# Patient Record
Sex: Female | Born: 1995 | Race: White | Hispanic: No | Marital: Single | Smoking: Former smoker
Health system: Southern US, Community
[De-identification: ages and names within clinical notes are randomized; demographics above are authoritative.]

## PROBLEM LIST (undated history)

## (undated) ENCOUNTER — Inpatient Hospital Stay (HOSPITAL_COMMUNITY): Payer: Self-pay

## (undated) DIAGNOSIS — R45851 Suicidal ideations: Secondary | ICD-10-CM

## (undated) DIAGNOSIS — F1311 Sedative, hypnotic or anxiolytic abuse, in remission: Secondary | ICD-10-CM

## (undated) DIAGNOSIS — F909 Attention-deficit hyperactivity disorder, unspecified type: Secondary | ICD-10-CM

## (undated) DIAGNOSIS — F1011 Alcohol abuse, in remission: Secondary | ICD-10-CM

## (undated) DIAGNOSIS — F4310 Post-traumatic stress disorder, unspecified: Secondary | ICD-10-CM

## (undated) DIAGNOSIS — F319 Bipolar disorder, unspecified: Secondary | ICD-10-CM

## (undated) DIAGNOSIS — T7840XA Allergy, unspecified, initial encounter: Secondary | ICD-10-CM

## (undated) DIAGNOSIS — O09299 Supervision of pregnancy with other poor reproductive or obstetric history, unspecified trimester: Secondary | ICD-10-CM

## (undated) HISTORY — PX: NO PAST SURGERIES: SHX2092

## (undated) HISTORY — DX: Bipolar disorder, unspecified: F31.9

## (undated) HISTORY — DX: Suicidal ideations: R45.851

## (undated) HISTORY — DX: Post-traumatic stress disorder, unspecified: F43.10

## (undated) HISTORY — DX: Attention-deficit hyperactivity disorder, unspecified type: F90.9

## (undated) HISTORY — DX: Allergy, unspecified, initial encounter: T78.40XA

## (undated) HISTORY — DX: Alcohol abuse, in remission: F10.11

## (undated) HISTORY — DX: Sedative, hypnotic or anxiolytic abuse, in remission: F13.11

---
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
## 2013-05-28 ENCOUNTER — Other Ambulatory Visit: Payer: Self-pay | Admitting: Family Medicine

## 2013-06-27 ENCOUNTER — Telehealth: Payer: Self-pay | Admitting: Family Medicine

## 2013-06-27 NOTE — Telephone Encounter (Signed)
Pt called and mother informed that pt is missing shots. MOther states that she will call to make appointment for shots and tb test. Wyatt Haste, RN-BSN

## 2013-06-27 NOTE — Telephone Encounter (Signed)
Pt is requesting a copy of their shot records be left up front for pickup. JW

## 2013-06-30 ENCOUNTER — Ambulatory Visit (INDEPENDENT_AMBULATORY_CARE_PROVIDER_SITE_OTHER): Payer: Medicaid Other | Admitting: *Deleted

## 2013-06-30 DIAGNOSIS — Z111 Encounter for screening for respiratory tuberculosis: Secondary | ICD-10-CM

## 2013-06-30 NOTE — Progress Notes (Signed)
PPD Placement note Veronica Moore, 17 y.o. female is here today for placement of PPD test Reason for PPD test: college Pt taken PPD test before: yes Verified in allergy area and with patient that they are not allergic to the products PPD is made of (Phenol or Tween). Yes Is patient taking any oral or IV steroid medication now or have they taken it in the last month? no Has the patient ever received the BCG vaccine?: no Has the patient been in recent contact with anyone known or suspected of having active TB disease?: no      O: Alert and oriented in NAD. P:  PPD placed on 06/30/2013.  Patient advised to return for reading within 48-72 hours.  Also dropped off forms to be filled out and will get PPD read & any needed shots on Monday at appointment with Dr. Konrad Dolores . Wyatt Haste, RN-BSN

## 2013-07-03 ENCOUNTER — Encounter: Payer: Self-pay | Admitting: Family Medicine

## 2013-07-03 ENCOUNTER — Ambulatory Visit (INDEPENDENT_AMBULATORY_CARE_PROVIDER_SITE_OTHER): Payer: Medicaid Other | Admitting: Family Medicine

## 2013-07-03 VITALS — BP 122/81 | HR 65 | Temp 98.4°F | Wt 105.0 lb

## 2013-07-03 DIAGNOSIS — F32A Depression, unspecified: Secondary | ICD-10-CM

## 2013-07-03 DIAGNOSIS — F988 Other specified behavioral and emotional disorders with onset usually occurring in childhood and adolescence: Secondary | ICD-10-CM

## 2013-07-03 DIAGNOSIS — Z23 Encounter for immunization: Secondary | ICD-10-CM

## 2013-07-03 DIAGNOSIS — Z309 Encounter for contraceptive management, unspecified: Secondary | ICD-10-CM

## 2013-07-03 DIAGNOSIS — F3289 Other specified depressive episodes: Secondary | ICD-10-CM

## 2013-07-03 DIAGNOSIS — L709 Acne, unspecified: Secondary | ICD-10-CM

## 2013-07-03 DIAGNOSIS — F329 Major depressive disorder, single episode, unspecified: Secondary | ICD-10-CM

## 2013-07-03 DIAGNOSIS — Z0000 Encounter for general adult medical examination without abnormal findings: Secondary | ICD-10-CM | POA: Insufficient documentation

## 2013-07-03 DIAGNOSIS — L708 Other acne: Secondary | ICD-10-CM

## 2013-07-03 DIAGNOSIS — Z00129 Encounter for routine child health examination without abnormal findings: Secondary | ICD-10-CM

## 2013-07-03 DIAGNOSIS — IMO0001 Reserved for inherently not codable concepts without codable children: Secondary | ICD-10-CM

## 2013-07-03 MED ORDER — METHYLPHENIDATE HCL ER (OSM) 18 MG PO TBCR: 18.0000 mg | EXTENDED_RELEASE_TABLET | Freq: Every day | ORAL | Status: DC

## 2013-07-03 MED ORDER — ADAPALENE 0.1 % EX CREA: TOPICAL_CREAM | Freq: Every day | CUTANEOUS | Status: DC

## 2013-07-03 NOTE — Progress Notes (Signed)
  Subjective:     History was provided by the mother and Pt.  Veronica Moore is a 17 y.o. female who is here for this wellness visit.   Current Issues: Current concerns include:None  Acne: using spot benzoyl peroxide PRN. Worse w/ periods.  Depression: no current therapy. Healthy home and friend relationships. No complaints. Denies HI/SI  ADD: not taking during the summer. Only takes during school year. Needs refills.    H (Home) Family Relationships: good Communication: good with parents Responsibilities: has responsibilities at home  E (Education): Grades: As and Bs School: good attendance Future Plans: college, Arts administrator   A (Activities) Sports: no sports Exercise: Yes  Activities: drama Friends: Yes   A (Auton/Safety) Auto: wears seat belt Bike: does not ride Safety: can swim  D (Diet) Diet: balanced diet Risky eating habits: none Intake: adequate iron and calcium intake Body Image: positive body image  Drugs Tobacco: No Alcohol: No Drugs: No  Sex Activity: sexually active  Suicide Risk Emotions: healthy Depression: denies feelings of depression Suicidal: denies suicidal ideation     Objective:     Filed Vitals:   07/03/13 1030  BP: 122/81  Pulse: 65  Temp: 98.4 F (36.9 C)  TempSrc: Oral  Weight: 105 lb (47.628 kg)   Growth parameters are noted and are appropriate for age.  General:   alert and cooperative  Gait:   normal  Skin:   closed and open commodomal acne on face  Oral cavity:   lips, mucosa, and tongue normal; teeth and gums normal  Eyes:   sclerae white, pupils equal and reactive, red reflex normal bilaterally  Ears:   normal bilaterally  Neck:   normal  Lungs:  clear to auscultation bilaterally  Heart:   regular rate and rhythm, S1, S2 normal, no murmur, click, rub or gallop  Abdomen:  soft, non-tender; bowel sounds normal; no masses,  no organomegaly  GU:  not examined  Extremities:   extremities normal, atraumatic, no  cyanosis or edema  Neuro:  normal without focal findings, mental status, speech normal, alert and oriented x3 and PERLA     Assessment:    Healthy 17 y.o. female child.    Plan:   1. Anticipatory guidance discussed. Nutrition, Physical activity, Behavior, Emergency Care, Sick Care, Safety and Handout given  2. Follow-up visit in 12 months for next wellness visit, or sooner as needed.   3. Spent considerable time discussing healthy eating habits, safe sex, depression, and Acne. Pt w/ excellent support at home. Appears to be stable from a nutrtion standpoint. Reviewed healthy eating habits, and exercise. Counseled on safe sex as not using any barrier method to prevent STD. No signs of worsening depression. Acne - see problem list

## 2013-07-03 NOTE — Patient Instructions (Addendum)
You are doing great Congratulations on getting into [employer]. Please start using the Differin cream at night. You may continue using the Bonzoyl peroxide treatments during the day Please call if this is not working for the acne or if you need anything else Best of luck.

## 2013-07-03 NOTE — Assessment & Plan Note (Signed)
Doesn't take during summer. Will start back up PRN once starting at [employer] [facility] [employer] [facility]

## 2013-07-03 NOTE — Assessment & Plan Note (Signed)
Varicella titer for school.

## 2013-07-03 NOTE — Assessment & Plan Note (Signed)
Cont OCP

## 2013-07-03 NOTE — Assessment & Plan Note (Signed)
Cont Benzoyl peroxide. Would like better control Will start Differin.

## 2013-07-03 NOTE — Assessment & Plan Note (Signed)
Pt starting [employer] this fall Discussed warning signs of depression.  Pt in longterm relationship with BF who is also going to [employer] Pt w/ excellent support at home.  Pt reassured that she can come in if any concerns arise.  No Rx at this time

## 2013-07-04 ENCOUNTER — Telehealth: Payer: Self-pay | Admitting: *Deleted

## 2013-07-04 LAB — VARICELLA ZOSTER ANTIBODY, IGG: Varicella IgG: 1446 Index — ABNORMAL HIGH (ref <135.00)

## 2013-07-04 NOTE — Telephone Encounter (Signed)
[facility]alene .Form placed in doctor's box for completion, attached are the preferred drugs. Will have md return form after completion to me. Wyatt Haste, RN-BSN

## 2013-07-06 MED ORDER — ADAPALENE 0.1 % EX CREA: TOPICAL_CREAM | Freq: Every day | CUTANEOUS | Status: DC

## 2013-07-06 NOTE — Addendum Note (Signed)
Addended by: Ozella Rocks on: 07/06/2013 12:53 PM   Modules accepted: Orders

## 2013-07-06 NOTE — Telephone Encounter (Signed)
Waiting to receive back from MD. Wyatt Haste, RN-BSN

## 2013-07-06 NOTE — Telephone Encounter (Signed)
Mom is calling to check the status of the auth for this medication.

## 2013-07-07 NOTE — Telephone Encounter (Signed)
Mother called and informed. Wyatt Haste, RN-BSN

## 2013-07-07 NOTE — Telephone Encounter (Signed)
Sent new Rx for brand name Deffirin as this is covered by Medicaid.  Sent on 07/05/13.  Should be at pharmacy

## 2013-10-24 ENCOUNTER — Encounter: Payer: Self-pay | Admitting: Emergency Medicine

## 2015-12-02 ENCOUNTER — Emergency Department (HOSPITAL_COMMUNITY)
Admission: EM | Admit: 2015-12-02 | Discharge: 2015-12-02 | Disposition: A | Discharge status: Other Acute Inpatient Hospital | Payer: Medicaid Other | Attending: Emergency Medicine | Admitting: Emergency Medicine

## 2015-12-02 ENCOUNTER — Encounter (HOSPITAL_COMMUNITY): Payer: Self-pay | Admitting: *Deleted

## 2015-12-02 ENCOUNTER — Encounter (HOSPITAL_COMMUNITY): Payer: Self-pay | Admitting: Emergency Medicine

## 2015-12-02 ENCOUNTER — Inpatient Hospital Stay (HOSPITAL_COMMUNITY)
Admission: AD | Admit: 2015-12-02 | Discharge: 2015-12-10 | DRG: 885 | Disposition: A | Discharge status: Home / Self Care | Payer: BC Managed Care – PPO | Attending: Psychiatry | Admitting: Psychiatry

## 2015-12-02 DIAGNOSIS — F332 Major depressive disorder, recurrent severe without psychotic features: Secondary | ICD-10-CM | POA: Diagnosis present

## 2015-12-02 DIAGNOSIS — F419 Anxiety disorder, unspecified: Secondary | ICD-10-CM | POA: Diagnosis present

## 2015-12-02 DIAGNOSIS — F4310 Post-traumatic stress disorder, unspecified: Secondary | ICD-10-CM | POA: Diagnosis present

## 2015-12-02 DIAGNOSIS — F1210 Cannabis abuse, uncomplicated: Secondary | ICD-10-CM | POA: Diagnosis present

## 2015-12-02 DIAGNOSIS — F17210 Nicotine dependence, cigarettes, uncomplicated: Secondary | ICD-10-CM | POA: Diagnosis present

## 2015-12-02 DIAGNOSIS — Z818 Family history of other mental and behavioral disorders: Secondary | ICD-10-CM

## 2015-12-02 DIAGNOSIS — F1010 Alcohol abuse, uncomplicated: Secondary | ICD-10-CM | POA: Diagnosis present

## 2015-12-02 DIAGNOSIS — Y9389 Activity, other specified: Secondary | ICD-10-CM | POA: Insufficient documentation

## 2015-12-02 DIAGNOSIS — R45851 Suicidal ideations: Secondary | ICD-10-CM | POA: Insufficient documentation

## 2015-12-02 DIAGNOSIS — Y998 Other external cause status: Secondary | ICD-10-CM | POA: Insufficient documentation

## 2015-12-02 DIAGNOSIS — Y9289 Other specified places as the place of occurrence of the external cause: Secondary | ICD-10-CM | POA: Insufficient documentation

## 2015-12-02 DIAGNOSIS — F1310 Sedative, hypnotic or anxiolytic abuse, uncomplicated: Secondary | ICD-10-CM | POA: Diagnosis present

## 2015-12-02 DIAGNOSIS — F909 Attention-deficit hyperactivity disorder, unspecified type: Secondary | ICD-10-CM | POA: Insufficient documentation

## 2015-12-02 DIAGNOSIS — F1910 Other psychoactive substance abuse, uncomplicated: Secondary | ICD-10-CM

## 2015-12-02 DIAGNOSIS — G4700 Insomnia, unspecified: Secondary | ICD-10-CM | POA: Diagnosis present

## 2015-12-02 DIAGNOSIS — T424X2A Poisoning by benzodiazepines, intentional self-harm, initial encounter: Secondary | ICD-10-CM | POA: Insufficient documentation

## 2015-12-02 DIAGNOSIS — F410 Panic disorder [episodic paroxysmal anxiety] without agoraphobia: Secondary | ICD-10-CM | POA: Diagnosis present

## 2015-12-02 DIAGNOSIS — F329 Major depressive disorder, single episode, unspecified: Secondary | ICD-10-CM | POA: Insufficient documentation

## 2015-12-02 LAB — CBC
HCT: 41.7 % (ref 36.0–46.0)
Hemoglobin: 13.9 g/dL (ref 12.0–15.0)
MCH: 31 pg (ref 26.0–34.0)
MCHC: 33.3 g/dL (ref 30.0–36.0)
MCV: 92.9 fL (ref 78.0–100.0)
Platelets: 253 10*3/uL (ref 150–400)
RBC: 4.49 MIL/uL (ref 3.87–5.11)
RDW: 12.8 % (ref 11.5–15.5)
WBC: 10.9 10*3/uL — ABNORMAL HIGH (ref 4.0–10.5)

## 2015-12-02 LAB — COMPREHENSIVE METABOLIC PANEL
ALT: 14 U/L (ref 14–54)
AST: 16 U/L (ref 15–41)
Albumin: 4.7 g/dL (ref 3.5–5.0)
Alkaline Phosphatase: 69 U/L (ref 38–126)
Anion gap: 10 (ref 5–15)
BUN: 14 mg/dL (ref 6–20)
CO2: 25 mmol/L (ref 22–32)
Calcium: 9.6 mg/dL (ref 8.9–10.3)
Chloride: 106 mmol/L (ref 101–111)
Creatinine, Ser: 0.69 mg/dL (ref 0.44–1.00)
GFR calc Af Amer: >60 mL/min (ref >60)
GFR calc non Af Amer: >60 mL/min (ref >60)
Glucose, Bld: 96 mg/dL (ref 65–99)
Potassium: 3.9 mmol/L (ref 3.5–5.1)
Sodium: 141 mmol/L (ref 135–145)
Total Bilirubin: 0.7 mg/dL (ref 0.3–1.2)
Total Protein: 7.9 g/dL (ref 6.5–8.1)

## 2015-12-02 LAB — ETHANOL: Alcohol, Ethyl (B): <5 mg/dL (ref <5)

## 2015-12-02 LAB — RAPID URINE DRUG SCREEN, HOSP PERFORMED
Amphetamines: NOT DETECTED
Barbiturates: NOT DETECTED
Benzodiazepines: NOT DETECTED
Cocaine: NOT DETECTED
Opiates: NOT DETECTED
Tetrahydrocannabinol: POSITIVE — AB

## 2015-12-02 LAB — ACETAMINOPHEN LEVEL: Acetaminophen (Tylenol), Serum: <10 ug/mL — ABNORMAL LOW (ref 10–30)

## 2015-12-02 LAB — POC URINE PREG, ED: Preg Test, Ur: NEGATIVE

## 2015-12-02 LAB — SALICYLATE LEVEL: Salicylate Lvl: <4 mg/dL (ref 2.8–30.0)

## 2015-12-02 NOTE — ED Notes (Signed)
Per pt, states she is suicidal-here for medical clearance[facility]

## 2015-12-02 NOTE — ED Provider Notes (Signed)
CSN: [id number]     Arrival date & time 12/02/15  1615 History   First MD Initiated Contact with Patient 12/02/15 1656     Chief Complaint  Patient presents with  . Medical Clearance     (Consider location/radiation/quality/duration/timing/severity/associated sxs/prior Treatment) The history is provided by the patient.     This is a 20 year old female who presents the emergency department after a suicide attempt. The patient states that she has been depressed "my entire life." Last week. The patient took more than 20 benzodiazepines and drink half a fifth of liquor in an attempt to kill herself. She states that her brother took care of her and she is not successful in her attempt. The patient woke up this morning before any of the rest of her family and tried to take the keys to unlock the colon in her father's truck. She was stopped by her family who said that she could either go voluntarily for psychiatric help for commit her. She states that she has tried to hurt herself previously, but does not give me specific details of previous suicide attempts. She has never been hospitalized for previous depression or suicide attempt. She admits to daily alcohol abuse and states she drinks at least a sixpack of beer or a half a fifth of liquor daily. She has been doing this for many years. She has a history of polysubstance abuse including heroin, but states that she has not used any in many months. She has a family history of substance abuse. She denies a family history of completed suicide. She admits to a history of abuse. She denies homicidal ideation or audiovisual hallucination. The patient states that she does get shaky if she does not drink. Her last alcohol intake was 12 hours ago  Past Medical History  Diagnosis Date  . ADHD (attention deficit hyperactivity disorder)   . Allergy    History reviewed. No pertinent past surgical history. No family history on file. Social History  Substance Use  Topics  . Smoking status: Never Smoker   . Smokeless tobacco: None  . Alcohol Use: No   OB History    No data available     Review of Systems   Ten systems reviewed and are negative for acute change, except as noted in the HPI.   Allergies  Review of patient's allergies indicates no known allergies.  Home Medications   Prior to Admission medications   Medication Sig Start Date End Date Taking? Authorizing Provider  Acetaminophen-DM (COUGH & SORE THROAT DAY) 1000-30 MG/30ML LIQD Take 30 mLs by mouth daily as needed (cold symptoms).   Yes Historical Provider, MD  adapalene (DIFFERIN) 0.1 % cream Apply topically at bedtime. Wash off in the morning Patient not taking: Reported on 12/02/2015 07/06/13   Ozella Rocks, MD  methylphenidate (CONCERTA) 18 MG CR tablet Take 1 tablet (18 mg total) by mouth daily. Do not fill until 60 days after prescription date. 07/03/13 07/03/14  Ozella Rocks, MD  methylphenidate (CONCERTA) 18 MG CR tablet Take 1 tablet (18 mg total) by mouth daily. Patient not taking: Reported on 12/02/2015 07/03/13   Ozella Rocks, MD  methylphenidate (CONCERTA) 18 MG CR tablet Take 1 tablet (18 mg total) by mouth daily. Do not fill for 30 days past prescription date 07/03/13 07/03/14  Ozella Rocks, MD  norgestimate-ethinyl estradiol (ORTHO-CYCLEN,SPRINTEC,PREVIFEM) 0.25-35 MG-MCG tablet TAKE 1 TABLET BY MOUTH EVERY DAY Patient not taking: Reported on 12/02/2015 05/28/13   Ozella Rocks,  MD   LMP  Physical Exam  Constitutional: She is oriented to person, place, and time. She appears well-developed and well-nourished. No distress.  HENT:  Head: Normocephalic and atraumatic.  Eyes: Conjunctivae are normal. No scleral icterus.  Neck: Normal range of motion.  Cardiovascular: Normal rate, regular rhythm and normal heart sounds.  Exam reveals no gallop and no friction rub.   No murmur heard. Pulmonary/Chest: Effort normal and breath sounds normal. No respiratory distress.   Abdominal: Soft. Bowel sounds are normal. She exhibits no distension and no mass. There is no tenderness. There is no guarding.  Neurological: She is alert and oriented to person, place, and time.  Skin: Skin is warm and dry. She is not diaphoretic.  Psychiatric: Her speech is delayed. She is slowed. She exhibits a depressed mood. She expresses suicidal ideation. She expresses suicidal plans.  Tearful, flat affect  Nursing note and vitals reviewed.   ED Course  Procedures (including critical care time) Labs Review Labs Reviewed  CBC - Abnormal; Notable for the following:    WBC 10.9 (*)    All other components within normal limits  URINE RAPID DRUG SCREEN, HOSP PERFORMED - Abnormal; Notable for the following:    Tetrahydrocannabinol POSITIVE (*)    All other components within normal limits  ACETAMINOPHEN LEVEL - Abnormal; Notable for the following:    Acetaminophen (Tylenol), Serum <10 (*)    All other components within normal limits  COMPREHENSIVE METABOLIC PANEL  ETHANOL  SALICYLATE LEVEL  POC URINE PREG, ED    Imaging Review No results found. I have personally reviewed and evaluated these images and lab results as part of my medical decision-making.   EKG Interpretation None      MDM   Final diagnoses:  Suicidal intent  Polysubstance abuse    Patient appears medically clear. [facility]   Arthor CaptainAbigail Harris, PA-C 12/03/15 [id number]  Mancel BaleElliott Wentz, MD 12/04/15 1248

## 2015-12-02 NOTE — BH Assessment (Signed)
Tele Assessment Note   Veronica Moore is an 20 y.o. female presenting this date with her father who brought her to [facility] due to patient having SI with a plan to harm herself. Collateral information from father states patient has not been out of her bed in two days and has not eaten. Patient was found this morning by her father in her room stating she had thoughts of self harm and was going to overdose on drugs. Patient states she has been depressed for the last month and rated her depression at a 10 at the time of this assessment. Patient reports ongoing anxiety rating her symptoms at a 8 at the time of assessment. Patient reports ongoing SA issues and daily use of THC where patient reports that she is using up to 3 grams of Cannabis daily and also using alcohol (3or 4 8 oz. Beers) three to four times a week with last use being 11/30/15 where she reported consuming 3 8 oz. beers. Patient also has a history of IV cocaine use reporting last use 3 months ago, but denies use since then. Patient reports a history of depression and SA abuse in her family with patient stating she attended outpatient treatment with her mother in 2014 where she attended groups for three weeks. Patient denies any MH diagnosis but did state she felt she has ADHD and has taken medication in the past. Patient denies taking any current medication/s but is open to medication interventions to assist with depression/anxiety. Patient has had one noted suicide attempt in 2015 where she admits to cutting her right inner thigh while taking a shower bur her partner at that time stopped her. No interventions were noted and she did not seek treatment at that time. Patient is open to voluntary admission and case was staffed with Rosana Hoes NP who agreed patient met criteria for an inpatient admission. [facility] was contacted informing staff patient was being transported to ED for medical clearance and then will be admitted to [facility] 303-2 per [facility] M.D.Marland Kitchen              Diagnosis:  Axis I: 309.28 Depression with mixed Anxiety, 304.80 Polysubstance Use                    Axis II: Deferred                    Axis III: Deferred                    Axis IV: Problems with primary support                    Axis V: 30  Past Medical History:  Past Medical History  Diagnosis Date  . ADHD (attention deficit hyperactivity disorder)   . Allergy     No past surgical history on file.  Family History: No family history on file.  Social History:  reports that she has never smoked. She does not have any smokeless tobacco history on file. She reports that she does not drink alcohol or use illicit drugs.  Additional Social History:  Alcohol / Drug Use Pain Medications: See MAR Prescriptions: See MAR Over the Counter: See MAR History of alcohol / drug use?: Yes Longest period of sobriety (when/how long): 3 months 2015 Withdrawal Symptoms: Agitation Substance #1 Name of Substance 1: Alcohol  1 - Age of First Use: 13 1 - Amount (size/oz): 2 to 3 8oz. beers three to  four times a week. 1 - Frequency: Three to four times a week 1 - Duration: Last year 1 - Last Use / Amount: 11/30/15 Patient reports drinking three 8oz. beers.  Substance #2 Name of Substance 2: Xanax 2 - Age of First Use: 18 2 - Amount (size/oz): 32m daily or more if patient can acquire them    2 - Frequency: 3 to 4 times a week   2 - Duration: last year 2 - Last Use / Amount: 11/30/15 patient reports using 136mSubstance #3 Name of Substance 3: THC 3 - Age of First Use: 13 3 - Amount (size/oz): 3 grams 3 - Frequency: daily 3 - Duration: last two years 3 - Last Use / Amount: this date patient reported using 2 grams  CIWA:   COWS:    PATIENT STRENGTHS: (choose at least two) Ability for insight Motivation for treatment/growth  Allergies: No Known Allergies  Home Medications:  (Not in a hospital admission)  OB/GYN Status:  No LMP recorded.  General Assessment Data [facility] Assessment: In system Is this a Tele or Face-to-Face Assessment?: Face-to-Face Is this an Initial Assessment or a Re-assessment for this encounter?: Initial Assessment Marital status: Single Maiden name: na Is patient pregnant?: No Pregnancy Status: No Living Arrangements: Parent Can pt return to current living arrangement?: Yes Admission Status: Voluntary Is patient capable of signing voluntary admission?: Yes Referral Source: Self/Family/Friend Insurance type: BlMedical laboratory scientific officerxam (BHShow LowMedical Exam completed: Yes  Crisis Care Plan Living Arrangements: Parent Legal Guardian: Other: (na) Name of Psychiatrist: None Name of Therapist: None  Education Status Is patient currently in school?: No Current Grade: na Highest grade of school patient has completed: some college Name of school: [employer] person: na  Risk to self with the past 6 months Suicidal Ideation: Yes-Currently Present Has patient been a risk to self within the past 6 months prior to admission? : Yes Suicidal Intent: Yes-Currently Present Has patient had any suicidal intent within the past 6 months prior to admission? : Yes Is patient at risk for suicide?: Yes Suicidal Plan?: Yes-Currently Present Has patient had any suicidal plan within the past 6 months prior to admission? : Yes Specify Current Suicidal Plan: Patient stated she was going to overdose Access to Means: Yes Specify Access to Suicidal Means: Patient says she can get medication from friend What has been your use of drugs/alcohol within the last 12 months?: Daily THC use Previous Attempts/Gestures: Yes How many times?: 2 Other Self Harm Risks: none Triggers for Past Attempts: Unknown Intentional Self Injurious Behavior: None Family Suicide History: No Recent stressful life event(s): Other (Comment) (relationship issues) Persecutory voices/beliefs?: No Depression:  Yes Depression Symptoms: Despondent, Insomnia Substance abuse history and/or treatment for substance abuse?: No Suicide prevention information given to non-admitted patients: Not applicable  Risk to Others within the past 6 months Homicidal Ideation: No Does patient have any lifetime risk of violence toward others beyond the six months prior to admission? : No Thoughts of Harm to Others: No Current Homicidal Intent: No Current Homicidal Plan: No Access to Homicidal Means: No Identified Victim: na History of harm to others?: No Assessment of Violence: None Noted Violent Behavior Description: na Does patient have access to weapons?: No Criminal Charges Pending?: No Does patient have a court date: No Is patient on probation?: No  Psychosis Hallucinations: None noted Delusions: None noted  Mental Status Report Appearance/Hygiene: Disheveled Eye Contact: Poor Motor  Activity: Unremarkable Speech: Soft Level of Consciousness: Alert Mood: Depressed Affect: Depressed Anxiety Level: Moderate Thought Processes: Coherent, Relevant Judgement: Unimpaired Orientation: Person, Place, Time Obsessive Compulsive Thoughts/Behaviors: None  Cognitive Functioning Concentration: Normal Memory: Recent Intact, Remote Intact IQ: Average Insight: Fair Impulse Control: Fair Appetite: Poor Weight Loss: 20 Weight Gain: 0 Sleep: Decreased Total Hours of Sleep: 2 Vegetative Symptoms: Staying in bed  ADLScreening [facility] Assessment Services) Patient's cognitive ability adequate to safely complete daily activities?: Yes Patient able to express need for assistance with ADLs?: Yes Independently performs ADLs?: Yes (appropriate for developmental age)  Prior Inpatient Therapy Prior Inpatient Therapy: No Prior Therapy Dates: na Prior Therapy Facilty/Provider(s): none Reason for Treatment: na  Prior Outpatient Therapy Prior Outpatient Therapy: Yes Prior Therapy Dates: 2014 Prior Therapy  Facilty/Provider(s): [facility] Reason for Treatment: SA use, depression Does patient have an ACCT team?: No Does patient have Intensive In-House Services?  : No Does patient have [facility] services? : No Does patient have P4CC services?: No  ADL Screening (condition at time of admission) Patient's cognitive ability adequate to safely complete daily activities?: Yes Is the patient deaf or have difficulty hearing?: No Does the patient have difficulty seeing, even when wearing glasses/contacts?: No Does the patient have difficulty concentrating, remembering, or making decisions?: No Patient able to express need for assistance with ADLs?: Yes Does the patient have difficulty dressing or bathing?: No Independently performs ADLs?: Yes (appropriate for developmental age) Does the patient have difficulty walking or climbing stairs?: No Weakness of Legs: None Weakness of Arms/Hands: None  Home Assistive Devices/Equipment Home Assistive Devices/Equipment: None  Therapy Consults (therapy consults require a physician order) PT Evaluation Needed: No OT Evalulation Needed: No SLP Evaluation Needed: No Abuse/Neglect Assessment (Assessment to be complete while patient is alone) Physical Abuse: Denies Verbal Abuse: Denies Sexual Abuse: Denies Exploitation of patient/patient's resources: Denies Self-Neglect: Denies Values / Beliefs Cultural Requests During Hospitalization: None Spiritual Requests During Hospitalization: None Consults Spiritual Care Consult Needed: No Social Work Consult Needed: No Regulatory affairs officer (For Healthcare) Does patient have an advance directive?: No    Additional Information 1:1 In Past 12 Months?: No CIRT Risk: No Elopement Risk: No Does patient have medical clearance?: Yes     Disposition: Patient is open to voluntary admission and case was staffed with Rosana Hoes NP who agreed patient met criteria for an inpatient admission. [facility] was contacted informing  staff patient was being transported to ED for medical clearance and then will be admitted to [facility] 303-2 per [facility] M.D. Disposition Initial Assessment Completed for this Encounter: Yes Disposition of Patient: Inpatient treatment program Type of inpatient treatment program: Adult  Mamie Nick 12/02/2015 3:58 PM

## 2015-12-02 NOTE — Tx Team (Signed)
Initial Interdisciplinary Treatment Plan   PATIENT STRESSORS: Financial difficulties Health problems Loss of Grandmother and aunt Marital or family conflict Medication change or noncompliance Substance abuse   PATIENT STRENGTHS: Average or above average intelligence Capable of independent living General fund of knowledge Motivation for treatment/growth Physical Health Supportive family/friends Work skills   PROBLEM LIST: Problem List/Patient Goals Date to be addressed Date deferred Reason deferred Estimated date of resolution  "Provide a safe environment where I can be safe from myself' 12/02/15     "Learn how to cope with living" 12/02/15           Increased risk for suicide 12/02/15     Substance abuse 12/02/15     Depression 12/02/15                        DISCHARGE CRITERIA:  Ability to meet basic life and health needs Adequate post-discharge living arrangements Improved stabilization in mood, thinking, and/or behavior Medical problems require only outpatient monitoring Motivation to continue treatment in a less acute level of care Need for constant or close observation no longer present Reduction of life-threatening or endangering symptoms to within safe limits Safe-care adequate arrangements made Verbal commitment to aftercare and medication compliance Withdrawal symptoms are absent or subacute and managed without 24-hour nursing intervention  PRELIMINARY DISCHARGE PLAN: Outpatient therapy Participate in family therapy Return to previous living arrangement Return to previous work or school arrangements  PATIENT/FAMIILY INVOLVEMENT: This treatment plan has been presented to and reviewed with the patient, Veronica Moore, and/or family member.  The patient and family have been given the opportunity to ask questions and make suggestions.  Fransico MichaelBrooks, Kim Laverne 12/02/2015, 11:00 PM

## 2015-12-03 ENCOUNTER — Encounter (HOSPITAL_COMMUNITY): Payer: Self-pay | Admitting: Psychiatry

## 2015-12-03 DIAGNOSIS — R45851 Suicidal ideations: Secondary | ICD-10-CM

## 2015-12-03 DIAGNOSIS — F1010 Alcohol abuse, uncomplicated: Secondary | ICD-10-CM | POA: Diagnosis present

## 2015-12-03 DIAGNOSIS — F332 Major depressive disorder, recurrent severe without psychotic features: Secondary | ICD-10-CM | POA: Diagnosis present

## 2015-12-03 DIAGNOSIS — F1310 Sedative, hypnotic or anxiolytic abuse, uncomplicated: Secondary | ICD-10-CM | POA: Diagnosis present

## 2015-12-03 DIAGNOSIS — F4310 Post-traumatic stress disorder, unspecified: Secondary | ICD-10-CM | POA: Diagnosis present

## 2015-12-03 LAB — RAPID STREP SCREEN (MED CTR MEBANE ONLY): Streptococcus, Group A Screen (Direct): NEGATIVE

## 2015-12-03 MED ORDER — LORAZEPAM 1 MG PO TABS
1.0000 mg | ORAL_TABLET | Freq: Every day | ORAL | Status: AC
  Administered 2015-12-06: 1 mg via ORAL
  Filled 2015-12-03: qty 1

## 2015-12-03 MED ORDER — ONDANSETRON 4 MG PO TBDP
4.0000 mg | ORAL_TABLET | Freq: Four times a day (QID) | ORAL | Status: AC | PRN
  Filled 2015-12-03: qty 1

## 2015-12-03 MED ORDER — VITAMIN B-1 100 MG PO TABS
100.0000 mg | ORAL_TABLET | Freq: Every day | ORAL | Status: DC
  Administered 2015-12-04 – 2015-12-10 (×7): 100 mg via ORAL
  Filled 2015-12-03 (×9): qty 1

## 2015-12-03 MED ORDER — LORAZEPAM 1 MG PO TABS
1.0000 mg | ORAL_TABLET | Freq: Two times a day (BID) | ORAL | Status: AC
  Administered 2015-12-05 (×2): 1 mg via ORAL
  Filled 2015-12-03 (×2): qty 1

## 2015-12-03 MED ORDER — TRAZODONE HCL 50 MG PO TABS
50.0000 mg | ORAL_TABLET | Freq: Every evening | ORAL | Status: DC | PRN
  Administered 2015-12-03 – 2015-12-05 (×3): 50 mg via ORAL
  Filled 2015-12-03 (×3): qty 1

## 2015-12-03 MED ORDER — PNEUMOCOCCAL VAC POLYVALENT 25 MCG/0.5ML IJ INJ: 0.5000 mL | INJECTION | INTRAMUSCULAR | Status: DC

## 2015-12-03 MED ORDER — LOPERAMIDE HCL 2 MG PO CAPS: 2.0000 mg | ORAL_CAPSULE | ORAL | Status: AC | PRN

## 2015-12-03 MED ORDER — ADULT MULTIVITAMIN W/MINERALS CH
1.0000 | ORAL_TABLET | Freq: Every day | ORAL | Status: DC
  Administered 2015-12-03 – 2015-12-10 (×8): 1 via ORAL
  Filled 2015-12-03 (×11): qty 1

## 2015-12-03 MED ORDER — PHENOL 1.4 % MT LIQD
1.0000 | Freq: Four times a day (QID) | OROMUCOSAL | Status: DC | PRN
Start: 2015-12-03 — End: 2015-12-10
  Filled 2015-12-03: qty 177

## 2015-12-03 MED ORDER — HYDROXYZINE HCL 25 MG PO TABS
25.0000 mg | ORAL_TABLET | Freq: Four times a day (QID) | ORAL | Status: AC | PRN
  Administered 2015-12-04 – 2015-12-05 (×2): 25 mg via ORAL
  Filled 2015-12-03 (×2): qty 1

## 2015-12-03 MED ORDER — GUAIFENESIN ER 600 MG PO TB12
600.0000 mg | ORAL_TABLET | Freq: Two times a day (BID) | ORAL | Status: DC
  Administered 2015-12-03 – 2015-12-10 (×14): 600 mg via ORAL
  Filled 2015-12-03 (×18): qty 1

## 2015-12-03 MED ORDER — LORAZEPAM 1 MG PO TABS
1.0000 mg | ORAL_TABLET | Freq: Four times a day (QID) | ORAL | Status: AC
  Administered 2015-12-03 (×2): 1 mg via ORAL
  Filled 2015-12-03 (×2): qty 1

## 2015-12-03 MED ORDER — NICOTINE 21 MG/24HR TD PT24
21.0000 mg | MEDICATED_PATCH | Freq: Every day | TRANSDERMAL | Status: DC
  Administered 2015-12-03 – 2015-12-10 (×8): 21 mg via TRANSDERMAL
  Filled 2015-12-03 (×11): qty 1

## 2015-12-03 MED ORDER — LORAZEPAM 1 MG PO TABS
1.0000 mg | ORAL_TABLET | Freq: Three times a day (TID) | ORAL | Status: AC
  Administered 2015-12-04 (×3): 1 mg via ORAL
  Filled 2015-12-03 (×4): qty 1

## 2015-12-03 MED ORDER — MAGNESIUM HYDROXIDE 400 MG/5ML PO SUSP: 30.0000 mL | Freq: Every day | ORAL | Status: DC | PRN

## 2015-12-03 MED ORDER — LORAZEPAM 1 MG PO TABS
1.0000 mg | ORAL_TABLET | Freq: Four times a day (QID) | ORAL | Status: AC | PRN
  Administered 2015-12-03 – 2015-12-04 (×2): 1 mg via ORAL
  Filled 2015-12-03: qty 1

## 2015-12-03 MED ORDER — ACETAMINOPHEN 325 MG PO TABS
650.0000 mg | ORAL_TABLET | Freq: Four times a day (QID) | ORAL | Status: DC | PRN
  Administered 2015-12-03 – 2015-12-10 (×2): 650 mg via ORAL
  Filled 2015-12-03 (×2): qty 2

## 2015-12-03 MED ORDER — AMOXICILLIN-POT CLAVULANATE 875-125 MG PO TABS
1.0000 | ORAL_TABLET | Freq: Two times a day (BID) | ORAL | Status: DC
  Administered 2015-12-03 – 2015-12-10 (×15): 1 via ORAL
  Filled 2015-12-03 (×20): qty 1

## 2015-12-03 MED ORDER — INFLUENZA VAC SPLIT QUAD 0.5 ML IM SUSY
0.5000 mL | PREFILLED_SYRINGE | INTRAMUSCULAR | Status: DC
  Filled 2015-12-03: qty 0.5

## 2015-12-03 MED ORDER — ALUM & MAG HYDROXIDE-SIMETH 200-200-20 MG/5ML PO SUSP: 30.0000 mL | ORAL | Status: DC | PRN
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
## 2015-12-03 NOTE — [facility] Counselor (Signed)
CSW attempted to complete PSA this morning. Pt stated that she was not feeling good and wanted to lay down.  CSW to try again later.  Trula SladeHeather Smart, MSW, LCSW Clinical Social Worker 12/03/2015 11:22 AM
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
## 2015-12-03 NOTE — Tx Team (Signed)
Interdisciplinary Treatment Plan Update (Adult)  Date:  12/03/2015  Time Reviewed:  8:29 AM   Progress in Treatment: Attending groups: No. New to unit. Continuing to assess.  Participating in groups:  No. Taking medication as prescribed:  Yes. Tolerating medication:  Yes. Family/Significant othe contact made:  SPE required for this pt.  Patient understands diagnosis:  Yes. and As evidenced by:  seeking treatment for anxiety/depression, medication stabilization, ETOH/THC abuse. Discussing patient identified problems/goals with staff:  Yes. Medical problems stabilized or resolved:  Yes. Denies suicidal/homicidal ideation: Yes. Issues/concerns per patient self-inventory:  Other:    Discharge Plan or Barriers:  CSW assessing for appropriate referrals. Pt has no current providers.   Reason for Continuation of Hospitalization: Anxiety Depression Medication stabilization Withdrawal symptoms  Comments:  Veronica Moore is an 20 y.o. female presenting this date with her father who brought her to [facility] due to patient having SI with a plan to harm herself. Collateral information from father states patient has not been out of her bed in two days and has not eaten. Patient was found this morning by her father in her room stating she had thoughts of self harm and was going to overdose on drugs. Patient states she has been depressed for the last month and rated her depression at a 10 at the time of this assessment. Patient reports ongoing anxiety rating her symptoms at a 8 at the time of assessment. Patient reports ongoing SA issues and daily use of THC where patient reports that she is using up to 3 grams of Cannabis daily and also using alcohol (3or 4 8 oz. Beers) three to four times a week with last use being 11/30/15 where she reported consuming 3 8 oz. beers. Patient also has a history of IV cocaine use reporting last use 3 months ago, but denies use since then. Patient reports a history of depression  and SA abuse in her family with patient stating she attended outpatient treatment with her mother in 2014 where she attended groups for three weeks. Patient denies any MH diagnosis but did state she felt she has ADHD and has taken medication in the past. Patient denies taking any current medication/s but is open to medication interventions to assist with depression/anxiety. Patient has had one noted suicide attempt in 2015 where she admits to cutting her right inner thigh while taking a shower bur her partner at that time stopped her. 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   Additional Comments:  Patient and CSW reviewed pt's identified goals and treatment plan. Patient verbalized understanding and agreed to treatment plan. CSW reviewed [facility] "Discharge Process and Patient Involvement" Form. Pt verbalized understanding of information provided and signed form.    Review of initial/current patient goals per problem list:  1. Goal(s): Patient will participate in aftercare plan  Met: No.   Target date: at discharge  As evidenced by: Patient will participate within aftercare plan AEB aftercare provider and housing plan at discharge being identified.  12/03/15: CSW assessing for appropriate referrals.   2. Goal (s): Patient will exhibit decreased depressive symptoms and suicidal ideations.  Met: No.    Target date: at discharge  As evidenced by: Patient will utilize self rating of depression at 3 or below and demonstrate decreased signs of depression or be deemed  stable for discharge by MD.  12/02/14: Pt rates depression as high. Denies SI/HI/AVH today.   3. Goal(s): Patient will demonstrate decreased signs and symptoms of anxiety.  Met:No.   Target date: at discharge  As evidenced  by: Patient will utilize self rating of anxiety at 3 or below and demonstrated decreased signs of anxiety, or be deemed stable for discharge by MD  03/02/16: Pt rates anxiety as high today.   4. Goal(s): Patient will demonstrate decreased signs of withdrawal due to substance abuse  Met: Goal progressing.   Target date:at discharge   As evidenced by: Patient will produce a CIWA/COWS score of 0, have stable vitals signs, and no symptoms of withdrawal.  12/03/15: Pt reports no signs of withdrawal with high sitting BP. No CIWA score.   Attendees: Patient:   12/03/2015 8:29 AM   Family:   12/03/2015 8:29 AM   Physician:  Dr. Carlton Adam, MD 12/03/2015 8:29 AM   Nursing:   Charise Carwin RN 12/03/2015 8:29 AM   Clinical Social Worker: Maxie Better, LCSW 12/03/2015 8:29 AM   Clinical Social Worker: Erasmo Downer Drinkard LCSWA; Peri Maris LCSWA 12/03/2015 8:29 AM   Other:  Gerline Legacy Nurse Case Manager 12/03/2015 8:29 AM   Other:   12/03/2015 8:29 AM   Other:   12/03/2015 8:29 AM   Other:  12/03/2015 8:29 AM   Other:  12/03/2015 8:29 AM   Other:  12/03/2015 8:29 AM    12/03/2015 8:29 AM    12/03/2015 8:29 AM    12/03/2015 8:29 AM    12/03/2015 8:29 AM    Scribe for Treatment Team:   Maxie Better, LCSW 12/03/2015 8:29 AM

## 2015-12-03 NOTE — Progress Notes (Signed)
D-  Patient has been isolative to his room this shift.  Patient has been feeling sick with cold symptoms and a sore throat.  Patient was ordered a rapid strep exam and results were negative.  Patient has been resting in her room due to general malaise.  A-  Assess for safety, offer medications as prescribed, engage in 1:1 therapeutic talks,   R-  Patient denies SI, HI and AVH.  Patient able to contract for safety.

## 2015-12-03 NOTE — [facility] Group Notes (Signed)
[facility] LCSW Group Therapy  12/03/2015 1:40 PM  Type of Therapy:  Group Therapy  Participation Level:  Active  Participation Quality:  Attentive  Affect:  Appropriate  Cognitive:  Alert  Insight:  Improving  Engagement in Therapy:  Improving  Modes of Intervention:  Discussion, Education, Exploration, Problem-solving, Rapport Building, Socialization and Support  Summary of Progress/Problems: MHA Speaker came to talk about his personal journey with substance abuse and addiction. The pt processed ways by which to relate to the speaker. MHA speaker provided handouts and educational information pertaining to groups and services offered by the [facility].   Smart, Heather LCSW 12/03/2015, 1:40 PM

## 2015-12-03 NOTE — Progress Notes (Signed)
Recreation Therapy Notes  Animal-Assisted Activity (AAA) Program Checklist/Progress Notes Patient Eligibility Criteria Checklist & Daily Group note for Rec Tx Intervention  Date: 01.03.2017 Time: 2:45pm Location: 400 Hall Dayroom    AAA/T Program Assumption of Risk Form signed by Patient/ or Parent Legal Guardian yes  Patient is free of allergies or sever asthma yes  Patient reports no fear of animals yes  Patient reports no history of cruelty to animals yes  Patient understands his/her participation is voluntary yes  Behavioral Response: Did not attend.  Denise L Blanchfield, LRT/CTRS  Blanchfield, Denise L 12/03/2015 2:00 PM 

## 2015-12-04 MED ORDER — BOOST / RESOURCE BREEZE PO LIQD
1.0000 | Freq: Two times a day (BID) | ORAL | Status: DC
  Administered 2015-12-04 – 2015-12-10 (×9): 1 via ORAL
  Filled 2015-12-04 (×17): qty 1

## 2015-12-04 MED ORDER — LAMOTRIGINE 25 MG PO TABS
25.0000 mg | ORAL_TABLET | Freq: Every day | ORAL | Status: DC
  Filled 2015-12-04 (×2): qty 1

## 2015-12-04 MED ORDER — ENSURE ENLIVE PO LIQD
237.0000 mL | Freq: Every morning | ORAL | Status: DC
  Administered 2015-12-04 – 2015-12-07 (×3): 237 mL via ORAL

## 2015-12-04 MED ORDER — PAROXETINE HCL 10 MG PO TABS
10.0000 mg | ORAL_TABLET | Freq: Every day | ORAL | Status: DC
  Administered 2015-12-04 – 2015-12-06 (×3): 10 mg via ORAL
  Filled 2015-12-04 (×7): qty 1

## 2015-12-04 MED ORDER — ARIPIPRAZOLE 2 MG PO TABS
2.0000 mg | ORAL_TABLET | Freq: Every day | ORAL | Status: DC
  Administered 2015-12-05 – 2015-12-10 (×6): 2 mg via ORAL
  Filled 2015-12-04 (×8): qty 1

## 2015-12-04 NOTE — Progress Notes (Signed)
Recreation Therapy Notes  Date: 01.04.2017 Time: 9:30am Location: 300 [address] Group Room   Group Topic: Stress Management  Goal Area(s) Addresses:  Patient will actively participate in stress management techniques presented during session.   Behavioral Response: Appropriate   Intervention: Stress management techniques  Activity :  Deep Breathing and Guided Imagery. LRT provided instruction and demonstration on practice of Guided Imagery. Technique was coupled with deep breathing.   Education:  Stress Management, Discharge Planning.   Education Outcome: Acknowledges education  Clinical Observations/Feedback: Patient actively engaged in technique introduced, expressed no concerns and demonstrated ability to practice independently post d/c.   Denise L Blanchfield, LRT/CTRS        Blanchfield, Denise L 12/04/2015 12:54 PM 

## 2015-12-04 NOTE — Progress Notes (Signed)
NUTRITION ASSESSMENT  Pt identified as at risk on the Malnutrition Screen Tool  INTERVENTION: 1. Educated patient on the importance of nutrition and encouraged intake of food and beverages. 2. Discussed weight goals. 3. Supplements:  - Will order Boost Breeze BID, each supplement provides 250 kcal and 9 grams of protein - Will order Ensure Enlive once/day, this supplement provides 350 kcal and 20 grams of protein   NUTRITION DIAGNOSIS: Unintentional weight loss related to sub-optimal intake as evidenced by pt report.   Goal: Pt to meet >/= 90% of their estimated nutrition needs.  Monitor:  PO intake  Assessment:  Pt seen for MST. Pt admitted for severe depression with psychotic features, per notes. Also per notes, pt's father stated that pt had not eaten or gotten out of bed for several days PTA. Pt with SI. She drinks alcohol daily and intermittent polysubstance abuse including cocaine.  No recent weight hx for pt but she is currently underweight. Will order supplements as listed above and adjust as needed. Pt not meeting needs.  20 y.o. female  Height: Ht Readings from Last 1 Encounters:  12/02/15 5' (1.524 m)    Weight: Wt Readings from Last 1 Encounters:  12/02/15 94 lb (42.638 kg)    Weight Hx: Wt Readings from Last 10 Encounters:  12/02/15 94 lb (42.638 kg)  07/03/13 105 lb (47.628 kg) (10 %*, Z = -1.31)  11/03/12 109 lb (49.442 kg) (18 %*, Z = -0.91)  07/11/12 106 lb (48.081 kg) (14 %*, Z = -1.09)  07/24/11 108 lb (48.988 kg) (22 %*, Z = -0.78)  07/11/10 110 lb 1.6 oz (49.941 kg) (33 %*, Z = -0.43)  11/26/09 109 lb (49.442 kg) (37 %*, Z = -0.34)  11/07/09 107 lb (48.535 kg) (33 %*, Z = -0.44)  05/16/07 88 lb (39.917 kg) (29 %*, Z = -0.54)   * Growth percentiles are based on CDC 2-20 Years data.    BMI:  Body mass index is 18.36 kg/(m^2). Pt meets criteria for underweight based on current BMI.  Estimated Nutritional Needs: Kcal: 25-30 kcal/kg Protein: >  1 gram protein/kg Fluid: 1 ml/kcal  Diet Order: Diet regular Room service appropriate?: Yes; Fluid consistency:: Thin Pt is also offered choice of unit snacks mid-morning and mid-afternoon.  Pt is eating as desired.   Lab results and medications reviewed.      Trenton GammonJessica Ostheim, RD, LDN Inpatient Clinical Dietitian Pager # [phone] After hours/weekend pager # [phone]

## 2015-12-04 NOTE — Progress Notes (Signed)
[facility] referral sent per pt request.  Trula SladeHeather Smart, MSW, LCSW Clinical Social Worker 12/04/2015 3:50 PM

## 2015-12-04 NOTE — [facility] Group Notes (Signed)
[facility] LCSW Group Therapy  12/04/2015 3:47 PM  Type of Therapy:  Group Therapy  Participation Level:  Active  Participation Quality:  Attentive  Affect:  Depressed  Cognitive:  Alert and Oriented  Insight:  Improving  Engagement in Therapy:  Improving  Modes of Intervention:  Confrontation, Discussion, Education, Exploration, Problem-solving, Rapport Building, Socialization and Support  Summary of Progress/Problems: Emotion Regulation: This group focused on both positive and negative emotion identification and allowed group members to process ways to identify feelings, regulate negative emotions, and find healthy ways to manage internal/external emotions. Group members were asked to reflect on a time when their reaction to an emotion led to a negative outcome and explored how alternative responses using emotion regulation would have benefited them. Group members were also asked to discuss a time when emotion regulation was utilized when a negative emotion was experienced. Veronica Moore was attentive and engaged during today's processing group. She shared that she struggles with depression and coping with the stress of "living in an unstable environment where drugs are also available." "I think I need to go somewhere after here for more help." Veronica Moore continues to show progress in the group setting and demonstrates improving insight "I know I need more help. I'm not strong enough to make it out there without more coping skills."   Smart, Heather LCSW 12/04/2015, 3:47 PM

## 2015-12-04 NOTE — Progress Notes (Signed)
D-  Patient reports feeling anxious and depressed today.  Patient had an upsetting conversation with her significant other this shift and needed additional support and help practicing her coping skills after the conversation.  Patient was loud and tearful during the conversation but was able to calm down and was consoled by staff who offered support.    A-  Assess patient for safety, offer medications as prescribed, engage patient in 1:1 therapeutic talks.  R-  Patient denies SI, HI and AVH.  Patient able to contract for safety.
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
## 2015-12-04 NOTE — Progress Notes (Signed)
[facility] MD Progress Note  12/04/2015 6:32 PM Veronica Moore  MRN:  [id number] Subjective:  Veronica Moore continues to work on trying to get her life back together. States she has never been this committed to do well. She continues to detox from alcohol and benzos. States she is starting to feel clear headed but she does not know what she will want to do from here. She is committed to the relationship with her fiancee. They cant move together. She lives with her mother and the environment around her house is not good. She complains of her thoughts being scattered. She is also endorsing mood fluctuations that are independent of her substance use. Drastic mood swings that are triggered by little things. States both her mother and father have Bipolar Disorder and her father has done really well on Paxil an Abilify. She states that everyone can tell when he is not taking them Principal Problem: Severe recurrent major depression without psychotic features [facility]) Diagnosis:   Patient Active Problem List   Diagnosis Date Noted  . Benzodiazepine abuse [F13.10] 12/03/2015  . Alcohol abuse [F10.10] 12/03/2015  . Severe recurrent major depression without psychotic features (HCC) [F33.2] 12/03/2015  . PTSD (post-traumatic stress disorder) [F43.10] 12/03/2015  . 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 ADHD (attention deficit hyperactivity disorder)   . Allergy     Past Surgical History  Procedure Laterality Date  . No past surgeries     Family History: History reviewed. No pertinent family history. Family Psychiatric  History: see Admission H and P Social History:  History  Alcohol Use  . Yes    Comment: 6 pk daily     History  Drug Use  . Yes  . Special: Marijuana    Social History   Social History  . Marital Status:  Single    Spouse Name: N/A  . Number of Children: N/A  . Years of Education: N/A   Social History Main Topics  . Smoking status: Current Every Day Smoker -- 1.00 packs/day for 8 years    Types: Cigarettes  . Smokeless tobacco: None  . Alcohol Use: Yes     Comment: 6 pk daily  . Drug Use: Yes    Special: Marijuana  . Sexual Activity: Yes    Birth Control/ Protection: None   Other Topics Concern  . None   Social History Narrative   Pt does have a boyfriend not sexually active, has friends not much after school activities. Pt has a twin sister and a younger sister and brother.  Gets along well with family, thinking of staying in state for college and doing education for the developmentally challenge.    Additional Social History:    Pain Medications: Oxy Prescriptions: no prescription Over the Counter: See MAR History of alcohol / drug use?: Yes Longest period of sobriety (when/how long): sab Negative Consequences of Use: Personal relationships, Work / Programmer, multimedia Withdrawal Symptoms: Sweats Name of Substance 1: sab 1 - Age of First Use: 13 1 - Amount (size/oz): 6 pk daily 1 - Frequency: daily 1 - Duration: Last year 1 - Last Use / Amount: sab Name of Substance 2: sab 2 - Age of First Use: 18 2 - Amount (size/oz): 1mg  daily or more if patient can acquire them    2 - Frequency: 3 to  4 times a week   2 - Duration: last year 2 - Last Use / Amount: 11/30/15 patient reports using 1mg  Name of Substance 3: sab 3 - Age of First Use: 13 3 - Amount (size/oz): 60.00 daily 3 - Frequency: daily 3 - Duration: last two years 3 - Last Use / Amount: today 12/02/15 Name of Substance 4: oxy 4 - Age of First Use: 18 4 - Frequency: "every once and a while" 4 - Last Use / Amount: 5 months            Sleep: Fair  Appetite:  Fair  Current Medications: Current Facility-Administered Medications  Medication Dose Route Frequency Provider Last Rate Last Dose  . acetaminophen (TYLENOL)  tablet 650 mg  650 mg Oral Q6H PRN Leata Mouse, MD   650 mg at 12/03/15 1119  . alum & mag hydroxide-simeth (MAALOX/MYLANTA) 200-200-20 MG/5ML suspension 30 mL  30 mL Oral Q4H PRN Leata Mouse, MD      . amoxicillin-clavulanate (AUGMENTIN) 875-125 MG per tablet 1 tablet  1 tablet Oral Q12H Sanjuana Kava, NP   1 tablet at 12/04/15 0810  . feeding supplement (BOOST / RESOURCE BREEZE) liquid 1 Container  1 Container Oral BID BM Renie Ora, RD   1 Container at 12/04/15 1157  . feeding supplement (ENSURE ENLIVE) (ENSURE ENLIVE) liquid 237 mL  237 mL Oral q morning - 10a Anderson Malta Ostheim, RD   237 mL at 12/04/15 1158  . guaiFENesin (MUCINEX) 12 hr tablet 600 mg  600 mg Oral BID Sanjuana Kava, NP   600 mg at 12/04/15 1717  . hydrOXYzine (ATARAX/VISTARIL) tablet 25 mg  25 mg Oral Q6H PRN Rachael Fee, MD   25 mg at 12/04/15 1549  . Influenza vac split quadrivalent PF (FLUARIX) injection 0.5 mL  0.5 mL Intramuscular Tomorrow-1000 Rachael Fee, MD   0.5 mL at 12/03/15 1130  . loperamide (IMODIUM) capsule 2-4 mg  2-4 mg Oral PRN Rachael Fee, MD      . LORazepam (ATIVAN) tablet 1 mg  1 mg Oral Q6H PRN Rachael Fee, MD   1 mg at 12/03/15 2201  . [START ON 12/05/2015] LORazepam (ATIVAN) tablet 1 mg  1 mg Oral BID Rachael Fee, MD       Followed by  . [START ON 12/06/2015] LORazepam (ATIVAN) tablet 1 mg  1 mg Oral Daily Rachael Fee, MD      . magnesium hydroxide (MILK OF MAGNESIA) suspension 30 mL  30 mL Oral Daily PRN Leata Mouse, MD      . multivitamin with minerals tablet 1 tablet  1 tablet Oral Daily Rachael Fee, MD   1 tablet at 12/04/15 0810  . nicotine (NICODERM CQ - dosed in mg/24 hours) patch 21 mg  21 mg Transdermal Q0600 Leata Mouse, MD   21 mg at 12/04/15 0808  . ondansetron (ZOFRAN-ODT) disintegrating tablet 4 mg  4 mg Oral Q6H PRN Rachael Fee, MD      . PARoxetine (PAXIL) tablet 10 mg  10 mg Oral Daily Rachael Fee, MD   10 mg at  12/04/15 1202  . phenol (CHLORASEPTIC) mouth spray 1 spray  1 spray Mouth/Throat QID PRN Sanjuana Kava, NP      . pneumococcal 23 valent vaccine (PNU-IMMUNE) injection 0.5 mL  0.5 mL Intramuscular Tomorrow-1000 Rachael Fee, MD   0.5 mL at 12/03/15 1131  . thiamine (VITAMIN B-1) tablet 100 mg  100 mg Oral Daily Rachael FeeIrving A Lugo, MD   100 mg at 12/04/15 0810  . traZODone (DESYREL) tablet 50 mg  50 mg Oral QHS PRN,MR X 1 Leata MouseJanardhana Jonnalagadda, MD   50 mg at 12/03/15 2201    Lab Results:  Results for orders placed or performed during the hospital encounter of 12/02/15 (from the past 48 hour(s))  Rapid strep screen (not [facility])     Status: None   Collection Time: 12/03/15 10:10 AM  Result Value Ref Range   Streptococcus, Group A Screen (Direct) NEGATIVE NEGATIVE    Comment: (NOTE) A Rapid Antigen test may result negative if the antigen level in the sample is below the detection level of this test. The FDA has not cleared this test as a stand-alone test therefore the rapid antigen negative result has reflexed to a Group A Strep culture. [facility]     Physical Findings: AIMS: Facial and Oral Movements Muscles of Facial Expression: None, normal Lips and Perioral Area: None, normal Jaw: None, normal Tongue: None, normal,Extremity Movements Upper (arms, wrists, hands, fingers): None, normal Lower (legs, knees, ankles, toes): None, normal, Trunk Movements Neck, shoulders, hips: None, normal, Overall Severity Severity of abnormal movements (highest score from questions above): None, normal Incapacitation due to abnormal movements: None, normal Patient's awareness of abnormal movements (rate only patient's report): No Awareness, Dental Status Current problems with teeth and/or dentures?: No Does patient usually wear dentures?: No  CIWA:  CIWA-Ar Total: 3 COWS:     Musculoskeletal: Strength & Muscle Tone: within normal limits Gait & Station:  normal Patient leans: normal  Psychiatric Specialty Exam: Review of Systems  Constitutional: Negative.   HENT: Negative.   Eyes: Negative.   Respiratory: Negative.   Cardiovascular: Negative.   Gastrointestinal: Negative.   Genitourinary: Negative.   Musculoskeletal: Negative.   Skin: Negative.   Neurological: Negative.   Endo/Heme/Allergies: Negative.   Psychiatric/Behavioral: Positive for substance abuse. The patient is nervous/anxious.     Blood pressure 107/70, pulse 87, temperature 99.1 F (37.3 C), temperature source Oral, resp. rate 16, height 5' (1.524 m), weight 42.638 kg (94 lb), SpO2 99 %.Body mass index is 18.36 kg/(m^2).  General Appearance: Fairly Groomed  Patent attorneyye Contact::  Fair  Speech:  Clear and Coherent  Volume:  Normal  Mood:  Anxious  Affect:  Labile and Tearful  Thought Process:  Coherent and Goal Directed  Orientation:  Full (Time, Place, and Person)  Thought Content:  symptoms events worries concerns  Suicidal Thoughts:  No  Homicidal Thoughts:  No  Memory:  Immediate;   Fair Recent;   Fair Remote;   Fair  Judgement:  Fair  Insight:  Present  Psychomotor Activity:  Restlessness  Concentration:  Fair  Recall:  FiservFair  Fund of Knowledge:Fair  Language: Fair  Akathisia:  No  Handed:  Right  AIMS (if indicated):     Assets:  Desire for Improvement Housing Social Support  ADL's:  Intact  Cognition: WNL  Sleep:  Number of Hours: 7   Treatment Plan Summary: Daily contact with patient to assess and evaluate symptoms and progress in treatment and Medication management Supportive approach/coping skills Alcohol benzodiazepine abuse; Ativan detox protocol/work a relapse prevention plan Mood instability; given that her father has responded so well to the Paxil/Abilify combination will go ahead and give it a try. Will start a trial with  Paxil 10 mg daily will reassess for an increase to 20 mg.  Will start Abilify  2 mg in AM . Will also consider a mood  stabilizer like Lamictal starting at 25 mg Will work with CBT/mindfulness  LUGO,IRVING A 12/04/2015, 6:32 PM

## 2015-12-04 NOTE — [facility] Group Notes (Signed)
[facility] Note   12/04/2015 11:08 AM  Participation Quality:  Appropriate   Mood/Affect:  Appropriate  Depression Rating:  8  Anxiety Rating:  8  Thoughts of Suicide:  No Will you contract for safety?   NA  Current AVH:  No  Plan for Discharge/Comments:  Pt reports that she lives with her parents and plans to return home unless she decides to seek inpatient treatment. Pt ambivalent at this point regarding aftercare and reports no current providers. CSW assessing. Pt reports minimal withdrawals, poor sleep and "I have a cold."   Transportation Means: parent   Supports: parents  Smart, Conservation officer, natureHeather LCSW

## 2015-12-05 LAB — CULTURE, GROUP A STREP

## 2015-12-05 NOTE — Progress Notes (Signed)
[facility] MD Progress Note  12/05/2015 5:28 PM Veronica Moore  MRN:  [id number] Subjective:  Veronica Moore states that she is still trying hard to get her life together. She had a hard time yesterday and experienced a "bad panic attack" when her father called her and went trough her phone and started questioning her and accusing her of different things. She got upset and required extra support from staff to calm down. States she worked with a Clinical biochemist using deep breathing. She states she really needs more help past these few days. She is very afraid, concerned about going back home the way she is feeling and getting back to do the same situation around the same people and relapsing Principal Problem: Severe recurrent major depression without psychotic features (HCC) Diagnosis:   Patient Active Problem List   Diagnosis Date Noted  . Benzodiazepine abuse [F13.10] 12/03/2015  . Alcohol abuse [F10.10] 12/03/2015  . Severe recurrent major depression without psychotic features (HCC) [F33.2] 12/03/2015  . PTSD (post-traumatic stress disorder) [F43.10] 12/03/2015  . 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 ADHD (attention deficit hyperactivity disorder)   . Allergy     Past Surgical History  Procedure Laterality Date  . No past surgeries     Family History: History reviewed. No pertinent family history. Family Psychiatric  History: see admission H and P Social History:  History  Alcohol Use  . Yes    Comment: 6 pk daily     History  Drug Use  . Yes  . Special: Marijuana    Social History   Social History  . Marital Status: Single    Spouse Name: N/A  . Number of Children: N/A  . Years of Education: N/A   Social History Main Topics  . Smoking status: Current Every Day Smoker -- 1.00 packs/day for 8 years     Types: Cigarettes  . Smokeless tobacco: None  . Alcohol Use: Yes     Comment: 6 pk daily  . Drug Use: Yes    Special: Marijuana  . Sexual Activity: Yes    Birth Control/ Protection: None   Other Topics Concern  . None   Social History Narrative   Pt does have a boyfriend not sexually active, has friends not much after school activities. Pt has a twin sister and a younger sister and brother.  Gets along well with family, thinking of staying in [place] for college and doing education for the developmentally challenge.    Additional Social History:    Pain Medications: Oxy Prescriptions: no prescription Over the Counter: See MAR History of alcohol / drug use?: Yes Longest period of sobriety (when/how long): sab Negative Consequences of Use: Personal relationships, Work / Programmer, multimedia Withdrawal Symptoms: Sweats Name of Substance 1: sab 1 - Age of First Use: 13 1 - Amount (size/oz): 6 pk daily 1 - Frequency: daily 1 - Duration: Last year 1 - Last Use / Amount: sab Name of Substance 2: sab 2 - Age of First Use: 18 2 - Amount (size/oz): 1mg  daily or more if patient can acquire them    2 - Frequency: 3 to 4 times a week   2 - Duration: last year 2 - Last Use / Amount: 11/30/15 patient reports using 1mg  Name of Substance 3: sab 3 - Age of First Use:  13 3 - Amount (size/oz): 60.00 daily 3 - Frequency: daily 3 - Duration: last two years 3 - Last Use / Amount: today 12/02/15 Name of Substance 4: oxy 4 - Age of First Use: 18 4 - Frequency: "every once and a while" 4 - Last Use / Amount: 5 months            Sleep: Fair  Appetite:  Fair  Current Medications: Current Facility-Administered Medications  Medication Dose Route Frequency Provider Last Rate Last Dose  . acetaminophen (TYLENOL) tablet 650 mg  650 mg Oral Q6H PRN Leata MouseJanardhana Jonnalagadda, MD   650 mg at 12/03/15 1119  . alum & mag hydroxide-simeth (MAALOX/MYLANTA) 200-200-20 MG/5ML suspension 30 mL  30 mL Oral Q4H PRN  Leata MouseJanardhana Jonnalagadda, MD      . amoxicillin-clavulanate (AUGMENTIN) 875-125 MG per tablet 1 tablet  1 tablet Oral Q12H Sanjuana KavaAgnes I Nwoko, NP   1 tablet at 12/05/15 [phone]  . ARIPiprazole (ABILIFY) tablet 2 mg  2 mg Oral Daily Rachael FeeIrving A Lugo, MD   2 mg at 12/05/15 [id number]  . feeding supplement (BOOST / RESOURCE BREEZE) liquid 1 Container  1 Container Oral BID BM Renie OraJessica M Ostheim, RD   1 Container at 12/04/15 1157  . feeding supplement (ENSURE ENLIVE) (ENSURE ENLIVE) liquid 237 mL  237 mL Oral q morning - 10a Anderson MaltaJessica M Ostheim, RD   237 mL at 12/04/15 1158  . guaiFENesin (MUCINEX) 12 hr tablet 600 mg  600 mg Oral BID Sanjuana KavaAgnes I Nwoko, NP   600 mg at 12/05/15 1727  . hydrOXYzine (ATARAX/VISTARIL) tablet 25 mg  25 mg Oral Q6H PRN Rachael FeeIrving A Lugo, MD   25 mg at 12/04/15 1549  . Influenza vac split quadrivalent PF (FLUARIX) injection 0.5 mL  0.5 mL Intramuscular Tomorrow-1000 Rachael FeeIrving A Lugo, MD   0.5 mL at 12/03/15 1130  . loperamide (IMODIUM) capsule 2-4 mg  2-4 mg Oral PRN Rachael FeeIrving A Lugo, MD      . LORazepam (ATIVAN) tablet 1 mg  1 mg Oral Q6H PRN Rachael FeeIrving A Lugo, MD   1 mg at 12/04/15 2307  . [START ON 12/06/2015] LORazepam (ATIVAN) tablet 1 mg  1 mg Oral Daily Rachael FeeIrving A Lugo, MD      . magnesium hydroxide (MILK OF MAGNESIA) suspension 30 mL  30 mL Oral Daily PRN Leata MouseJanardhana Jonnalagadda, MD      . multivitamin with minerals tablet 1 tablet  1 tablet Oral Daily Rachael FeeIrving A Lugo, MD   1 tablet at 12/05/15 [id number]  . nicotine (NICODERM CQ - dosed in mg/24 hours) patch 21 mg  21 mg Transdermal Q0600 Leata MouseJanardhana Jonnalagadda, MD   21 mg at 12/05/15 0831  . ondansetron (ZOFRAN-ODT) disintegrating tablet 4 mg  4 mg Oral Q6H PRN Rachael FeeIrving A Lugo, MD      . PARoxetine (PAXIL) tablet 10 mg  10 mg Oral Daily Rachael FeeIrving A Lugo, MD   10 mg at 12/05/15 [id number]  . phenol (CHLORASEPTIC) mouth spray 1 spray  1 spray Mouth/Throat QID PRN Sanjuana KavaAgnes I Nwoko, NP      . pneumococcal 23 valent vaccine (PNU-IMMUNE) injection 0.5 mL  0.5 mL Intramuscular  Tomorrow-1000 Rachael FeeIrving A Lugo, MD   0.5 mL at 12/03/15 1131  . thiamine (VITAMIN B-1) tablet 100 mg  100 mg Oral Daily Rachael FeeIrving A Lugo, MD   100 mg at 12/05/15 [id number]  . traZODone (DESYREL) tablet 50 mg  50 mg Oral QHS PRN,MR X 1 Leata MouseJanardhana Jonnalagadda, MD   50 mg  at 12/04/15 2307    Lab Results: No results found for this or any previous visit (from the past 48 hour(s)).  Physical Findings: AIMS: Facial and Oral Movements Muscles of Facial Expression: None, normal Lips and Perioral Area: None, normal Jaw: None, normal Tongue: None, normal,Extremity Movements Upper (arms, wrists, hands, fingers): None, normal Lower (legs, knees, ankles, toes): None, normal, Trunk Movements Neck, shoulders, hips: None, normal, Overall Severity Severity of abnormal movements (highest score from questions above): None, normal Incapacitation due to abnormal movements: None, normal Patient's awareness of abnormal movements (rate only patient's report): No Awareness, Dental Status Current problems with teeth and/or dentures?: No Does patient usually wear dentures?: No  CIWA:  CIWA-Ar Total: 6 COWS:     Musculoskeletal: Strength & Muscle Tone: within normal limits Gait & Station: normal Patient leans: normal  Psychiatric Specialty Exam: Review of Systems  Constitutional: Negative.   HENT: Negative.   Eyes: Negative.   Respiratory: Negative.   Cardiovascular: Negative.   Gastrointestinal: Negative.   Genitourinary: Negative.   Musculoskeletal: Negative.   Skin: Negative.   Neurological: Negative.   Endo/Heme/Allergies: Negative.   Psychiatric/Behavioral: Positive for substance abuse. The patient is nervous/anxious.     Blood pressure 117/78, pulse 89, temperature 98.8 F (37.1 C), temperature source Oral, resp. rate 20, height 5' (1.524 m), weight 42.638 kg (94 lb), SpO2 99 %.Body mass index is 18.36 kg/(m^2).  General Appearance: Fairly Groomed  Patent attorney::  Fair  Speech:  Clear and Coherent   Volume:  Normal  Mood:  Anxious and sad worried  Affect:  anxious worried concerned  Thought Process:  Coherent and Goal Directed  Orientation:  Full (Time, Place, and Person)  Thought Content:  symptms events worries concerns  Suicidal Thoughts:  No  Homicidal Thoughts:  No  Memory:  Immediate;   Fair Recent;   Fair Remote;   Fair  Judgement:  Fair  Insight:  Present and Shallow  Psychomotor Activity:  Restlessness  Concentration:  Fair  Recall:  [employer] of Knowledge:Fair  Language: Fair  Akathisia:  No  Handed:  Right  AIMS (if indicated):     Assets:  Desire for Improvement  ADL's:  Intact  Cognition: WNL  Sleep:  Number of Hours: 5.25   Treatment Plan Summary: Daily contact with patient to assess and evaluate symptoms and progress in treatment and Medication management Supportive approach/coping skills Alcohol, benzodiazepine abuse-dependence; continue the Ativan detox protocol/work a relapse prevention plan Mood instability; continue the Abilify ( she was given the first dose this AM and she tolerated it well)  Depression/Anxiety/PTSD; continue to work with the Paxil 10 mg and increased it to 20 mg as tolerated Use CBT/mindfulness Explore residential treatment options LUGO,IRVING A 12/05/2015, 5:28 PM

## 2015-12-05 NOTE — [facility] Group Notes (Signed)
[facility] LCSW Group Therapy  12/05/2015 1:06 PM  Type of Therapy:  Group Therapy  Participation Level:  Did Not Attend-pt had phone interview with [facility] during group-excused.   Modes of Intervention:  Confrontation, Discussion, Education, Exploration, Problem-solving, Rapport Building, Socialization and Support  Summary of Progress/Problems:  Finding Balance in Life. Today's group focused on defining balance in one's own words, identifying things that can knock one off balance, and exploring healthy ways to maintain balance in life. Group members were asked to provide an example of a time when they felt off balance, describe how they handled that situation,and process healthier ways to regain balance in the future. Group members were asked to share the most important tool for maintaining balance [facility] and how they plan to apply this method after discharge.   Smart, Heather LCSW 12/05/2015, 1:06 PM

## 2015-12-05 NOTE — Progress Notes (Signed)
Patient ID: Veronica RasSydney Moore, female   DOB: 10/12/1996, 20 y.o.   MRN: [id number]  Pt currently presents with a flat affect and anxious behavior. Per self inventory, pt rates depression at a 5, hopelessness 5 and anxiety 7. Pt's daily goal is to "staying positive and working on my plan for recovery" and they intend to do so by [facility], keep my mind clear of worry." Pt reports good sleep, a poor appetite, low energy and good concentration.   Pt provided with medications per providers orders. Pt's labs and vitals were monitored throughout the day. Pt supported emotionally and encouraged to express concerns and questions. Pt educated on medications.  Pt's safety ensured with 15 minute and environmental checks. Pt currently denies SI/HI and A/V hallucinations. Pt verbally agrees to seek staff if SI/HI or A/VH occurs and to consult with staff before acting on these thoughts. Will continue POC.

## 2015-12-05 NOTE — Progress Notes (Signed)
[facility] Group Notes:  (Nursing/MHT/Case Management/Adjunct)  Date:  12/05/2015  Time:  2030 Type of Therapy:  wrap up group  Participation Level:  Active  Participation Quality:  Appropriate, Attentive, Sharing and Supportive  Affect:  Labile  Cognitive:  Appropriate  Insight:  Limited  Engagement in Group:  Engaged  Modes of Intervention:  Clarification, Education and Support  Summary of Progress/Problems: Pt reported being happy with her medication being started on Paxil with Abilify. Pt feels like she has the right combination now. Pt reports her family being dysfunctional, especially with communications within the family. Pt plans to return to her fathers house, find a new job, find support in her fiance, and soon get her own place.   Shelah LewandowskySquires, Lindsay Carol 12/05/2015, 10:10 PM

## 2015-12-05 NOTE — [facility] Group Notes (Signed)
[facility] Group Notes:  (Nursing/MHT/Case Management/Adjunct)  Date:  12/05/2015  Time:  10:10 AM  Type of Therapy:  Nurse Education  Participation Level:  Did Not Attend  Participation Quality:    Affect:    Cognitive:    Insight:    Engagement in Group:    Modes of Intervention:    Summary of Progress/Problems:  Andres Egeritchett, Jennifer Hundley 12/05/2015, 10:10 AM

## 2015-12-05 NOTE — Progress Notes (Signed)
[facility] due to recent suicide attempt. Pt interested in [facility]. CSW called [facility] and verified that they accept [employer]. Referral faxed. CSW also left message for Charmian MuffAnn Evans requesting that she meet with pt to discuss CDIOP.  Trula SladeHeather Smart, MSW, LCSW Clinical Social Worker 12/05/2015 3:37 PM

## 2015-12-05 NOTE — Progress Notes (Signed)
D   Pt was tearful at the beginning of the shift and requested to sign a 72 hour request for discharge   She then about 15 min later rescinded it because she said someone told her that we could keep her for 21 days and she doesn't want to be here that long   Pt demonstrates limited insight and does not take responsibility for her decisions   It was suggested she go to AA or NA meetings but pt said she doesn't like those meetings A   Verbal support given   Medications administered and effectiveness monitored    Q 15 min checks R   Pt safe at present

## 2015-12-06 MED ORDER — PAROXETINE HCL 20 MG PO TABS
20.0000 mg | ORAL_TABLET | Freq: Every day | ORAL | Status: DC
Start: 2015-12-07 — End: 2015-12-10
  Administered 2015-12-07 – 2015-12-10 (×4): 20 mg via ORAL
  Filled 2015-12-06 (×7): qty 1

## 2015-12-06 MED ORDER — MEGESTROL ACETATE 40 MG PO TABS: 40.0000 mg | ORAL_TABLET | Freq: Every day | ORAL | Status: DC

## 2015-12-06 MED ORDER — MEGESTROL ACETATE 40 MG/ML PO SUSP
400.0000 mg | Freq: Every day | ORAL | Status: DC
  Administered 2015-12-06 – 2015-12-10 (×5): 400 mg via ORAL
  Filled 2015-12-06 (×7): qty 10

## 2015-12-06 MED ORDER — MIRTAZAPINE 15 MG PO TABS
15.0000 mg | ORAL_TABLET | Freq: Every day | ORAL | Status: DC
  Administered 2015-12-06 – 2015-12-08 (×3): 15 mg via ORAL
  Filled 2015-12-06 (×6): qty 1

## 2015-12-06 NOTE — Progress Notes (Signed)
Adult Psychoeducational Group Note  Date:  12/06/2015 Time:  10:10 PM  Group Topic/Focus:  Wrap-Up Group:   The focus of this group is to help patients review their daily goal of treatment and discuss progress on daily workbooks.  Participation Level:  Did Not Attend  Owen, Dana C 12/06/2015, 10:10 PM 

## 2015-12-06 NOTE — [facility] Group Notes (Signed)
[facility]   12/06/2015 1:20 PM  Participation Quality:  Appropriate   Mood/Affect:  Depressed and Flat  Depression Rating:  7  Anxiety Rating:  7  Thoughts of Suicide:  No Will you contract for safety?   NA  Current AVH:  No  Plan for Discharge/Comments:  Pt reports that [facility] and is interested in CDIOP with Charm[facility] MuffAnn Evans. CSW assessing.   Transportation Means: parent/father  Supports: Water quality scientistfather/stepmother  Smart, Conservation officer, natureHeather LCSW

## 2015-12-06 NOTE — Progress Notes (Signed)
D: Athelene rates today as "fair" and "better than yesterday". Her goal today was to keep her thoughts positive. She states she had a conversation with her father today over the phone that was not positive. She does not feel his is very supportive or sympathetic to what she is going through. He did call back later after the phone call to apologize and she was pleased with how this conversation ended. She rates Anxiety 4/10  Depression 5/10 and Hopelessness 4/10. She was also happy that she was able to speak with her fiance today as well. She discussed with me that she has anorexia nervosa and has not been treated for it. She states her parents are aware but do not want to go to family therapy and she does not want to go for treatment because they will "force me to eat". I encouraged her to speak with the provider or social worker regarding treatment options and facilities. She denies SI/HI/AVH at this time.  A: Encouragement and support given. Q 15 minute checks for patient safety. Medications administered as prescribed.  R: Continue to monitor for patient safety and medication effectiveness.

## 2015-12-06 NOTE — Progress Notes (Signed)
D-  Patient reports having severe issues with eating.  Patient reports being anorexic and having severe anxiety around meals.  Patient worked on eating meals today and supplements and set a goal of eating at least half of all of her meals today.  Patient states that she is young and she wants to get her life in order.  Patient acknowledges that she has a drug addiction problem and she desires sobriety. Patient reported having an increased mood and denies SI, HI, and AVH.    A- assess patient for safety, offer medications as prescribed, engage patient in 1:1 talks,   R-  Patient able to contract for safety.

## 2015-12-06 NOTE — Progress Notes (Signed)
[facility] MD Progress Note  12/06/2015 7:28 PM Veronica Moore  MRN:  [id number] Subjective:  Shlonda continues to have a had time. She is having episodes of increased anxiety (panic feeling) she is not eating well. She does not want to eat the regular food and she does not like the Booze nor the Ensure. States she does not have an appetite. She depended on smoking pot. She was able to contact someone her mother knows in [place] and he is Catering manager of a residential treatment program there. He was supportive of her and she could be going to program what is encouraging.  Principal Problem: Severe recurrent major depression without psychotic features (HCC) Diagnosis:   Patient Active Problem List   Diagnosis Date Noted  . Benzodiazepine abuse [F13.10] 12/03/2015  . Alcohol abuse [F10.10] 12/03/2015  . Severe recurrent major depression without psychotic features (HCC) [F33.2] 12/03/2015  . PTSD (post-traumatic stress disorder) [F43.10] 12/03/2015  . 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 ADHD (attention deficit hyperactivity disorder)   . Allergy     Past Surgical History  Procedure Laterality Date  . No past surgeries     Family History: History reviewed. No pertinent family history. Family Psychiatric  History: see admission H and P Social History:  History  Alcohol Use  . Yes    Comment: 6 pk daily     History  Drug Use  . Yes  . Special: Marijuana    Social History   Social History  . Marital Status: Single    Spouse Name: N/A  . Number of Children: N/A  . Years of Education: N/A   Social History Main Topics  . Smoking status: Current Every Day Smoker -- 1.00 packs/day for 8 years    Types: Cigarettes  . Smokeless tobacco: None  . Alcohol Use: Yes     Comment: 6 pk daily  . Drug Use: Yes    Special: Marijuana  . Sexual Activity: Yes    Birth Control/ Protection: None   Other Topics Concern  . None   Social History Narrative   Pt does have a boyfriend not sexually active, has friends not much after school activities. Pt has a twin sister and a younger sister and brother.  Gets along well with family, thinking of staying in state for college and doing education for the developmentally challenge.    Additional Social History:    Pain Medications: Oxy Prescriptions: no prescription Over the Counter: See MAR History of alcohol / drug use?: Yes Longest period of sobriety (when/how long): sab Negative Consequences of Use: Personal relationships, Work / Programmer, multimedia Withdrawal Symptoms: Sweats Name of Substance 1: sab 1 - Age of First Use: 13 1 - Amount (size/oz): 6 pk daily 1 - Frequency: daily 1 - Duration: Last year 1 - Last Use / Amount: sab Name of Substance 2: sab 2 - Age of First Use: 18 2 - Amount (size/oz): 1mg  daily or more if patient can acquire them    2 - Frequency: 3 to 4 times a week   2 - Duration: last year 2 - Last Use / Amount: 11/30/15 patient reports using 1mg  Name of Substance 3: sab 3 - Age of First Use: 13 3 - Amount (size/oz): 60.00 daily 3 - Frequency: daily 3 - Duration: last two years  3 - Last Use / Amount: today 12/02/15 Name of Substance 4: oxy 4 - Age of First Use: 18 4 - Frequency: "every once and a while" 4 - Last Use / Amount: 5 months            Sleep: Fair  Appetite:  Poor  Current Medications: Current Facility-Administered Medications  Medication Dose Route Frequency Provider Last Rate Last Dose  . acetaminophen (TYLENOL) tablet 650 mg  650 mg Oral Q6H PRN Leata MouseJanardhana Jonnalagadda, MD   650 mg at 12/03/15 1119  . alum & mag hydroxide-simeth (MAALOX/MYLANTA) 200-200-20 MG/5ML suspension 30 mL  30 mL Oral Q4H PRN Leata MouseJanardhana Jonnalagadda, MD      . amoxicillin-clavulanate (AUGMENTIN) 875-125 MG per tablet 1 tablet  1 tablet  Oral Q12H Sanjuana KavaAgnes I Nwoko, NP   1 tablet at 12/06/15 [phone]  . ARIPiprazole (ABILIFY) tablet 2 mg  2 mg Oral Daily Rachael FeeIrving A Lugo, MD   2 mg at 12/06/15 0840  . feeding supplement (BOOST / RESOURCE BREEZE) liquid 1 Container  1 Container Oral BID BM Renie OraJessica M Ostheim, RD   1 Container at 12/06/15 1218  . feeding supplement (ENSURE ENLIVE) (ENSURE ENLIVE) liquid 237 mL  237 mL Oral q morning - 10a Anderson MaltaJessica M Ostheim, RD   237 mL at 12/06/15 1220  . guaiFENesin (MUCINEX) 12 hr tablet 600 mg  600 mg Oral BID Sanjuana KavaAgnes I Nwoko, NP   600 mg at 12/06/15 1619  . Influenza vac split quadrivalent PF (FLUARIX) injection 0.5 mL  0.5 mL Intramuscular Tomorrow-1000 Rachael FeeIrving A Lugo, MD   0.5 mL at 12/03/15 1130  . magnesium hydroxide (MILK OF MAGNESIA) suspension 30 mL  30 mL Oral Daily PRN Leata MouseJanardhana Jonnalagadda, MD      . megestrol (MEGACE) 40 MG/ML suspension 400 mg  400 mg Oral Daily Rachael FeeIrving A Lugo, MD   400 mg at 12/06/15 1618  . mirtazapine (REMERON) tablet 15 mg  15 mg Oral QHS Rachael FeeIrving A Lugo, MD      . multivitamin with minerals tablet 1 tablet  1 tablet Oral Daily Rachael FeeIrving A Lugo, MD   1 tablet at 12/06/15 [phone]  . nicotine (NICODERM CQ - dosed in mg/24 hours) patch 21 mg  21 mg Transdermal Q0600 Leata MouseJanardhana Jonnalagadda, MD   21 mg at 12/06/15 0600  . PARoxetine (PAXIL) tablet 10 mg  10 mg Oral Daily Rachael FeeIrving A Lugo, MD   10 mg at 12/06/15 0840  . phenol (CHLORASEPTIC) mouth spray 1 spray  1 spray Mouth/Throat QID PRN Sanjuana KavaAgnes I Nwoko, NP      . pneumococcal 23 valent vaccine (PNU-IMMUNE) injection 0.5 mL  0.5 mL Intramuscular Tomorrow-1000 Rachael FeeIrving A Lugo, MD   0.5 mL at 12/03/15 1131  . thiamine (VITAMIN B-1) tablet 100 mg  100 mg Oral Daily Rachael FeeIrving A Lugo, MD   100 mg at 12/06/15 [id number]    Lab Results: No results found for this or any previous visit (from the past 48 hour(s)).  Physical Findings: AIMS: Facial and Oral Movements Muscles of Facial Expression: None, normal Lips and Perioral Area: None, normal Jaw: None,  normal Tongue: None, normal,Extremity Movements Upper (arms, wrists, hands, fingers): None, normal Lower (legs, knees, ankles, toes): None, normal, Trunk Movements Neck, shoulders, hips: None, normal, Overall Severity Severity of abnormal movements (highest score from questions above): None, normal Incapacitation due to abnormal movements: None, normal Patient's awareness of abnormal movements (rate only patient's report): No Awareness, Dental Status Current problems with teeth and/or dentures?: No  Does patient usually wear dentures?: No  CIWA:  CIWA-Ar Total: 0 COWS:     Musculoskeletal: Strength & Muscle Tone: within normal limits Gait & Station: normal Patient leans: normal  Psychiatric Specialty Exam: Review of Systems  Constitutional: Positive for weight loss.  HENT: Negative.   Eyes: Negative.   Respiratory: Negative.   Cardiovascular: Negative.   Gastrointestinal: Negative.   Genitourinary: Negative.   Musculoskeletal: Negative.   Skin: Negative.   Neurological: Negative.   Endo/Heme/Allergies: Negative.   Psychiatric/Behavioral: Positive for depression and substance abuse. The patient is nervous/anxious.     Blood pressure 120/80, pulse 102, temperature 98.2 F (36.8 C), temperature source Oral, resp. rate 16, height 5' (1.524 m), weight 42.638 kg (94 lb), SpO2 99 %.Body mass index is 18.36 kg/(m^2).  General Appearance: Fairly Groomed  Patent attorney::  Fair  Speech:  Clear and Coherent  Volume:  Decreased  Mood:  Anxious and Depressed  Affect:  anxious depressed labile  Thought Process:  Coherent and Goal Directed  Orientation:  Full (Time, Place, and Person)  Thought Content:  symptoms events worries concerns  Suicidal Thoughts:  No  Homicidal Thoughts:  No  Memory:  Immediate;   Fair Recent;   Fair Remote;   Fair  Judgement:  Fair  Insight:  Present and Shallow  Psychomotor Activity:  Restlessness  Concentration:  Fair  Recall:  [employer] of  Knowledge:Fair  Language: Fair  Akathisia:  No  Handed:  Right  AIMS (if indicated):     Assets:  Desire for Improvement  ADL's:  Intact  Cognition: WNL  Sleep:  Number of Hours: 6.25   Treatment Plan Summary: Daily contact with patient to assess and evaluate symptoms and progress in treatment and Medication management Supportive approach/coping skills Alcohol benzodiazepine abuse; continue to work a relapse prevention plan Depression/anxiety; continue the Paxil increase to 20 mg daily Continue the Abilify at 2 mg consider increasing to 5  Insomnia; will change the Trazodone to Remeron what could help with the decreased appetite as well as the anxiety and sleep Lack of appetite; will try Megace  Will explore the residential treatment program she contacted and facilitate admission if appropriate  LUGO,IRVING A 12/06/2015, 7:28 PM

## 2015-12-06 NOTE — Tx Team (Signed)
Interdisciplinary Treatment Plan Update (Adult)  Date:  12/06/2015  Time Reviewed:  1:21 PM   Progress in Treatment: Attending groups: Yes Participating in groups:  Yes Taking medication as prescribed:  Yes. Tolerating medication:  Yes. Family/Significant othe contact made:  SPE required for this pt.  Patient understands diagnosis:  Yes. and As evidenced by:  seeking treatment for anxiety/depression, medication stabilization, ETOH/THC abuse. Discussing patient identified problems/goals with staff:  Yes. Medical problems stabilized or resolved:  Yes. Denies suicidal/homicidal ideation: Yes. Issues/concerns per patient self-inventory:  Other:    Discharge Plan or Barriers:  Pt declined at [facility] [place]. Wants to return home and follow-up o/p. Pt interested in [place]. CSW assessing.   Reason for Continuation of Hospitalization: Anxiety Depression Medication stabilization Withdrawal symptoms  Comments:  Veronica Moore is an 20 y.o. female presenting this date with her father who brought her to [facility] due to patient having SI with a plan to harm herself. Collateral information from father states patient has not been out of her bed in two days and has not eaten. Patient was found this morning by her father in her room stating she had thoughts of self harm and was going to overdose on drugs. Patient states she has been depressed for the last month and rated her depression at a 10 at the time of this assessment. Patient reports ongoing anxiety rating her symptoms at a 8 at the time of assessment. Patient reports ongoing SA issues and daily use of THC where patient reports that she is using up to 3 grams of Cannabis daily and also using alcohol (3or 4 8 oz. Beers) three to four times a week with last use being 11/30/15 where she reported consuming 3 8 oz. beers. Patient also has a history of IV cocaine use reporting last use 3 months ago, but denies use since then. Patient reports a history of  depression and SA abuse in her family with patient stating she attended outpatient treatment with her mother in 2014 where she attended groups for three weeks. Patient denies any MH diagnosis but did state she felt she has ADHD and has taken medication in the past. Patient denies taking any current medication/s but is open to medication interventions to assist with depression/anxiety. Patient has had one noted suicide attempt in 2015 where she admits to cutting her right inner thigh while taking a shower bur her partner at that time stopped her. 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   Additional Comments:  Patient and CSW reviewed pt's identified goals and treatment plan. Patient verbalized understanding and agreed to treatment plan. CSW reviewed [facility] "Discharge Process and Patient Involvement" Form. Pt verbalized understanding of information provided and signed form.    Review of initial/current patient goals per problem list:  1. Goal(s): Patient will participate in aftercare plan  Met: No.   Target date: at discharge  As evidenced by: Patient will participate within aftercare plan AEB aftercare provider and housing plan at discharge being identified.  12/03/15: CSW assessing for appropriate referrals.   1/6: Pt wants to return home and follow-up outpatient; possibly [facility]. CSW assessing.   2. Goal (s): Patient will exhibit decreased depressive symptoms and suicidal ideations.  Met: No.    Target date: at discharge  As evidenced by: Patient  will utilize self rating of depression at 3 or below and demonstrate decreased signs of depression or be deemed stable for discharge by MD.  12/02/14: Pt rates depression as high. Denies SI/HI/AVH today.   1/6: PT rates depression as 7/10 and  presents with depressed mood/flat affect.   3. Goal(s): Patient will demonstrate decreased signs and symptoms of anxiety.  Met:No.   Target date: at discharge  As evidenced by: Patient will utilize self rating of anxiety at 3 or below and demonstrated decreased signs of anxiety, or be deemed stable for discharge by MD  12/03/15: Pt rates anxiety as high today.   12/06/15: Pt rates anxiety as 7/10.   4. Goal(s): Patient will demonstrate decreased signs of withdrawal due to substance abuse  Met: Goal progressing.   Target date:at discharge   As evidenced by: Patient will produce a CIWA/COWS score of 0, have stable vitals signs, and no symptoms of withdrawal.  12/03/15: Pt reports no signs of withdrawal with high sitting BP. No CIWA score.   1/6: Pt reports mild withdrawal symptoms with CIWA of 4 and high BP.   Attendees: Patient:   12/06/2015 1:21 PM   Family:   12/06/2015 1:21 PM   Physician:  Dr. Carlton Adam, MD 12/06/2015 1:21 PM   Nursing:   Roanna Banning RN  12/06/2015 1:21 PM   Clinical Social Worker: Press photographer, LCSW 12/06/2015 1:21 PM   Clinical Social Worker:Lauren Madie Reno 12/06/2015 1:21 PM   Other:  Gerline Legacy Nurse Case Manager 12/06/2015 1:21 PM   Other:   12/06/2015 1:21 PM   Other:   12/06/2015 1:21 PM   Other:  12/06/2015 1:21 PM   Other:  12/06/2015 1:21 PM   Other:  12/06/2015 1:21 PM    12/06/2015 1:21 PM    12/06/2015 1:21 PM    12/06/2015 1:21 PM    12/06/2015 1:21 PM    Scribe for Treatment Team:   Maxie Better, LCSW 12/06/2015 1:21 PM

## 2015-12-07 DIAGNOSIS — F332 Major depressive disorder, recurrent severe without psychotic features: Principal | ICD-10-CM

## 2015-12-07 MED ORDER — LOPERAMIDE HCL 2 MG PO CAPS
2.0000 mg | ORAL_CAPSULE | ORAL | Status: DC | PRN
  Administered 2015-12-07 – 2015-12-08 (×2): 2 mg via ORAL
  Filled 2015-12-07 (×2): qty 1

## 2015-12-07 MED ORDER — CYCLOBENZAPRINE HCL 10 MG PO TABS
ORAL_TABLET | ORAL | Status: AC
  Filled 2015-12-07: qty 1

## 2015-12-07 NOTE — Progress Notes (Signed)
Patient ID: Veronica Moore, female   DOB: Feb 17, 1996, 20 y.o.   MRN: [id number] [place] [facility] MD Progress Note  12/07/2015 4:17 PM Veronica Moore  MRN:  [id number] Subjective:  Patient reports" I feel awful and great at the same time".   Objective: Veronica Moore is awake, alert and oriented X4 , found interacting with peers in dayrooms.  Denies suicidal or homicidal ideation. Denies auditory or visual hallucination and does not appear to be responding to internal stimuli. Patient interacts well with staff and others. Patient reports she is medication compliant without mediation side effects. Patient reports that she is taken Megace for appetite stimulant and reports improvement.  Report learning new coping skills. States her depression 2/10.Reports good appetite and is resting well. Patient report she is excited regarding discharge to [facility] for treatment . Support, encouragement and reassurance was provided.  .  Principal Problem: Severe recurrent major depression without psychotic features (HCC) Diagnosis:   Patient Active Problem List   Diagnosis Date Noted  . Benzodiazepine abuse [F13.10] 12/03/2015  . Alcohol abuse [F10.10] 12/03/2015  . Severe recurrent major depression without psychotic features (HCC) [F33.2] 12/03/2015  . PTSD (post-traumatic stress disorder) [F43.10] 12/03/2015  . 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 ADHD (attention deficit hyperactivity disorder)   . Allergy     Past Surgical History  Procedure Laterality Date  . No past surgeries     Family History: History reviewed. No pertinent family history. Family Psychiatric  History: see admission H and P Social History:  History  Alcohol Use  . Yes    Comment: 6 pk daily     History  Drug Use  . Yes  .  Special: Marijuana    Social History   Social History  . Marital Status: Single    Spouse Name: N/A  . Number of Children: N/A  . Years of Education: N/A   Social History Main Topics  . Smoking status: Current Every Day Smoker -- 1.00 packs/day for 8 years    Types: Cigarettes  . Smokeless tobacco: None  . Alcohol Use: Yes     Comment: 6 pk daily  . Drug Use: Yes    Special: Marijuana  . Sexual Activity: Yes    Birth Control/ Protection: None   Other Topics Concern  . None   Social History Narrative   Pt does have a boyfriend not sexually active, has friends not much after school activities. Pt has a twin sister and a younger sister and brother.  Gets along well with family, [place] and doing education for the developmentally challenge.    Additional Social History:    Pain Medications: Oxy Prescriptions: no prescription Over the Counter: See MAR History of alcohol / drug use?: Yes Longest period of sobriety (when/how long): sab Negative Consequences of Use: Personal relationships, Work / Programmer, multimedia Withdrawal Symptoms: Sweats Name of Substance 1: sab 1 - Age of First Use: 13 1 - Amount (size/oz): 6 pk daily 1 - Frequency: daily 1 - Duration: Last year 1 - Last Use / Amount: sab Name of Substance 2: sab 2 - Age of First Use: 18 2 - Amount (size/oz): 1mg  daily or more if patient can acquire them    2 - Frequency: 3 to 4  times a week   2 - Duration: last year 2 - Last Use / Amount: 11/30/15 patient reports using 1mg  Name of Substance 3: sab 3 - Age of First Use: 13 3 - Amount (size/oz): 60.00 daily 3 - Frequency: daily 3 - Duration: last two years 3 - Last Use / Amount: today 12/02/15 Name of Substance 4: oxy 4 - Age of First Use: 18 4 - Frequency: "every once and a while" 4 - Last Use / Amount: 5 months            Sleep: Fair  Appetite:  Poor /Improving   Current Medications: Current Facility-Administered Medications   Medication Dose Route Frequency Provider Last Rate Last Dose  . acetaminophen (TYLENOL) tablet 650 mg  650 mg Oral Q6H PRN Leata Mouse, MD   650 mg at 12/03/15 1119  . alum & mag hydroxide-simeth (MAALOX/MYLANTA) 200-200-20 MG/5ML suspension 30 mL  30 mL Oral Q4H PRN Leata Mouse, MD      . amoxicillin-clavulanate (AUGMENTIN) 875-125 MG per tablet 1 tablet  1 tablet Oral Q12H Sanjuana Kava, NP   1 tablet at 12/07/15 1610  . ARIPiprazole (ABILIFY) tablet 2 mg  2 mg Oral Daily Rachael Fee, MD   2 mg at 12/07/15 9604  . feeding supplement (BOOST / RESOURCE BREEZE) liquid 1 Container  1 Container Oral BID BM Renie Ora, RD   1 Container at 12/07/15 1148  . feeding supplement (ENSURE ENLIVE) (ENSURE ENLIVE) liquid 237 mL  237 mL Oral q morning - 10a Anderson Malta Ostheim, RD   237 mL at 12/07/15 1149  . guaiFENesin (MUCINEX) 12 hr tablet 600 mg  600 mg Oral BID Sanjuana Kava, NP   600 mg at 12/07/15 5409  . Influenza vac split quadrivalent PF (FLUARIX) injection 0.5 mL  0.5 mL Intramuscular Tomorrow-1000 Rachael Fee, MD   0.5 mL at 12/03/15 1130  . magnesium hydroxide (MILK OF MAGNESIA) suspension 30 mL  30 mL Oral Daily PRN Leata Mouse, MD      . megestrol (MEGACE) 40 MG/ML suspension 400 mg  400 mg Oral Daily Rachael Fee, MD   400 mg at 12/07/15 0820  . mirtazapine (REMERON) tablet 15 mg  15 mg Oral QHS Rachael Fee, MD   15 mg at 12/06/15 2241  . multivitamin with minerals tablet 1 tablet  1 tablet Oral Daily Rachael Fee, MD   1 tablet at 12/07/15 [phone]  . nicotine (NICODERM CQ - dosed in mg/24 hours) patch 21 mg  21 mg Transdermal Q0600 Leata Mouse, MD   21 mg at 12/07/15 0820  . PARoxetine (PAXIL) tablet 20 mg  20 mg Oral Daily Rachael Fee, MD   20 mg at 12/07/15 1478  . phenol (CHLORASEPTIC) mouth spray 1 spray  1 spray Mouth/Throat QID PRN Sanjuana Kava, NP      . pneumococcal 23 valent vaccine (PNU-IMMUNE) injection 0.5 mL  0.5 mL  Intramuscular Tomorrow-1000 Rachael Fee, MD   0.5 mL at 12/03/15 1131  . thiamine (VITAMIN B-1) tablet 100 mg  100 mg Oral Daily Rachael Fee, MD   100 mg at 12/07/15 2956    Lab Results: No results found for this or any previous visit (from the past 48 hour(s)).  Physical Findings: AIMS: Facial and Oral Movements Muscles of Facial Expression: None, normal Lips and Perioral Area: None, normal Jaw: None, normal Tongue: None, normal,Extremity Movements Upper (arms, wrists, hands, fingers): None, normal  Lower (legs, knees, ankles, toes): None, normal, Trunk Movements Neck, shoulders, hips: None, normal, Overall Severity Severity of abnormal movements (highest score from questions above): None, normal Incapacitation due to abnormal movements: None, normal Patient's awareness of abnormal movements (rate only patient's report): No Awareness, Dental Status Current problems with teeth and/or dentures?: No Does patient usually wear dentures?: No  CIWA:  CIWA-Ar Total: 1 COWS:     Musculoskeletal: Strength & Muscle Tone: within normal limits Gait & Station: normal Patient leans: normal  Psychiatric Specialty Exam: Review of Systems  Constitutional: Positive for weight loss and malaise/fatigue.  HENT: Negative.   Eyes: Negative.   Respiratory: Negative.   Cardiovascular: Negative.   Gastrointestinal: Negative.   Genitourinary: Negative.   Musculoskeletal: Negative.   Skin: Negative.   Neurological: Negative.   Endo/Heme/Allergies: Negative.   Psychiatric/Behavioral: Positive for depression and substance abuse. The patient is nervous/anxious.   All other systems reviewed and are negative.   Blood pressure 129/92, pulse 101, temperature 98 F (36.7 C), temperature source Oral, resp. rate 16, height 5' (1.524 m), weight 42.638 kg (94 lb), SpO2 99 %.Body mass index is 18.36 kg/(m^2).  General Appearance: Casual and Guarded  Eye Contact::  Fair  Speech:  Clear and Coherent   Volume:  Decreased  Mood:  Anxious and Depressed  Affect:  anxious depressed labile  Thought Process:  Coherent and Goal Directed  Orientation:  Full (Time, Place, and Person)  Thought Content:  symptoms events worries concerns  Suicidal Thoughts:  No  Homicidal Thoughts:  No  Memory:  Immediate;   Fair Recent;   Fair Remote;   Fair  Judgement:  Fair  Insight:  Present and Shallow  Psychomotor Activity:  Normal  Concentration:  Fair  Recall:  [employer]  Fund of Knowledge:Fair  Language: Fair  Akathisia:  No  Handed:  Right  AIMS (if indicated):     Assets:  Desire for Improvement Financial Resources/Insurance Housing Resilience  ADL's:  Intact  Cognition: WNL  Sleep:  Number of Hours: 6     I agree with current treatment plan on 12/06/2014, Patient seen face-to-face for psychiatric evaluation follow-up, chart reviewed. Reviewed the information documented and agree with the treatment plan.   Treatment Plan Summary:  Daily contact with patient to assess and evaluate symptoms and progress in treatment and Medication management Supportive approach/coping skills Alcohol benzodiazepine abuse; continue to work a relapse prevention plan Depression/anxiety; continue the Paxil increase to 20 mg daily Continue the Abilify at 2 mg consider increasing to 5  Insomnia; will change the Trazodone to Remeron what could help with the decreased appetite as well as the anxiety and sleep Lack of appetite; will try Megace  Continue Remeron 15 mg for insomnia Will explore the residential treatment program she contacted and facilitate admission if appropriate   Oneta Rackanika N Lewis FNP- [facility] 12/07/2015, 4:17 PM  Reviewed the information documented and agree with the treatment plan.  JONNALAGADDA,JANARDHAHA R. 12/07/2015 6:26 PM

## 2015-12-07 NOTE — Progress Notes (Signed)
Patient ID: Veronica Moore, female   DOB: [birth year], 20 y.o.   MRN: [id number] D: "I want to do this for me, I want to get well and live a normal life". Patient reports her day has been "shity". Boyfriend visited and was upset about pt admission to an inpatient facility in [place]. Pt reports feeling anxious and not sleeping well. Pt denies SI/HI/AVH and pain.  Cooperative with assessment.  A: Met with pt 1:1. Medications administered as prescribed. Support and encouragement provided. R: Patient remains safe and complaint with medications. Pt reports she rescinded her 72 hr discharge because she wants to get well.

## 2015-12-07 NOTE — [facility] Group Notes (Signed)
[facility] Group Notes:  (Nursing/MHT/Case Management/Adjunct)  Date:  12/07/2015  Time:  2:59 PM  Type of Therapy:  Psychoeducational Skills  Participation Level:  Active  Participation Quality:  Appropriate  Affect:  Appropriate  Cognitive:  Appropriate  Insight:  Appropriate  Engagement in Group:  Engaged  Modes of Intervention:  Discussion  Summary of Progress/Problems: Pt did attend self inventory group.   Forrest, Shalita Shanta 12/07/2015, 2:59 PM 

## 2015-12-07 NOTE — Plan of Care (Signed)
Problem: Diagnosis: Increased Risk For Suicide Attempt Goal: STG-Patient Will Comply With Medication Regime Outcome: Progressing Pt complaint with medication     

## 2015-12-07 NOTE — [facility] Group Notes (Signed)
[facility] Group Notes: (Clinical Social Work)   12/07/2015      Type of Therapy:  Group Therapy   Participation Level:  Did Not Attend despite MHT prompting   Ambrose MantleMareida Grossman-Orr, LCSW 12/07/2015, 4:31 PM

## 2015-12-07 NOTE — Progress Notes (Signed)
Adult Psychoeducational Group Note  Date:  12/07/2015 Time:  9:46 PM  Group Topic/Focus:  Wrap-Up Group:   The focus of this group is to help patients review their daily goal of treatment and discuss progress on daily workbooks.  Participation Level:  Active  Participation Quality:  Appropriate and Attentive  Affect:  Appropriate  Cognitive:  Alert, Appropriate and Oriented  Insight: Appropriate  Engagement in Group:  Engaged  Modes of Intervention:  Discussion and Education  Additional Comments:  Pt attended and participated in group.  Pt stated her goal today was to practice patience.  Pt reported that she was tested multiple times today but ultimately she completed her goal.  Pt rated her day a 10/10.  Berlin Hunuttle, David M 12/07/2015, 9:46 PM

## 2015-12-07 NOTE — Progress Notes (Addendum)
Patient ID: Alfonse RasSydney Cadavid, female   DOB: 09/08/1996, 20 y.o.   MRN: [id number]  Pt currently presents with a anxious affect and behavior. Per self inventory, pt rates depression at a 4, hopelessness 6 and anxiety 7. Pt's daily goal is to "be patient" and they intend to do so by "stay cool and calm, breathe." Pt reports good sleep, a fair appetite, normal energy and good concentration. Pt reports that she hopes to attend "[facility] [place]  .  Pt provided with medications per providers orders. Pt's labs and vitals were monitored throughout the day. Pt supported emotionally and encouraged to express concerns and questions. Pt educated on medications and alternative stress techniques. Encouraged fluid and dietary intake.   Pt's safety ensured with 15 minute and environmental checks. Pt currently denies SI/HI and A/V hallucinations. Pt verbally agrees to seek staff if SI/HI or A/VH occurs and to consult with staff before acting on these thoughts. Will continue POC.

## 2015-12-07 NOTE — [facility] Group Notes (Signed)
[facility] Group Notes:  (Nursing/MHT/Case Management/Adjunct)  Date:  12/07/2015  Time:  3:00 PM  Type of Therapy:  Psychoeducational Skills  Participation Level:  Active  Participation Quality:  Appropriate  Affect:  Appropriate  Cognitive:  Appropriate  Insight:  Appropriate  Engagement in Group:  Engaged  Modes of Intervention:  Discussion  Summary of Progress/Problems: Pt did attend self inventory group.  Veronica Moore, Veronica Moore 12/07/2015, 3:00 PM

## 2015-12-08 MED ORDER — GUANFACINE HCL 1 MG PO TABS
1.0000 mg | ORAL_TABLET | Freq: Once | ORAL | Status: AC
  Administered 2015-12-08: 1 mg via ORAL
  Filled 2015-12-08: qty 1

## 2015-12-08 MED ORDER — CLONIDINE HCL 0.1 MG PO TABS
0.1000 mg | ORAL_TABLET | Freq: Once | ORAL | Status: DC
  Filled 2015-12-08: qty 1

## 2015-12-08 MED ORDER — GUANFACINE HCL 1 MG PO TABS: 1.0000 mg | ORAL_TABLET | Freq: Every day | ORAL | Status: DC

## 2015-12-08 MED ORDER — LORAZEPAM 0.5 MG PO TABS
0.5000 mg | ORAL_TABLET | Freq: Two times a day (BID) | ORAL | Status: AC
  Administered 2015-12-08: 0.5 mg via ORAL
  Filled 2015-12-08: qty 1

## 2015-12-08 NOTE — [facility] Group Notes (Signed)
.  noat   

## 2015-12-08 NOTE — [facility] Group Notes (Signed)
[facility] Group Notes:  (Nursing/MHT/Case Management/Adjunct)  Date:  12/08/2015  Time:  4:06 PM   Type of Therapy:  Psychoeducational Skills  Participation Level:  Active  Participation Quality:  Appropriate  Affect:  Appropriate  Cognitive:  Appropriate  Insight:  Appropriate  Engagement in Group:  Engaged  Modes of Intervention:  Problem-solving  Summary of Progress/Problems: Summary of Progress/Problems: Topic was on leisure and lifestyle changes. Discussed the important of choosing healthy leisure activities. Group encouraged to surround themselves with positive and healthy group/support system when changing to a healthy life style.   Bethann PunchesJane O Ochieng 12/08/2015, 4:06 PM

## 2015-12-08 NOTE — Progress Notes (Signed)
Pt currently presents with a flat affect and depressed behavior. Per self inventory, pt rates depression at a  5, hopelessness 5 and anxiety 6. Pt's daily goal is to "staying calm and looking [place] [facility]" and  intend to do so by " going to the group" Pt reports  Fair sleep, a fair  appetite, low energy and good concentration. Pt's safety ensured with 15 minute and environmental checks. Pt currently denies SI/HI and A/V hallucinations. Pt verbally agrees to seek staff if SI/HI or A/VH occurs and to consult with staff before acting on these thoughts. Will continue POC.

## 2015-12-08 NOTE — Progress Notes (Signed)
  D: Patient with bright affect and is noted to laugh with peers in the milieu. Pt states she is very excited because she has a place to go [place] that [place] family set up and that she now feels hopeful about her future. Pt attended group session. A: Q 15 minute safety checks, encourage staff/peer interaction and medication compliance.  R: Pt compliant with HS medications and denies SI/HI or plans to harm herself at this time. No s/s of distress noted.

## 2015-12-08 NOTE — Progress Notes (Signed)
[facility]  12/08/2015 1:36 PM Veronica Moore  MRN:  [id number] Subjective:  Patient reports" I am feeling like crap my blood pressure is elevated today".   Objective: Veronica Moore is awake, alert and oriented X4 , found interacting with peers in dayrooms.  Denies suicidal or homicidal ideation. Denies auditory or visual hallucination and does not appear to be responding to internal stimuli. Patient interacts well with staff and others. Patient reports she is medication compliant without mediation side effects. Patient reports that she is taken Megace for appetite stimulant and reports improvement.  Patient reports a recent increased in her blood pressure that is causing her to worry. States her depression 3/10.Reports good appetite and is resting well. Patient report she is still excited regarding discharge [facility] [place] [facility] [place] . Support, encouragement and reassurance was provided.  .  Principal Problem: Severe recurrent major depression without psychotic features (HCC) Diagnosis:   Patient Active Problem List   Diagnosis Date Noted  . Benzodiazepine abuse [F13.10] 12/03/2015  . Alcohol abuse [F10.10] 12/03/2015  . Severe recurrent major depression without psychotic features (HCC) [F33.2] 12/03/2015  . PTSD (post-traumatic stress disorder) [F43.10] 12/03/2015  . 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 ADHD (attention deficit hyperactivity disorder)   . Allergy     Past Surgical History  Procedure Laterality Date  . No past surgeries     Family History: History reviewed. No pertinent family history. Family Psychiatric  History: see admission H and P Social History:  History  Alcohol Use  . Yes    Comment: 6 pk daily     History  Drug Use  . Yes  .  Special: Marijuana    Social History   Social History  . Marital Status: Single    Spouse Name: N/A  . Number of Children: N/A  . Years of Education: N/A   Social History Main Topics  . Smoking status: Current Every Day Smoker -- 1.00 packs/day for 8 years    Types: Cigarettes  . Smokeless tobacco: None  . Alcohol Use: Yes     Comment: 6 pk daily  . Drug Use: Yes    Special: Marijuana  . Sexual Activity: Yes    Birth Control/ Protection: None   Other Topics Concern  . None   Social History Narrative   Pt does have a boyfriend not sexually active, has friends not much after school activities. Pt has a twin sister and a younger sister and brother.  Gets along well with family, thinking of staying in state for college and doing education for the developmentally challenge.    Additional Social History:    Pain Medications: Oxy Prescriptions: no prescription Over the Counter: See MAR History of alcohol / drug use?: Yes Longest period of sobriety (when/how long): sab Negative Consequences of Use: Personal relationships, Work / Programmer, multimediachool Withdrawal Symptoms: Sweats Name of Substance 1: sab 1 - Age of First Use: 13 1 - Amount (size/oz): 6 pk daily 1 - Frequency: daily 1 - Duration: Last year 1 - Last Use / Amount: sab Name of Substance 2: sab 2 - Age of First Use: 18 2 - Amount (size/oz): 1mg  daily or more if patient can acquire them    2 - Frequency: 3 to 4 times a  week   2 - Duration: last year 2 - Last Use / Amount: 11/30/15 patient reports using 1mg  Name of Substance 3: sab 3 - Age of First Use: 13 3 - Amount (size/oz): 60.00 daily 3 - Frequency: daily 3 - Duration: last two years 3 - Last Use / Amount: today 12/02/15 Name of Substance 4: oxy 4 - Age of First Use: 18 4 - Frequency: "every once and a while" 4 - Last Use / Amount: 5 months            Sleep: Fair  Appetite:  Poor /Improving   Current Medications: Current Facility-Administered Medications   Medication Dose Route Frequency Provider Last Rate Last Dose  . acetaminophen (TYLENOL) tablet 650 mg  650 mg Oral Q6H PRN Leata Mouse, MD   650 mg at 12/03/15 1119  . alum & mag hydroxide-simeth (MAALOX/MYLANTA) 200-200-20 MG/5ML suspension 30 mL  30 mL Oral Q4H PRN Leata Mouse, MD      . amoxicillin-clavulanate (AUGMENTIN) 875-125 MG per tablet 1 tablet  1 tablet Oral Q12H Sanjuana Kava, NP   1 tablet at 12/08/15 0818  . ARIPiprazole (ABILIFY) tablet 2 mg  2 mg Oral Daily Rachael Fee, MD   2 mg at 12/08/15 0818  . feeding supplement (BOOST / RESOURCE BREEZE) liquid 1 Container  1 Container Oral BID BM Renie Ora, RD   1 Container at 12/07/15 1620  . feeding supplement (ENSURE ENLIVE) (ENSURE ENLIVE) liquid 237 mL  237 mL Oral q morning - 10a Anderson Malta [place], RD   237 mL at 12/07/15 1149  . guaiFENesin (MUCINEX) 12 hr tablet 600 mg  600 mg Oral BID Sanjuana Kava, NP   600 mg at 12/08/15 0818  . Influenza vac split quadrivalent PF (FLUARIX) injection 0.5 mL  0.5 mL Intramuscular Tomorrow-1000 Rachael Fee, MD   0.5 mL at 12/03/15 1130  . loperamide (IMODIUM) capsule 2-4 mg  2-4 mg Oral PRN Oneta Rack, NP   2 mg at 12/08/15 0412  . magnesium hydroxide (MILK OF MAGNESIA) suspension 30 mL  30 mL Oral Daily PRN Leata Mouse, MD      . megestrol (MEGACE) 40 MG/ML suspension 400 mg  400 mg Oral Daily Rachael Fee, MD   400 mg at 12/08/15 0819  . mirtazapine (REMERON) tablet 15 mg  15 mg Oral QHS Rachael Fee, MD   15 mg at 12/07/15 2050  . multivitamin with minerals tablet 1 tablet  1 tablet Oral Daily Rachael Fee, MD   1 tablet at 12/08/15 0818  . nicotine (NICODERM CQ - dosed in mg/24 hours) patch 21 mg  21 mg Transdermal Q0600 Leata Mouse, MD   21 mg at 12/08/15 0820  . PARoxetine (PAXIL) tablet 20 mg  20 mg Oral Daily Rachael Fee, MD   20 mg at 12/08/15 0818  . phenol (CHLORASEPTIC) mouth spray 1 spray  1 spray Mouth/Throat QID PRN  Sanjuana Kava, NP      . pneumococcal 23 valent vaccine (PNU-IMMUNE) injection 0.5 mL  0.5 mL Intramuscular Tomorrow-1000 Rachael Fee, MD   0.5 mL at 12/03/15 1131  . thiamine (VITAMIN B-1) tablet 100 mg  100 mg Oral Daily Rachael Fee, MD   100 mg at 12/08/15 0818    Lab Results: No results found for this or any previous visit (from the past 48 hour(s)).  Physical Findings: AIMS: Facial and Oral Movements Muscles of Facial Expression: None,  normal Lips and Perioral Area: None, normal Jaw: None, normal Tongue: None, normal,Extremity Movements Upper (arms, wrists, hands, fingers): None, normal Lower (legs, knees, ankles, toes): None, normal, Trunk Movements Neck, shoulders, hips: None, normal, Overall Severity Severity of abnormal movements (highest score from questions above): None, normal Incapacitation due to abnormal movements: None, normal Patient's awareness of abnormal movements (rate only patient's report): No Awareness, Dental Status Current problems with teeth and/or dentures?: No Does patient usually wear dentures?: No  CIWA:  CIWA-Ar Total: 0 COWS:     Musculoskeletal: Strength & Muscle Tone: within normal limits Gait & Station: normal Patient leans: normal  Psychiatric Specialty Exam: Review of Systems  Constitutional: Positive for weight loss and malaise/fatigue.  HENT: Negative.   Eyes: Negative.   Respiratory: Negative.   Cardiovascular: Negative.   Gastrointestinal: Negative.  Negative for nausea, vomiting and abdominal pain.  Genitourinary: Negative.   Musculoskeletal: Negative.   Skin: Negative.   Neurological: Negative.  Negative for headaches.  Endo/Heme/Allergies: Negative.   Psychiatric/Behavioral: Positive for depression and substance abuse. The patient is nervous/anxious.   All other systems reviewed and are negative.   Blood pressure 140/92, pulse 107, temperature 98.6 F (37 C), temperature source Oral, resp. rate 14, height 5' (1.524 m),  weight 42.638 kg (94 lb), SpO2 99 %.Body mass index is 18.36 kg/(m^2).  General Appearance: Casual and Guarded  Eye Contact::  Fair  Speech:  Clear and Coherent  Volume:  Decreased  Mood:  Anxious and Depressed  Affect:  anxious depressed labile  Thought Process:  Coherent and Goal Directed  Orientation:  Full (Time, Place, and Person)  Thought Content:  symptoms events worries concerns regarding blood pressure which is causing patient to experience increased anxiety  Suicidal Thoughts:  No  Homicidal Thoughts:  No  Memory:  Immediate;   Fair Recent;   Fair Remote;   Fair  Judgement:  Fair  Insight:  Present and Shallow  Psychomotor Activity:  Normal  Concentration:  Fair  Recall:  [employer] of Knowledge:Fair  Language: Fair  Akathisia:  No  Handed:  Right  AIMS (if indicated):     Assets:  Desire for Improvement Financial Resources/Insurance Housing Social Support  ADL's:  Intact  Cognition: WNL  Sleep:  Number of Hours: 6.25     I agree with current treatment plan on 12/07/2014, Patient seen face-to-face for psychiatric evaluation follow-up, chart reviewed. Reviewed the information documented and agree with the treatment plan.   Treatment Plan Summary: Daily contact with patient to assess and evaluate symptoms and progress in treatment and Medication management Supportive approach/coping skills Alcohol benzodiazepine abuse; continue to work a relapse prevention plan Depression/anxiety; Continue Paxil increase to 20 mg daily Continue the Abilify at 2 mg consider increasing to 5  Insomnia; will change the Trazodone to Remeron what could help with the decreased appetite as well as the anxiety and sleep Lack of appetite; will try Megace  Continue Remeron 15 mg for insomnia Start Ativan 0.5mg  PO BID for Anxiety Tenex 1 mg for elevated blood pressure 1 X dose. reassess vitals in 1 hour. Will explore the residential treatment program she contacted and facilitate admission  if appropriate   Oneta Rack FNP- [facility] 12/08/2015, 1:36 PM  Reviewed the information documented and agree with the treatment plan.  JONNALAGADDA,JANARDHAHA R. 12/08/2015 2:56 PM

## 2015-12-08 NOTE — Progress Notes (Signed)
Adult Psychoeducational Group Note  Date:  12/08/2015 Time:  9:21 PM  Group Topic/Focus:  Wrap-Up Group:   The focus of this group is to help patients review their daily goal of treatment and discuss progress on daily workbooks.  Participation Level:  Active  Participation Quality:  Appropriate and Attentive  Affect:  Appropriate  Cognitive:  Appropriate  Insight: Appropriate  Engagement in Group:  Engaged  Modes of Intervention:  Discussion  Additional Comments:  Pt stated something positive that happened today is her blood pressure went down. Pt stated tomorrow she wants to talk to the social worker about rehab options.  Caswell CorwinOwen, Dana C 12/08/2015, 9:21 PM

## 2015-12-09 MED ORDER — HYDROXYZINE HCL 25 MG PO TABS
ORAL_TABLET | ORAL | Status: AC
  Filled 2015-12-09: qty 1

## 2015-12-09 MED ORDER — HYDROXYZINE HCL 25 MG PO TABS
25.0000 mg | ORAL_TABLET | ORAL | Status: DC | PRN
  Administered 2015-12-09 – 2015-12-10 (×5): 25 mg via ORAL
  Filled 2015-12-09 (×4): qty 1

## 2015-12-09 MED ORDER — HYDROXYZINE HCL 25 MG PO TABS: 25.0000 mg | ORAL_TABLET | Freq: Four times a day (QID) | ORAL | Status: DC | PRN

## 2015-12-09 MED ORDER — TRAZODONE HCL 100 MG PO TABS
100.0000 mg | ORAL_TABLET | Freq: Every evening | ORAL | Status: DC | PRN
  Administered 2015-12-09: 100 mg via ORAL
  Filled 2015-12-09: qty 1

## 2015-12-09 NOTE — Progress Notes (Signed)
Patient up and visible in the milieu. Rating her depression and anxiety both at a 5/10 and hopelessness at a 4/10. States her goal is to work on her recovery plan. Patient came out of group tearful, anxious. Unwilling to disclose reason however peer states patient shared difficult things in group which patient confirmed. Patient reports vivid dreams with use of remeron and desires a change. MD d/c med. Order received for vistaril 25mg  po prn for anxiety which will be given after pharmacy verification. Emotional support provided and 1:1 time offered however patient states she does not want to talk at this time. She denies SI/HI and remains safe on level III obs. Will continue to monitor closely. Veronica MarseillesFriedman, Veronica Moore

## 2015-12-09 NOTE — Progress Notes (Signed)
Pt attended spiritual care group on grief and loss facilitated by counseling intern Samara DeistKathryn Beam. Group opened with brief discussion and psycho-social ed around grief and loss in relationships and in relation to self - identifying life patterns, circumstances, changes that cause losses. Established group norm of speaking from own life experience. Group goal of establishing open and affirming space for members to share loss and experience with grief, normalize grief experience and provide psycho social education and grief support. Group members processed their own feelings of grief and loss with the group. Group was closed with a reminder that the counseling intern is available to meet one on one and to be mindful of the effect grief and loss group has throughout the day.  Pt was alert and oriented x4 with appropriate affect and depressed mood. She was an active participant during group. Pt stated that her feelings of loss are related to the death of her aunt, who pt reports was a very important figure in her life. Pt's aunt helped to raise her and her death was unexpected. Pt also reports feelings of grief and loss due to a miscarriage at 13 weeks that occurred the same week. Pt discussed her feelings of grief and loss with the group, including her feelings of grief, anxiety, and sadness. Pt was tearful as she shared her grief experience. She says she feels very close to the group members and supported by them in her grief. Pt reports making meaning out of her grief through her cousin giving birth to a baby girl who looks and acts much like her aunt. Pt states she is very ready to move on and improve her life. Throughout group pt was supportive of other group members.   Graciela HusbandsKathryn Beam Counseling Intern

## 2015-12-09 NOTE — Progress Notes (Signed)
Recreation Therapy Notes  Date: 01.09.2017 Time: 9:30am Location: [address] Dayroom   Group Topic: Stress Management  Goal Area(s) Addresses:  Patient will actively participate in stress management techniques presented during session.   Behavioral Response: Did not attend.    Denise L Blanchfield, LRT/CTRS        Blanchfield, Denise L 12/09/2015 3:00 PM 

## 2015-12-09 NOTE — [facility] Group Notes (Signed)
[facility]   12/09/2015 9:38 AM  Participation Quality:  Appropriate   Mood/Affect:  Appropriate  Depression Rating:  5  Anxiety Rating:  6  Thoughts of Suicide:  No Will you contract for safety?   NA  Current AVH:  No  Plan for Discharge/Comments:  Pt states nightmares with Remeron and wants sleeping medication changed. [facility] [place].   Transportation Means: unknown-possibly tx center will provide transport.   Supports: parents are limited supports   Smart, Conservation officer, natureHeather LCSW

## 2015-12-09 NOTE — Progress Notes (Signed)
Adult Psychoeducational Group Note  Date:  12/09/2015 Time:  9:41 PM  Group Topic/Focus:  Wrap-Up Group:   The focus of this group is to help patients review their daily goal of treatment and discuss progress on daily workbooks.  Participation Level:  Active  Participation Quality:  Appropriate and Attentive  Affect:  Appropriate  Cognitive:  Appropriate  Insight: Appropriate  Engagement in Group:  Engaged  Modes of Intervention:  Discussion  Additional Comments:  Pt stated she had a great day. Her goal was to eat more, which she did. Tomorrow she wants to work on having patience before leaving the hospital, and to finalize her rehab plans.  Caswell CorwinOwen, Dana C 12/09/2015, 9:41 PM

## 2015-12-09 NOTE — Plan of Care (Signed)
Problem: Alteration in mood Goal: STG-Patient reports thoughts of self-harm to staff Outcome: Progressing Patient denies thoughts to self harm, no SI.  Problem: Alteration in mood & ability to function due to Goal: STG-Patient will comply with prescribed medication regimen (Patient will comply with prescribed medication regimen)  Outcome: Progressing Patient is med compliant.

## 2015-12-09 NOTE — Progress Notes (Signed)
D: Pt has appropriate affect and pleasant mood.  When asked about her day, pt states it was "terrible at the beginning, I wasn't feeling good at all, around lunch my blood pressure went down so I feel a lot better than I did."  Pt denies SI/HI, reports auditory hallucinations of "whispers", denies pain.  Pt reports her auditory hallucinations are "not as bad anymore."  Pt has been visible in milieu interacting with peers and staff appropriately.  Pt attended evening group.   A: Introduced self to pt.  Met with pt 1:1 and provided support and encouragement.  Actively listened to pt.  Medications administered per order.   R: Pt is compliant with medications.  Pt verbally contracts for safety.  Will continue to monitor and assess.

## 2015-12-09 NOTE — Progress Notes (Signed)
[facility]  12/09/2015 3:45 PM Veronica RasSydney Moore  MRN:  [id number] Subjective:  Veronica AlesSydney is still struggling. She is still having some episodes of anxiety-panic-crying but overall she states she is slowly feeling better. She [place] able [place]. States he has this connection with one of the counselors-administrators who know her mother and he he himself is in Recovery. States that if she was there she would be closer to family to whom she is very closed and would look forward to reconnect Principal Problem: Severe recurrent major depression without psychotic features (HCC) Diagnosis:   Patient Active Problem List   Diagnosis Date Noted  . Benzodiazepine abuse [F13.10] 12/03/2015  . Alcohol abuse [F10.10] 12/03/2015  . Severe recurrent major depression without psychotic features (HCC) [F33.2] 12/03/2015  . PTSD (post-traumatic stress disorder) [F43.10] 12/03/2015  . 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 ADHD (attention deficit hyperactivity disorder)   . Allergy     Past Surgical History  Procedure Laterality Date  . No past surgeries     Family History: History reviewed. No pertinent family history. Family Psychiatric  History: see admission H and P Social History:  History  Alcohol Use  . Yes    Comment: 6 pk daily     History  Drug Use  . Yes  . Special: Marijuana    Social History   Social History  . Marital Status: Single    Spouse Name: N/A  . Number of Children: N/A  . Years of Education: N/A   Social History Main Topics  . Smoking status: Current Every Day Smoker -- 1.00 packs/day for 8 years    Types: Cigarettes  . Smokeless tobacco: None  . Alcohol Use: Yes     Comment: 6 pk daily  . Drug Use: Yes    Special: Marijuana  . Sexual Activity: Yes     Birth Control/ Protection: None   Other Topics Concern  . None   Social History Narrative   Pt does have a boyfriend not sexually active, has friends not much after school activities. Pt has a twin sister and a younger sister and brother.  Gets along well with family, thinking of staying in state for college and doing education for the developmentally challenge.    Additional Social History:    Pain Medications: Oxy Prescriptions: no prescription Over the Counter: See MAR History of alcohol / drug use?: Yes Longest period of sobriety (when/how long): sab Negative Consequences of Use: Personal relationships, Work / Programmer, multimediachool Withdrawal Symptoms: Sweats Name of Substance 1: sab 1 - Age of First Use: 13 1 - Amount (size/oz): 6 pk daily 1 - Frequency: daily 1 - Duration: Last year 1 - Last Use / Amount: sab Name of Substance 2: sab 2 - Age of First Use: 18 2 - Amount (size/oz): 1mg  daily or more if patient can acquire them    2 - Frequency: 3 to 4 times a week   2 - Duration: last year 2 - Last Use / Amount: 11/30/15 patient reports using 1mg  Name of Substance 3: sab 3 - Age of First Use: 13 3 - Amount (size/oz): 60.00 daily 3 - Frequency: daily 3 - Duration: last two years 3 - Last Use / Amount: today 12/02/15 Name of Substance 4:  oxy 4 - Age of First Use: 18 4 - Frequency: "every once and a while" 4 - Last Use / Amount: 5 months            Sleep: Fair  Appetite:  Fair  Current Medications: Current Facility-Administered Medications  Medication Dose Route Frequency Provider Last Rate Last Dose  . acetaminophen (TYLENOL) tablet 650 mg  650 mg Oral Q6H PRN Leata Mouse, MD   650 mg at 12/03/15 1119  . alum & mag hydroxide-simeth (MAALOX/MYLANTA) 200-200-20 MG/5ML suspension 30 mL  30 mL Oral Q4H PRN Leata Mouse, MD      . amoxicillin-clavulanate (AUGMENTIN) 875-125 MG per tablet 1 tablet  1 tablet Oral Q12H Sanjuana Kava, NP   1 tablet at  12/09/15 0810  . ARIPiprazole (ABILIFY) tablet 2 mg  2 mg Oral Daily Rachael Fee, MD   2 mg at 12/09/15 [phone]  . feeding supplement (BOOST / RESOURCE BREEZE) liquid 1 Container  1 Container Oral BID BM Renie Ora, RD   1 Container at 12/09/15 1135  . feeding supplement (ENSURE ENLIVE) (ENSURE ENLIVE) liquid 237 mL  237 mL Oral q morning - 10a Anderson Malta [place], RD   237 mL at 12/07/15 1149  . guaiFENesin (MUCINEX) 12 hr tablet 600 mg  600 mg Oral BID Sanjuana Kava, NP   600 mg at 12/09/15 0810  . hydrOXYzine (ATARAX/VISTARIL) tablet 25 mg  25 mg Oral Q4H PRN Rachael Fee, MD   25 mg at 12/09/15 1135  . Influenza vac split quadrivalent PF (FLUARIX) injection 0.5 mL  0.5 mL Intramuscular Tomorrow-1000 Rachael Fee, MD   0.5 mL at 12/03/15 1130  . loperamide (IMODIUM) capsule 2-4 mg  2-4 mg Oral PRN Oneta Rack, NP   2 mg at 12/08/15 0412  . magnesium hydroxide (MILK OF MAGNESIA) suspension 30 mL  30 mL Oral Daily PRN Leata Mouse, MD      . megestrol (MEGACE) 40 MG/ML suspension 400 mg  400 mg Oral Daily Rachael Fee, MD   400 mg at 12/09/15 0809  . multivitamin with minerals tablet 1 tablet  1 tablet Oral Daily Rachael Fee, MD   1 tablet at 12/09/15 [phone]  . nicotine (NICODERM CQ - dosed in mg/24 hours) patch 21 mg  21 mg Transdermal Q0600 Leata Mouse, MD   21 mg at 12/09/15 0810  . PARoxetine (PAXIL) tablet 20 mg  20 mg Oral Daily Rachael Fee, MD   20 mg at 12/09/15 [phone]  . phenol (CHLORASEPTIC) mouth spray 1 spray  1 spray Mouth/Throat QID PRN Sanjuana Kava, NP      . pneumococcal 23 valent vaccine (PNU-IMMUNE) injection 0.5 mL  0.5 mL Intramuscular Tomorrow-1000 Rachael Fee, MD   0.5 mL at 12/03/15 1131  . thiamine (VITAMIN B-1) tablet 100 mg  100 mg Oral Daily Rachael Fee, MD   100 mg at 12/09/15 8119  . traZODone (DESYREL) tablet 100 mg  100 mg Oral QHS PRN Rachael Fee, MD        Lab Results: No results found for this or any previous visit (from  the past 48 hour(s)).  Physical Findings: AIMS: Facial and Oral Movements Muscles of Facial Expression: None, normal Lips and Perioral Area: None, normal Jaw: None, normal Tongue: None, normal,Extremity Movements Upper (arms, wrists, hands, fingers): None, normal Lower (legs, knees, ankles, toes): None, normal, Trunk Movements Neck, shoulders, hips: None, normal, Overall Severity Severity of  abnormal movements (highest score from questions above): None, normal Incapacitation due to abnormal movements: None, normal Patient's awareness of abnormal movements (rate only patient's report): No Awareness, Dental Status Current problems with teeth and/or dentures?: No Does patient usually wear dentures?: No  CIWA:  CIWA-Ar Total: 0 COWS:     Musculoskeletal: Strength & Muscle Tone: within normal limits Gait & Station: normal Patient leans: normal  Psychiatric Specialty Exam: Review of Systems  Constitutional: Negative.   HENT: Negative.   Eyes: Negative.   Respiratory: Negative.   Cardiovascular: Negative.   Gastrointestinal: Negative.   Genitourinary: Negative.   Musculoskeletal: Negative.   Skin: Negative.   Neurological: Negative.   Endo/Heme/Allergies: Negative.   Psychiatric/Behavioral: Positive for depression and substance abuse. The patient is nervous/anxious.     Blood pressure 136/88, pulse 74, temperature 98 F (36.7 C), temperature source Oral, resp. rate 16, height 5' (1.524 m), weight 42.638 kg (94 lb), SpO2 99 %.Body mass index is 18.36 kg/(m^2).  General Appearance: Fairly Groomed  Patent attorney::  Fair  Speech:  Clear and Coherent  Volume:  Decreased  Mood:  Anxious and Depressed  Affect:  Tearful  Thought Process:  Coherent and Goal Directed  Orientation:  Full (Time, Place, and Person)  Thought Content:  symptoms events worries concerns  Suicidal Thoughts:  No  Homicidal Thoughts:  No  Memory:  Immediate;   Fair Recent;   Fair Remote;   Fair  Judgement:   Fair  Insight:  Present and Shallow  Psychomotor Activity:  Restlessness  Concentration:  Fair  Recall:  [employer] of Knowledge:Fair  Language: Fair  Akathisia:  No  Handed:  Right  AIMS (if indicated):     Assets:  Desire for Improvement Social Support  ADL's:  Intact  Cognition: WNL  Sleep:  Number of Hours: 6.75   Treatment Plan Summary: Daily contact with patient to assess and evaluate symptoms and progress in treatment and Medication management  Supportive approach/coping skills Alcohol-benzodiazepine abuse; continue to work a relapse prevention plan Depression; continue the Paxil with Abilify augmentation Insomnia; continue to work with the Trazodone 100 mg HS Decreased appetite; continue the Megace Insomnia; continue the Trazodone 100 mg HS Work with CBT/mindfulness Facilitate admission to the residential treatment program in [place]  LUGO,IRVING A 12/09/2015, 3:45 PM

## 2015-12-09 NOTE — Plan of Care (Signed)
Problem: Alteration in mood Goal: LTG-Patient reports reduction in suicidal thoughts (Patient reports reduction in suicidal thoughts and is able to verbalize a safety plan for whenever patient is feeling suicidal)  Outcome: Progressing Pt denies SI and verbally contracts for safety.       

## 2015-12-09 NOTE — [facility] Group Notes (Signed)
[facility] LCSW Group Therapy  12/09/2015 1:27 PM  Type of Therapy:  Group Therapy  Participation Level:  Active  Participation Quality:  Attentive  Affect:  Appropriate  Cognitive:  Alert and Oriented  Insight:  Engaged  Engagement in Therapy:  Improving  Modes of Intervention:  Confrontation, Discussion, Education, Exploration, Problem-solving, Rapport Building, Socialization and Support  Summary of Progress/Problems: Today's Topic: Overcoming Obstacles. Patients identified one short term goal and potential obstacles in reaching this goal. Patients processed barriers involved in overcoming these obstacles. Patients identified steps necessary for overcoming these obstacles and explored motivation (internal and external) for facing these difficulties head on. Veronica Moore shared that her bigget obstacle is getting into a treatment center. Veronica Moore reports that she is working with her biological mother to get [place]. Veronica Moore continues to show improving insight and progress in the group setting. She stated that she will call her mother after group and follow-up with CSW afterward.   Smart, Heather LCSW 12/09/2015, 1:27 PM

## 2015-12-09 NOTE — Progress Notes (Addendum)
[facility] [phone]) per his request. Pt's mother worked out payment of deductible and pt completed phone prescreen 12/09/2015 3:22 PM   Trula SladeHeather Smart, MSW, LCSW Clinical Social Worker 12/09/2015 3:22 PM   Clinicals received by Moore-in review. He will leave message for CSW this evening and touch base in the morning.  Trula SladeHeather Smart, MSW, LCSW Clinical Social Worker 12/09/2015 3:38 PM

## 2015-12-10 MED ORDER — PAROXETINE HCL 20 MG PO TABS: 20.0000 mg | ORAL_TABLET | Freq: Every day | ORAL | Status: DC

## 2015-12-10 MED ORDER — HYDROXYZINE HCL 25 MG PO TABS: 25.0000 mg | ORAL_TABLET | ORAL | Status: DC | PRN

## 2015-12-10 MED ORDER — NICOTINE 21 MG/24HR TD PT24: 21.0000 mg | MEDICATED_PATCH | Freq: Every day | TRANSDERMAL | Status: DC

## 2015-12-10 MED ORDER — ARIPIPRAZOLE 2 MG PO TABS: 2.0000 mg | ORAL_TABLET | Freq: Every day | ORAL | Status: DC

## 2015-12-10 MED ORDER — MEGESTROL ACETATE 40 MG/ML PO SUSP: 400.0000 mg | Freq: Every day | ORAL | Status: DC

## 2015-12-10 MED ORDER — TRAZODONE HCL 100 MG PO TABS: 100.0000 mg | ORAL_TABLET | Freq: Every evening | ORAL | Status: DC | PRN

## 2015-12-10 NOTE — Tx Team (Signed)
Interdisciplinary Treatment Plan Update (Adult)  Date:  12/10/2015  Time Reviewed:  9:37 AM   Progress in Treatment: Attending groups: Yes Participating in groups:  Yes Taking medication as prescribed:  Yes. Tolerating medication:  Yes. Family/Significant othe contact made:  SPE completed with pt, as she refused to consent to family contact.  Patient understands diagnosis:  Yes. and As evidenced by:  seeking treatment for anxiety/depression, medication stabilization, ETOH/THC abuse. Discussing patient identified problems/goals with staff:  Yes. Medical problems stabilized or resolved:  Yes. Denies suicidal/homicidal ideation: Yes. Issues/concerns per patient self-inventory:  Other:    Discharge Plan or Barriers:  Pt declined at [facility]. Accepted at [facility]. Alex (admissions) is working with pt and her mother to get plane ticket for this evening. Pt d/cing today to pack.   Reason for Continuation of Hospitalization: none  Comments:  Veronica Moore is Veronica 20 y.o. female presenting this date with her father who brought her to [facility] due to patient having SI with a plan to harm herself. Collateral information from father states patient has not been out of her bed in two days and has not eaten. Patient was found this morning by her father in her room stating she had thoughts of self harm and was going to overdose on drugs. Patient states she has been depressed for the last month and rated her depression at a 10 at the time of this assessment. Patient reports ongoing anxiety rating her symptoms at a 8 at the time of assessment. Patient reports ongoing SA issues and daily use of THC where patient reports that she is using up to 3 grams of Cannabis daily and also using alcohol (3or 4 8 oz. Beers) three to four times a week with last use being 11/30/15 where she reported consuming 3 8 oz. beers. Patient also has a history of IV cocaine use reporting last use 3 months  ago, but denies use since then. Patient reports a history of depression and SA abuse in her family with patient stating she attended outpatient treatment with her mother in 2014 where she attended groups for three weeks. Patient denies any MH diagnosis but did state she felt she has ADHD and has taken medication in the past. Patient denies taking any current medication/s but is open to medication interventions to assist with depression/anxiety. Patient has had one noted suicide attempt in 2015 where she admits to cutting her right inner thigh while taking a shower bur her partner at that time stopped her. 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 Patient verbalized understanding and agreed to treatment plan. CSW reviewed [facility] "Discharge Process and Patient Involvement" Form. Pt verbalized understanding of information provided and signed form.    Review of initial/current patient goals per problem list:  1. Goal(s): Patient will participate in aftercare plan  Met: Yes  Target date: at discharge  As evidenced by: Patient will participate within aftercare plan AEB aftercare provider and housing plan at discharge being identified.  12/03/15: CSW assessing for appropriate referrals.   1/6: Pt wants to return home and follow-up outpatient; possibly CDIOP. CSW assessing.   1/10: Pt plans to go to [facility] in [place], [place].   2. Goal (s): Patient  will exhibit decreased depressive symptoms and suicidal ideations.  Met: Yes   Target date: at discharge  As evidenced by: Patient will utilize self rating of depression at 3 or below and demonstrate decreased signs of depression or be deemed stable for discharge by MD.  12/02/14: Pt rates  depression as high. Denies SI/HI/AVH today.   1/6: PT rates depression as 7/10 and presents with depressed mood/flat affect.   1/10: Pt rates depression as 3/10 and presents with pleasant mood/lethargic affect. Denies SI/HI/AVH.   3. Goal(s): Patient will demonstrate decreased signs and symptoms of anxiety.  Met:Yes   Target date: at discharge  As evidenced by: Patient will utilize self rating of anxiety at 3 or below and demonstrated decreased signs of anxiety, or be deemed stable for discharge by MD  12/03/15: Pt rates anxiety as high today.   12/06/15: Pt rates anxiety as 7/10.   1/10: Pt rates anxiety as 2/10 and presents with pleasant mood/lethargic affect.   4. Goal(s): Patient will demonstrate decreased signs of withdrawal due to substance abuse  Met: Yes  Target date:at discharge   As evidenced by: Patient will produce a CIWA/COWS score of 0, have stable vitals signs, and no symptoms of withdrawal.  12/03/15: Pt reports no signs of withdrawal with high sitting BP. No CIWA score.   1/6: Pt reports mild withdrawal symptoms with CIWA of 4 and high BP.  1/10: Pt reports no signs of withdrawal with CIWA of 0. High pulse today. Per MD, pt is medically stable for d/c.   Attendees: Patient:   12/10/2015 9:37 AM   Family:   12/10/2015 9:37 AM   Physician:  Dr. Carlton Adam, MD 12/10/2015 9:37 AM   Nursing:   Rayann Heman, RN  12/10/2015 9:37 AM   Clinical Social Worker: Maxie Better, LCSW 12/10/2015 9:37 AM   Clinical Social Worker:Lauren Eugenie Filler Drinkard LCSWA 12/10/2015 9:37 AM   Other:  Gerline Legacy Nurse Case Manager 12/10/2015 9:37 AM   Other:   12/10/2015 9:37 AM   Other:   12/10/2015 9:37 AM   Other:  12/10/2015 9:37 AM   Other:  12/10/2015 9:37 AM   Other:  12/10/2015 9:37 AM    12/10/2015 9:37 AM    12/10/2015 9:37 AM    12/10/2015 9:37 AM    12/10/2015 9:37 AM    Scribe for Treatment Team:   Maxie Better, LCSW 12/10/2015 9:37 AM

## 2015-12-10 NOTE — [facility] Suicide Risk Assessment (Signed)
[facility]   Demographic Factors:  Adolescent or young adult and Caucasian  Total Time spent with patient: 20 minutes  Musculoskeletal: Strength & Muscle Tone: within normal limits Gait & Station: normal Patient leans: normal  Psychiatric Specialty Exam: Physical Exam  Review of Systems  Constitutional: Negative.   HENT: Negative.   Eyes: Negative.   Respiratory: Negative.   Cardiovascular: Negative.   Gastrointestinal: Negative.   Genitourinary: Negative.   Musculoskeletal: Negative.   Skin: Negative.   Endo/Heme/Allergies: Negative.   Psychiatric/Behavioral: Positive for depression. The patient is nervous/anxious.     Blood pressure 138/83, pulse 119, temperature 98.6 F (37 C), temperature source Oral, resp. rate 16, height 5' (1.524 m), weight 42.638 kg (94 lb), SpO2 99 %.Body mass index is 18.36 kg/(m^2).  General Appearance: Fairly Groomed  Patent attorneyye Contact::  Minimal  Speech:  Clear and Coherent409  Volume:  Normal  Mood:  Anxious and worried  Affect:  Appropriate  Thought Process:  Coherent and Goal Directed  Orientation:  Full (Time, Place, and Person)  Thought Content:  plans as she moves on, relapse prevention plan  Suicidal Thoughts:  No  Homicidal Thoughts:  No  Memory:  Immediate;   Fair Recent;   Fair Remote;   Fair  Judgement:  Fair  Insight:  Present and Shallow  Psychomotor Activity:  Restlessness  Concentration:  Fair  Recall:  [employer]Fair  Fund of Knowledge:Fair  Language: Fair  Akathisia:  No  Handed:  Right  AIMS (if indicated):     Assets:  Desire for Improvement  Sleep:  Number of Hours: 6.75  Cognition: WNL  ADL's:  Intact   Have you used any form of tobacco in the last 30 days? (Cigarettes, Smokeless Tobacco, Cigars, and/or Pipes): Yes  Has this patient used any form of tobacco in the last 30 days? (Cigarettes, Smokeless Tobacco, Cigars, and/or Pipes) Yes, A prescription for an FDA-approved tobacco cessation  medication was offered at discharge and the patient refused  Mental Status Per Nursing Assessment::   On Admission:  NA  Current Mental Status by Physician: In full contact with reality. There are no active S/S of withdrawal. There are no active SI plans or intent. She is willing and motivated to go to [place] residential treatment [place]. States this is going to give her more time to get stronger and continue to address the stressful things in her life without having to use drugs or alcohol   Loss Factors: NA  Historical Factors: NA  Risk Reduction Factors:   Sense of responsibility to family and Positive social support  Continued Clinical Symptoms:  Depression:   Comorbid alcohol abuse/dependence Alcohol/Substance Abuse/Dependencies  Cognitive Features That Contribute To Risk:  Closed-mindedness, Polarized thinking and Thought constriction (tunnel vision)    Suicide Risk:  Minimal: No identifiable suicidal ideation.  Patients presenting with no risk factors but with morbid ruminations; may be classified as minimal risk based on the severity of the depressive symptoms  Principal Problem: Severe recurrent major depression without psychotic features [facility](HCC) Discharge Diagnoses:  Patient Active Problem List   Diagnosis Date Noted  . Benzodiazepine abuse [F13.10] 12/03/2015  . Alcohol abuse [F10.10] 12/03/2015  . Severe recurrent major depression without psychotic features (HCC) [F33.2] 12/03/2015  . PTSD (post-traumatic stress disorder) [F43.10] 12/03/2015  . 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   Why:  Alex at facility  is working on getting plane ticket for this evening. You have been accepted to this facility at discharge. Please contact Alex regarding transportation arrangements immediately at discharge [phone]).    Contact information:   [address] [place], [place] [address] Phone: [phone] Fax: [phone]      Plan Of Care/Follow-up recommendations:  Activity:  as tolerated Diet:  regular Follow up as above Is patient on multiple antipsychotic therapies at discharge:  No   Has Patient had three or more failed trials of antipsychotic monotherapy by history:  No  Recommended Plan for Multiple Antipsychotic Therapies: NA    LUGO,IRVING A 12/10/2015, 1:10 PM

## 2015-12-10 NOTE — [facility] Suicide Risk Assessment (Signed)
[facility] INPATIENT:  Family/Significant Other Suicide Prevention Education  Suicide Prevention Education:  Patient Refusal for Family/Significant Other Suicide Prevention Education: The patient Veronica Moore has refused to provide written consent for family/significant other to be provided Family/Significant Other Suicide Prevention Education during admission and/or prior to discharge.  Physician notified.  SPE completed with pt, as pt refused to consent to family contact. SPI pamphlet provided to pt and pt was encouraged to share information with support network, ask questions, and talk about any concerns relating to SPE. Pt denies access to guns/firearms and verbalized understanding of information provided. Mobile Crisis information also provided to pt.   Smart, Heather LCSW 12/10/2015, 9:31 AM

## 2015-12-10 NOTE — Plan of Care (Signed)
Problem: Diagnosis: Increased Risk For Suicide Attempt Goal: STG-Patient Will Attend All Groups On The Unit Outcome: Progressing Pt attended evening group on 12/09/15

## 2015-12-10 NOTE — Progress Notes (Signed)
  [facility] :  Will you be returning to the same living situation after discharge:  No.  [facility]  At discharge, do you have transportation home?: Yes,  parent. Facility is working to get plane ticket for pt this evening.  Do you have the ability to pay for your medications: Yes,  BCBS private insurance.   Release of information consent forms completed and submitted to medical records by CSW.   Patient to Follow up at: Follow-up Information    [facility]   Why:  Alex at facility is working on Chiropractorgetting plane ticket for this evening. You have been accepted to this facility at discharge. Please contact Alex regarding transportation arrangements immediately at discharge [phone]).    Contact information:   [address] [place], [place] [id number] Phone: [phone] Fax: [phone]      Next level of care provider has [facility] to [facility] Link:no  Safety Planning and Suicide Prevention discussed: Yes,  SPE completed with pt, as she refused to consent to family contact.   Have you used any form of tobacco in the last 30 days? (Cigarettes, Smokeless Tobacco, Cigars, and/or Pipes): Yes  Has patient been referred to the Quitline?: Patient refused referral  Patient has been referred for addiction treatment: Yes- see above.   Smart, Heather LCSW 12/10/2015, 9:32 AM
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
## 2015-12-10 NOTE — Progress Notes (Signed)
D: Pt has anxious affect and mood.  She reports her day was "good until my dad came."  Pt reports she is discharging soon.  She reports her goal was to "be 100 pounds by the day I leave and I'm 97 now, almost there."  Pt denies SI/HI, denies hallucinations, denies pain.  Pt has been visible in milieu interacting with peers and staff appropriately.  Pt attended evening group.  Pt was on the phone frequently earlier tonight.  She eventually told staff that she did not wish to receive further phone calls tonight.   A: Met with pt 1:1 and provided support and encouragement.  Actively listened to pt.  Medications administered per order.  PRN medication administered for anxiety and sleep. R: Pt is compliant with medications.  Pt verbally contracts for safety.  Will continue to monitor and assess.

## 2015-12-10 NOTE — Progress Notes (Signed)
Patient ID: Veronica Moore, female   DOB: 01/03/1996, 20 y.o.   MRN: [id number] NSG D/C note:Pt denies si/hi at this time. States that she will comply with follow-up arrangements in [place] and take her meds as prescribed. D/C to lobby with step-parent providing transport to airport for flight to [place].

## 2016-08-07 ENCOUNTER — Encounter: Payer: Self-pay | Admitting: Family Medicine

## 2016-08-07 ENCOUNTER — Ambulatory Visit (INDEPENDENT_AMBULATORY_CARE_PROVIDER_SITE_OTHER): Payer: BC Managed Care – PPO | Admitting: Family Medicine

## 2016-08-07 VITALS — BP 90/58 | HR 97 | Temp 98.5°F | Ht 60.5 in | Wt 110.6 lb

## 2016-08-07 DIAGNOSIS — Z23 Encounter for immunization: Secondary | ICD-10-CM | POA: Diagnosis not present

## 2016-08-07 DIAGNOSIS — Z202 Contact with and (suspected) exposure to infections with a predominantly sexual mode of transmission: Secondary | ICD-10-CM | POA: Diagnosis not present

## 2016-08-07 DIAGNOSIS — Z3201 Encounter for pregnancy test, result positive: Secondary | ICD-10-CM | POA: Diagnosis not present

## 2016-08-07 DIAGNOSIS — Z3200 Encounter for pregnancy test, result unknown: Secondary | ICD-10-CM

## 2016-08-07 LAB — POCT URINE PREGNANCY: Preg Test, Ur: POSITIVE — AB

## 2016-08-07 NOTE — Patient Instructions (Signed)
Thank you for coming in today, it was so nice to see you! Today we talked about:    Pregnancy: please schedule an appointment to be seen for an initial OB visit.   Please take a prenatal vitamin daily  You can schedule this appointment at the front desk before you leave or call the clinic.  If we ordered any tests today, you will be notified via telephone of any abnormalities. If everything is normal you will get a letter in the mail.   If you have any questions or concerns, please do not hesitate to call the office at [phone]. You can also message me directly via MyChart.   Sincerely,  Anders Simmonds, MD  Prenatal Care WHAT IS PRENATAL CARE?  Prenatal care is the process of caring for a pregnant woman before she gives birth. Prenatal care makes sure that she and her baby remain as healthy as possible throughout pregnancy. Prenatal care may be provided by a midwife, family practice health care provider, or a childbirth and pregnancy specialist (obstetrician). Prenatal care may include physical examinations, testing, treatments, and education on nutrition, lifestyle, and social support services. WHY IS PRENATAL CARE SO IMPORTANT?  Early and consistent prenatal care increases the chance that you and your baby will remain healthy throughout your pregnancy. This type of care also decreases a baby's risk of being born too early (prematurely), or being born smaller than expected (small for gestational age). Any underlying medical conditions you may have that could pose a risk during your pregnancy are discussed during prenatal care visits. You will also be monitored regularly for any new conditions that may arise during your pregnancy so they can be treated quickly and effectively. WHAT HAPPENS DURING PRENATAL CARE VISITS? Prenatal care visits may include the following: Discussion Tell your health care provider about any new signs or symptoms you have experienced since your last visit.  These might include:  Nausea or vomiting.  Increased or decreased level of energy.  Difficulty sleeping.  Back or leg pain.  Weight changes.  Frequent urination.  Shortness of breath with physical activity.  Changes in your skin, such as the development of a rash or itchiness.  Vaginal discharge or bleeding.  Feelings of excitement or nervousness.  Changes in your baby's movements. You may want to write down any questions or topics you want to discuss with your health care provider and bring them with you to your appointment. Examination During your first prenatal care visit, you will likely have a complete physical exam. Your health care provider will often examine your vagina, cervix, and the position of your uterus, as well as check your heart, lungs, and other body systems. As your pregnancy progresses, your health care provider will measure the size of your uterus and your baby's position inside your uterus. He or she may also examine you for early signs of labor. Your prenatal visits may also include checking your blood pressure and, after about 10-12 weeks of pregnancy, listening to your baby's heartbeat. Testing Regular testing often includes:  Urinalysis. This checks your urine for glucose, protein, or signs of infection.  Blood count. This checks the levels of white and red blood cells in your body.  Tests for sexually transmitted infections (STIs). Testing for STIs at the beginning of pregnancy is routinely done and is required in many states.  Antibody testing. You will be checked to see if you are immune to certain illnesses, such as rubella, that can affect a developing fetus.  Glucose screen. Around 24-28 weeks of pregnancy, your blood glucose level will be checked for signs of gestational diabetes. Follow-up tests may be recommended.  Group B strep. This is a bacteria that is commonly found inside a woman's vagina. This test will inform your health care provider  if you need an antibiotic to reduce the amount of this bacteria in your body prior to labor and childbirth.  Ultrasound. Many pregnant women undergo an ultrasound screening around 18-20 weeks of pregnancy to evaluate the health of the fetus and check for any developmental abnormalities.  HIV (human immunodeficiency virus) testing. Early in your pregnancy, you will be screened for HIV. If you are at high risk for HIV, this test may be repeated during your third trimester of pregnancy. You may be offered other testing based on your age, personal or family medical history, or other factors.  HOW OFTEN SHOULD I PLAN TO SEE MY HEALTH CARE PROVIDER FOR PRENATAL CARE? Your prenatal care check-up schedule depends on any medical conditions you have before, or develop during, your pregnancy. If you do not have any underlying medical conditions, you will likely be seen for checkups:  Monthly, during the first 6 months of pregnancy.  Twice a month during months 7 and 8 of pregnancy.  Weekly starting in the 9th month of pregnancy and until delivery. If you develop signs of early labor or other concerning signs or symptoms, you may need to see your health care provider more often. Ask your health care provider what prenatal care schedule is best for you. WHAT CAN I DO TO KEEP MYSELF AND MY BABY AS HEALTHY AS POSSIBLE DURING MY PREGNANCY?  Take a prenatal vitamin containing 400 micrograms (0.4 mg) of folic acid every day. Your health care provider may also ask you to take additional vitamins such as iodine, vitamin D, iron, copper, and zinc.  Take 1500-2000 mg of calcium daily starting at your 20th week of pregnancy until you deliver your baby.  Make sure you are up to date on your vaccinations. Unless directed otherwise by your health care provider:  You should receive a tetanus, diphtheria, and pertussis (Tdap) vaccination between the 27th and 36th week of your pregnancy, regardless of when your last Tdap  immunization occurred. This helps protect your baby from whooping cough (pertussis) after he or she is born.  You should receive an annual inactivated influenza vaccine (IIV) to help protect you and your baby from influenza. This can be done at any point during your pregnancy.  Eat a well-rounded diet that includes:  Fresh fruits and vegetables.  Lean proteins.  Calcium-rich foods such as milk, yogurt, hard cheeses, and dark, leafy greens.  Whole grain breads.  Do noteat seafood high in mercury, including:  Swordfish.  Tilefish.  Shark.  King mackerel.  More than 6 oz tuna per week.  Do not eat:  Raw or undercooked meats or eggs.  Unpasteurized foods, such as soft cheeses (brie, blue, or feta), juices, and milks.  Lunch meats.  Hot dogs that have not been heated until they are steaming.  Drink enough water to keep your urine clear or pale yellow. For many women, this may be 10 or more 8 oz glasses of water each day. Keeping yourself hydrated helps deliver nutrients to your baby and may prevent the start of pre-term uterine contractions.  Do not use any tobacco products including cigarettes, chewing tobacco, or electronic cigarettes. If you need help quitting, ask your health care provider.  Do  not drink beverages containing alcohol. No safe level of alcohol consumption during pregnancy has been determined.  Do not use any illegal drugs. These can harm your developing baby or cause a miscarriage.  Ask your health care provider or pharmacist before taking any prescription or over-the-counter medicines, herbs, or supplements.  Limit your caffeine intake to no more than 200 mg per day.  Exercise. Unless told otherwise by your health care provider, try to get 30 minutes of moderate exercise most days of the week. Do not  do high-impact activities, contact sports, or activities with a high risk of falling, such as horseback riding or downhill skiing.  Get plenty of  rest.  Avoid anything that raises your body temperature, such as hot tubs and saunas.  If you own a cat, do not empty its litter box. Bacteria contained in cat feces can cause an infection called toxoplasmosis. This can result in serious harm to the fetus.  Stay away from chemicals such as insecticides, lead, mercury, and cleaning or paint products that contain solvents.  Do not have any X-rays taken unless medically necessary.  Take a childbirth and breastfeeding preparation class. Ask your health care provider if you need a referral or recommendation.   This information is not intended to replace advice given to you by your health care provider. Make sure you discuss any questions you have with your health care provider.   Document Released: 11/19/2003 Document Revised: 12/07/2014 Document Reviewed: 01/31/2014 Elsevier Interactive Patient Education [employer]

## 2016-08-07 NOTE — Progress Notes (Signed)
   Subjective:    Patient ID: Veronica Moore , female   DOB: 08/20/1996 , 20 y.o..   MRN: [id number]  HPI  Veronica Moore is here for concerns for pregnancy. Patient is here today with concerns for pregnancy Patient took a home pregnancy test which was positive a week ago. Her last special period was July 15 but then she had some spotting on August 5. The pregnancy is unplanned but welcomed. She has not been on any birth control in did not consistently use condoms. She admits to some breast tenderness and soreness but denies any pelvic pain and cramping or vaginal bleeding. Her periods are typically every month but can occur at a different day. She is concerned because she found out that a previous partner that she had a while ago has HIV, her current partner does not have any STDs. Of note patient has a significant psychiatric past medical history; ADHD, severe recurrent major depression, and PTSD. Also of note patient has been admitted to rehabilitation before for opioid abuse. Patient states that since January she has stopped all of her psychiatric medications and she feels great. She denies any depression, anxiety, suicidal or homicidal ideations. She has been taking vitamin supplements of 5 HTP with B6 and L tyrosine.   Review of Systems: Per HPI. All other systems reviewed and are negative.  Past Medical History: Patient Active Problem List   Diagnosis Date Noted  . Positive pregnancy test 08/11/2016  . Severe recurrent major depression without psychotic features (HCC) 12/03/2015  . PTSD (post-traumatic stress disorder) 12/03/2015  . ATTENTION DEFICIT DISORDER, INATTENTIVE TYPE 11/26/2009    Medications:None  Social Hx: Does not smoke cigarettes anymore   Objective:   BP (!) 90/58   Pulse 97   Temp 98.5 F (36.9 C) (Oral)   Ht 5' 0.5" (1.537 m)   Wt 110 lb 9.6 oz (50.2 kg)   LMP 06/13/2016 (Exact Date)   SpO2 99%   BMI 21.24 kg/m  Physical Exam  Gen: NAD, alert,  cooperative with exam, well-appearing HEENT: NCAT, PERRL, clear conjunctiva Cardiac: Regular rate and rhythm Respiratory: non-labored breathing Neurological: no gross deficits.  Psych: good insight, normal mood and affect   Assessment & Plan:  Positive pregnancy test Positive pregnancy test today. Based on LMP patient is about [redacted] weeks pregnant. Denies any vaginal bleeding, discharge, or pelvic cramping at this time. Pregnancy was unexpected but welcomed. Patient has good family support, her identical twin was here with her at the visit today. She has a significant psychiatric history and was recently admitted to inpatient psych for suicidal ideation in January 2017. She has not been on any psychiatric medications since January and she is doing very well. Additionally, is not smoking cigarettes, doing drugs, or using alcohol. - Patient will schedule first OB visit for next week - Discussed daily prenatal vitamins - Will order HIV test today as patient is concerned because one of her former partners tested positive. Otherwise will do other prenatal labs in the next upcoming week before her first OB appointment.    Anders Simmondshristina Gambino, MD [facility], PGY-2

## 2016-08-08 LAB — HIV ANTIBODY (ROUTINE TESTING W REFLEX): HIV 1&2 Ab, 4th Generation: NONREACTIVE

## 2016-08-10 ENCOUNTER — Other Ambulatory Visit (INDEPENDENT_AMBULATORY_CARE_PROVIDER_SITE_OTHER): Payer: Medicaid Other

## 2016-08-10 DIAGNOSIS — Z3481 Encounter for supervision of other normal pregnancy, first trimester: Secondary | ICD-10-CM

## 2016-08-10 LAB — POCT URINALYSIS DIPSTICK
Bilirubin, UA: NEGATIVE
Blood, UA: NEGATIVE
Glucose, UA: NEGATIVE
Ketones, UA: NEGATIVE
Leukocytes, UA: NEGATIVE
Nitrite, UA: NEGATIVE
Protein, UA: NEGATIVE
Spec Grav, UA: >=1.03
Urobilinogen, UA: 0.2
pH, UA: 5.5

## 2016-08-10 NOTE — Progress Notes (Signed)
URI.

## 2016-08-11 ENCOUNTER — Encounter: Payer: Self-pay | Admitting: Family Medicine

## 2016-08-11 DIAGNOSIS — Z3201 Encounter for pregnancy test, result positive: Secondary | ICD-10-CM | POA: Insufficient documentation

## 2016-08-11 LAB — OBSTETRIC PANEL
Antibody Screen: NEGATIVE
Basophils Absolute: 0 cells/uL (ref 0–200)
Basophils Relative: 0 %
Eosinophils Absolute: 104 cells/uL (ref 15–500)
Eosinophils Relative: 2 %
HCT: 35 % (ref 35.0–45.0)
Hemoglobin: 11.8 g/dL (ref 11.7–15.5)
Hepatitis B Surface Ag: NEGATIVE
Lymphocytes Relative: 36 %
Lymphs Abs: 1872 cells/uL (ref 850–3900)
MCH: 29.4 pg (ref 27.0–33.0)
MCHC: 33.7 g/dL (ref 32.0–36.0)
MCV: 87.1 fL (ref 80.0–100.0)
MPV: 10.1 fL (ref 7.5–12.5)
Monocytes Absolute: 520 cells/uL (ref 200–950)
Monocytes Relative: 10 %
Neutro Abs: 2704 cells/uL (ref 1500–7800)
Neutrophils Relative %: 52 %
Platelets: 243 10*3/uL (ref 140–400)
RBC: 4.02 MIL/uL (ref 3.80–5.10)
RDW: 13.5 % (ref 11.0–15.0)
Rh Type: POSITIVE
Rubella: 20.9 Index — ABNORMAL HIGH (ref <0.90)
WBC: 5.2 10*3/uL (ref 3.8–10.8)

## 2016-08-11 LAB — SICKLE CELL SCREEN: Sickle Cell Screen: NEGATIVE

## 2016-08-11 NOTE — Assessment & Plan Note (Addendum)
Positive pregnancy test today. Based on LMP patient is about [redacted] weeks pregnant. Denies any vaginal bleeding, discharge, or pelvic cramping at this time. Pregnancy was unexpected but welcomed. Patient has good family support, her identical twin was here with her at the visit today. She has a significant psychiatric history and was recently admitted to inpatient psych for suicidal ideation in January 2017. She has not been on any psychiatric medications since January and she is doing very well. Additionally, is not smoking cigarettes, doing drugs, or using alcohol. - Patient will schedule first OB visit for next week - Discussed daily prenatal vitamins - Will order HIV test today as patient is concerned because one of her former partners tested positive. Otherwise will do other prenatal labs in the next upcoming week before her first OB appointment.

## 2016-08-12 ENCOUNTER — Encounter: Payer: Self-pay | Admitting: Family Medicine

## 2016-08-12 LAB — CULTURE, OB URINE

## 2016-08-13 ENCOUNTER — Encounter: Payer: Self-pay | Admitting: Internal Medicine

## 2016-08-13 ENCOUNTER — Other Ambulatory Visit: Payer: Self-pay

## 2016-08-13 DIAGNOSIS — Z3493 Encounter for supervision of normal pregnancy, unspecified, third trimester: Secondary | ICD-10-CM | POA: Insufficient documentation
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
## 2016-08-20 ENCOUNTER — Other Ambulatory Visit (HOSPITAL_COMMUNITY)
Admission: RE | Admit: 2016-08-20 | Discharge: 2016-08-20 | Disposition: A | Discharge status: Home / Self Care | Payer: BC Managed Care – PPO | Source: Ambulatory Visit | Attending: Family Medicine | Admitting: Family Medicine

## 2016-08-20 ENCOUNTER — Encounter: Payer: Self-pay | Admitting: Internal Medicine

## 2016-08-20 ENCOUNTER — Ambulatory Visit (INDEPENDENT_AMBULATORY_CARE_PROVIDER_SITE_OTHER): Payer: BC Managed Care – PPO | Admitting: Internal Medicine

## 2016-08-20 VITALS — BP 113/61 | HR 89 | Temp 98.3°F | Wt 109.0 lb

## 2016-08-20 DIAGNOSIS — Z331 Pregnant state, incidental: Secondary | ICD-10-CM

## 2016-08-20 DIAGNOSIS — Z3490 Encounter for supervision of normal pregnancy, unspecified, unspecified trimester: Secondary | ICD-10-CM

## 2016-08-20 DIAGNOSIS — Z01419 Encounter for gynecological examination (general) (routine) without abnormal findings: Secondary | ICD-10-CM | POA: Insufficient documentation

## 2016-08-20 DIAGNOSIS — Z1159 Encounter for screening for other viral diseases: Secondary | ICD-10-CM | POA: Diagnosis not present

## 2016-08-20 DIAGNOSIS — Z124 Encounter for screening for malignant neoplasm of cervix: Secondary | ICD-10-CM

## 2016-08-20 DIAGNOSIS — Z87898 Personal history of other specified conditions: Secondary | ICD-10-CM

## 2016-08-20 DIAGNOSIS — F1991 Other psychoactive substance use, unspecified, in remission: Secondary | ICD-10-CM

## 2016-08-20 DIAGNOSIS — Z113 Encounter for screening for infections with a predominantly sexual mode of transmission: Secondary | ICD-10-CM | POA: Insufficient documentation

## 2016-08-20 DIAGNOSIS — F319 Bipolar disorder, unspecified: Secondary | ICD-10-CM | POA: Insufficient documentation

## 2016-08-20 DIAGNOSIS — A6000 Herpesviral infection of urogenital system, unspecified: Secondary | ICD-10-CM

## 2016-08-20 DIAGNOSIS — Z3481 Encounter for supervision of other normal pregnancy, first trimester: Secondary | ICD-10-CM

## 2016-08-20 DIAGNOSIS — F1921 Other psychoactive substance dependence, in remission: Secondary | ICD-10-CM

## 2016-08-20 LAB — OB RESULTS CONSOLE GC/CHLAMYDIA: Gonorrhea: NEGATIVE

## 2016-08-20 NOTE — Assessment & Plan Note (Signed)
Denies current drug use. HCV today for screening.

## 2016-08-20 NOTE — Patient Instructions (Addendum)
Thank you for coming in. Please make a follow up OB visit in 4 weeks. Make sure you go to your ultrasound visits  Please call Dr. Pascal LuxKane to get set up at [facility].   [facility]: provides you with a nurse mentor through out your pregnancy who can make home visits. Additionally the program follows your family for 3 years and your child will be eligible for free child care through [employer]. Phone [phone].   [employer] has great programs form healthy pregnancy. Please call [phone].       First Trimester of Pregnancy The first trimester of pregnancy is from week 1 until the end of week 12 (months 1 through 3). A week after a sperm fertilizes an egg, the egg will implant on the wall of the uterus. This embryo will begin to develop into a baby. Genes from you and your partner are forming the baby. The female genes determine whether the baby is a boy or a girl. At 6-8 weeks, the eyes and face are formed, and the heartbeat can be seen on ultrasound. At the end of 12 weeks, all the baby's organs are formed.  Now that you are pregnant, you will want to do everything you can to have a healthy baby. Two of the most important things are to get good prenatal care and to follow your health care provider's instructions. Prenatal care is all the medical care you receive before the baby's birth. This care will help prevent, find, and treat any problems during the pregnancy and childbirth. BODY CHANGES Your body goes through many changes during pregnancy. The changes vary from woman to woman.   You may gain or lose a couple of pounds at first.  You may feel sick to your stomach (nauseous) and throw up (vomit). If the vomiting is uncontrollable, call your health care provider.  You may tire easily.  You may develop headaches that can be relieved by medicines approved by your health care provider.  You may urinate more often. Painful  urination may mean you have a bladder infection.  You may develop heartburn as a result of your pregnancy.  You may develop constipation because certain hormones are causing the muscles that push waste through your intestines to slow down.  You may develop hemorrhoids or swollen, bulging veins (varicose veins).  Your breasts may begin to grow larger and become tender. Your nipples may stick out more, and the tissue that surrounds them (areola) may become darker.  Your gums may bleed and may be sensitive to brushing and flossing.  Dark spots or blotches (chloasma, mask of pregnancy) may develop on your face. This will likely fade after the baby is born.  Your menstrual periods will stop.  You may have a loss of appetite.  You may develop cravings for certain kinds of food.  You may have changes in your emotions from day to day, such as being excited to be pregnant or being concerned that something may go wrong with the pregnancy and baby.  You may have more vivid and strange dreams.  You may have changes in your hair. These can include thickening of your hair, rapid growth, and changes in texture. Some women also have hair loss during or after pregnancy, or hair that feels dry or thin. Your hair will most likely return to normal after your baby is born. WHAT TO EXPECT AT YOUR PRENATAL VISITS During  a routine prenatal visit:  You will be weighed to make sure you and the baby are growing normally.  Your blood pressure will be taken.  Your abdomen will be measured to track your baby's growth.  The fetal heartbeat will be listened to starting around week 10 or 12 of your pregnancy.  Test results from any previous visits will be discussed. Your health care provider may ask you:  How you are feeling.  If you are feeling the baby move.  If you have had any abnormal symptoms, such as leaking fluid, bleeding, severe headaches, or abdominal cramping.  If you are using any tobacco  products, including cigarettes, chewing tobacco, and electronic cigarettes.  If you have any questions. Other tests that may be performed during your first trimester include:  Blood tests to find your blood type and to check for the presence of any previous infections. They will also be used to check for low iron levels (anemia) and Rh antibodies. Later in the pregnancy, blood tests for diabetes will be done along with other tests if problems develop.  Urine tests to check for infections, diabetes, or protein in the urine.  An ultrasound to confirm the proper growth and development of the baby.  An amniocentesis to check for possible genetic problems.  Fetal screens for spina bifida and Down syndrome.  You may need other tests to make sure you and the baby are doing well.  HIV (human immunodeficiency virus) testing. Routine prenatal testing includes screening for HIV, unless you choose not to have this test. HOME CARE INSTRUCTIONS  Medicines  Follow your health care provider's instructions regarding medicine use. Specific medicines may be either safe or unsafe to take during pregnancy.  Take your prenatal vitamins as directed.  If you develop constipation, try taking a stool softener if your health care provider approves. Diet  Eat regular, well-balanced meals. Choose a variety of foods, such as meat or vegetable-based protein, fish, milk and low-fat dairy products, vegetables, fruits, and whole grain breads and cereals. Your health care provider will help you determine the amount of weight gain that is right for you.  Avoid raw meat and uncooked cheese. These carry germs that can cause birth defects in the baby.  Eating four or five small meals rather than three large meals a day may help relieve nausea and vomiting. If you start to feel nauseous, eating a few soda crackers can be helpful. Drinking liquids between meals instead of during meals also seems to help nausea and  vomiting.  If you develop constipation, eat more high-fiber foods, such as fresh vegetables or fruit and whole grains. Drink enough fluids to keep your urine clear or pale yellow. Activity and Exercise  Exercise only as directed by your health care provider. Exercising will help you:  Control your weight.  Stay in shape.  Be prepared for labor and delivery.  Experiencing pain or cramping in the lower abdomen or low back is a good sign that you should stop exercising. Check with your health care provider before continuing normal exercises.  Try to avoid standing for long periods of time. Move your legs often if you must stand in one place for a long time.  Avoid heavy lifting.  Wear low-heeled shoes, and practice good posture.  You may continue to have sex unless your health care provider directs you otherwise. Relief of Pain or Discomfort  Wear a good support bra for breast tenderness.   Take warm sitz baths to soothe  any pain or discomfort caused by hemorrhoids. Use hemorrhoid cream if your health care provider approves.   Rest with your legs elevated if you have leg cramps or low back pain.  If you develop varicose veins in your legs, wear support hose. Elevate your feet for 15 minutes, 3-4 times a day. Limit salt in your diet. Prenatal Care  Schedule your prenatal visits by the twelfth week of pregnancy. They are usually scheduled monthly at first, then more often in the last 2 months before delivery.  Write down your questions. Take them to your prenatal visits.  Keep all your prenatal visits as directed by your health care provider. Safety  Wear your seat belt at all times when driving.  Make a list of emergency phone numbers, including numbers for family, friends, the hospital, and police and fire departments. General Tips  Ask your health care provider for a referral to a local prenatal education class. Begin classes no later than at the beginning of month 6 of  your pregnancy.  Ask for help if you have counseling or nutritional needs during pregnancy. Your health care provider can offer advice or refer you to specialists for help with various needs.  Do not use hot tubs, steam rooms, or saunas.  Do not douche or use tampons or scented sanitary pads.  Do not cross your legs for long periods of time.  Avoid cat litter boxes and soil used by cats. These carry germs that can cause birth defects in the baby and possibly loss of the fetus by miscarriage or stillbirth.  Avoid all smoking, herbs, alcohol, and medicines not prescribed by your health care provider. Chemicals in these affect the formation and growth of the baby.  Do not use any tobacco products, including cigarettes, chewing tobacco, and electronic cigarettes. If you need help quitting, ask your health care provider. You may receive counseling support and other resources to help you quit.  Schedule a dentist appointment. At home, brush your teeth with a soft toothbrush and be gentle when you floss. SEEK MEDICAL CARE IF:   You have dizziness.  You have mild pelvic cramps, pelvic pressure, or nagging pain in the abdominal area.  You have persistent nausea, vomiting, or diarrhea.  You have a bad smelling vaginal discharge.  You have pain with urination.  You notice increased swelling in your face, hands, legs, or ankles. SEEK IMMEDIATE MEDICAL CARE IF:   You have a fever.  You are leaking fluid from your vagina.  You have spotting or bleeding from your vagina.  You have severe abdominal cramping or pain.  You have rapid weight gain or loss.  You vomit blood or material that looks like coffee grounds.  You are exposed to [place] measles and have never had them.  You are exposed to fifth disease or chickenpox.  You develop a severe headache.  You have shortness of breath.  You have any kind of trauma, such as from a fall or a car accident.   This information is not  intended to replace advice given to you by your health care provider. Make sure you discuss any questions you have with your health care provider.   Document Released: 11/10/2001 Document Revised: 12/07/2014 Document Reviewed: 09/26/2013 Elsevier Interactive Patient Education [employer]

## 2016-08-21 LAB — CERVICOVAGINAL ANCILLARY ONLY
Chlamydia: NEGATIVE
Neisseria Gonorrhea: NEGATIVE

## 2016-08-21 LAB — HEPATITIS C ANTIBODY: HCV Ab: NEGATIVE

## 2016-08-24 LAB — CYTOLOGY - PAP

## 2016-09-02 ENCOUNTER — Ambulatory Visit (HOSPITAL_COMMUNITY)
Admission: RE | Admit: 2016-09-02 | Discharge: 2016-09-02 | Disposition: A | Discharge status: Home / Self Care | Payer: BC Managed Care – PPO | Source: Ambulatory Visit | Attending: Family Medicine | Admitting: Family Medicine

## 2016-09-02 DIAGNOSIS — O283 Abnormal ultrasonic finding on antenatal screening of mother: Secondary | ICD-10-CM | POA: Insufficient documentation

## 2016-09-02 DIAGNOSIS — Z3A12 12 weeks gestation of pregnancy: Secondary | ICD-10-CM | POA: Diagnosis not present

## 2016-09-02 DIAGNOSIS — Z3490 Encounter for supervision of normal pregnancy, unspecified, unspecified trimester: Secondary | ICD-10-CM

## 2016-09-03 ENCOUNTER — Encounter (HOSPITAL_COMMUNITY): Payer: Self-pay | Admitting: Family Medicine

## 2016-09-09 ENCOUNTER — Other Ambulatory Visit: Payer: Self-pay | Admitting: Internal Medicine

## 2016-09-09 ENCOUNTER — Encounter (HOSPITAL_COMMUNITY): Payer: Self-pay

## 2016-09-09 ENCOUNTER — Ambulatory Visit (HOSPITAL_COMMUNITY)
Admission: RE | Admit: 2016-09-09 | Discharge: 2016-09-09 | Disposition: A | Discharge status: Home / Self Care | Payer: BC Managed Care – PPO | Source: Ambulatory Visit | Attending: Family Medicine | Admitting: Family Medicine

## 2016-09-09 ENCOUNTER — Ambulatory Visit (HOSPITAL_COMMUNITY): Admission: RE | Admit: 2016-09-09 | Payer: BC Managed Care – PPO | Source: Ambulatory Visit

## 2016-09-09 VITALS — BP 116/65 | HR 87 | Wt 115.6 lb

## 2016-09-09 DIAGNOSIS — Z3490 Encounter for supervision of normal pregnancy, unspecified, unspecified trimester: Secondary | ICD-10-CM

## 2016-09-09 DIAGNOSIS — Z3A13 13 weeks gestation of pregnancy: Secondary | ICD-10-CM | POA: Insufficient documentation

## 2016-09-09 DIAGNOSIS — Z3682 Encounter for antenatal screening for nuchal translucency: Secondary | ICD-10-CM | POA: Insufficient documentation

## 2016-09-09 HISTORY — DX: Bipolar disorder, unspecified: F31.9

## 2016-09-11 ENCOUNTER — Encounter (HOSPITAL_COMMUNITY): Payer: Self-pay

## 2016-09-11 ENCOUNTER — Ambulatory Visit (HOSPITAL_COMMUNITY)
Admission: RE | Admit: 2016-09-11 | Discharge: 2016-09-11 | Disposition: A | Discharge status: Home / Self Care | Payer: BC Managed Care – PPO | Source: Ambulatory Visit | Attending: Family Medicine | Admitting: Family Medicine

## 2016-09-11 DIAGNOSIS — Z3A13 13 weeks gestation of pregnancy: Secondary | ICD-10-CM | POA: Insufficient documentation

## 2016-09-11 DIAGNOSIS — Z3682 Encounter for antenatal screening for nuchal translucency: Secondary | ICD-10-CM

## 2016-09-15 ENCOUNTER — Other Ambulatory Visit: Payer: Self-pay | Admitting: Family Medicine

## 2016-09-16 ENCOUNTER — Encounter: Payer: Self-pay | Admitting: Internal Medicine

## 2016-09-18 ENCOUNTER — Other Ambulatory Visit (HOSPITAL_COMMUNITY): Payer: Self-pay

## 2016-09-28 ENCOUNTER — Ambulatory Visit (INDEPENDENT_AMBULATORY_CARE_PROVIDER_SITE_OTHER): Payer: BC Managed Care – PPO | Admitting: Internal Medicine

## 2016-09-28 ENCOUNTER — Other Ambulatory Visit (HOSPITAL_COMMUNITY)
Admission: RE | Admit: 2016-09-28 | Discharge: 2016-09-28 | Disposition: A | Discharge status: Home / Self Care | Payer: BC Managed Care – PPO | Source: Ambulatory Visit | Attending: Family Medicine | Admitting: Family Medicine

## 2016-09-28 VITALS — BP 96/52 | HR 98 | Temp 98.4°F | Wt 117.8 lb

## 2016-09-28 DIAGNOSIS — O0001 Abdominal pregnancy with intrauterine pregnancy: Secondary | ICD-10-CM | POA: Diagnosis not present

## 2016-09-28 DIAGNOSIS — O4690 Antepartum hemorrhage, unspecified, unspecified trimester: Secondary | ICD-10-CM | POA: Diagnosis not present

## 2016-09-28 DIAGNOSIS — Z113 Encounter for screening for infections with a predominantly sexual mode of transmission: Secondary | ICD-10-CM | POA: Diagnosis not present

## 2016-09-28 LAB — OB RESULTS CONSOLE GBS: GBS: NEGATIVE

## 2016-09-28 NOTE — Patient Instructions (Addendum)
We ordered an ultrasound since you had some vaginal bleeding  We also ordered the anatomy ultrasound  I will follow up on your urine test and other test  [facility]

## 2016-09-28 NOTE — Progress Notes (Signed)
Veronica Moore is a 20 y.o. G2P0010 at 7319w6d for routine follow up.  She reports no complaints currently. See flow sheet for details. Reports of no concerns but then reports she had vaginal bleeding last week at the beginning of the week. 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 Then for the next few days she still had some vaginal bleeding but not as much; she had to wear a panty liner. This stopped two days ago. She denies cramping/contractions, LOF. Reports fetal movement.  No abnormal vaginal discharge. No dysuria. Is sexually active daily with one partner.  Is taking prenatal vitamin. Has psych history. Denies feeling of depression.   Objective:  GEN: NAD Neck: normal thyroid  CV: RRR, no murmurs, rubs, or gallops PULM: CTAB, normal effort ABD: Soft, nontender, gravid SKIN: No rash or cyanosis; warm and well-perfused EXTR: No lower extremity edema or calf tenderness PSYCH: Mood and affect euthymic, normal rate and volume of speech GU Exam: Female genitalia: normal external genitalia, vulva, vagina, cervix, uterus and adnexa. Cervix is closed.    A/P: Pregnancy at 10619w6d.  Doing well.  Pregnancy issues include: episode of painless vaginal bleeding which has now resolved. Will obtain Ultrasound limited to further evaluate. History of GBS in urine culture: last culture with inadequate number of colonies for treatment. Will repeat OB urine culture today. Anatomy ultrasound ordered to be scheduled at 18-19 weeks. Pt  already obtained genetic screening: negative screen, however report notes of absent nasal bone which is not accurate according to the ultrasound. Discussed with MFM attending who will addend the report Bleeding and pain precautions reviewed. [facility]

## 2016-09-29 LAB — CULTURE, OB URINE: Organism ID, Bacteria: NO GROWTH

## 2016-09-29 LAB — CERVICOVAGINAL ANCILLARY ONLY
Chlamydia: NEGATIVE
Neisseria Gonorrhea: NEGATIVE

## 2016-09-30 ENCOUNTER — Encounter: Payer: Self-pay | Admitting: Internal Medicine

## 2016-09-30 NOTE — Progress Notes (Signed)
Sent letter to inform of negative urine culture.

## 2016-10-05 ENCOUNTER — Other Ambulatory Visit (HOSPITAL_COMMUNITY): Payer: Self-pay

## 2016-10-05 ENCOUNTER — Ambulatory Visit (HOSPITAL_COMMUNITY)
Admission: RE | Admit: 2016-10-05 | Discharge: 2016-10-05 | Disposition: A | Discharge status: Home / Self Care | Payer: BC Managed Care – PPO | Source: Ambulatory Visit | Attending: Family Medicine | Admitting: Family Medicine

## 2016-10-05 ENCOUNTER — Other Ambulatory Visit: Payer: Self-pay | Admitting: Internal Medicine

## 2016-10-05 ENCOUNTER — Encounter (HOSPITAL_COMMUNITY): Payer: Self-pay

## 2016-10-05 DIAGNOSIS — Z3A16 16 weeks gestation of pregnancy: Secondary | ICD-10-CM

## 2016-10-05 DIAGNOSIS — O4690 Antepartum hemorrhage, unspecified, unspecified trimester: Secondary | ICD-10-CM

## 2016-10-05 DIAGNOSIS — O98512 Other viral diseases complicating pregnancy, second trimester: Secondary | ICD-10-CM | POA: Insufficient documentation

## 2016-10-05 DIAGNOSIS — O99342 Other mental disorders complicating pregnancy, second trimester: Secondary | ICD-10-CM | POA: Insufficient documentation

## 2016-10-05 DIAGNOSIS — B009 Herpesviral infection, unspecified: Secondary | ICD-10-CM | POA: Insufficient documentation

## 2016-10-05 DIAGNOSIS — O4692 Antepartum hemorrhage, unspecified, second trimester: Secondary | ICD-10-CM | POA: Diagnosis not present

## 2016-10-12 ENCOUNTER — Telehealth: Payer: Self-pay | Admitting: Psychology

## 2016-10-12 NOTE — Telephone Encounter (Signed)
Patient left a VM on my machine stating that she had gotten an ultrasound per her physician's recommendation but did not know that she would have to pay out of pocket.  Doesn't have the money to pay out of pocket.  States she can appeal Medicaid's denial of payment but needs [facility] to resubmit the necessary paperwork.    I don't fully understand the details (given this is not my specialty area).  Told her I would forward this message to [place], [facility] RN, the patient's PCP, and Dr. Pollie MeyerMcIntyre, one of OB faculty for review.

## 2016-10-13 NOTE — Telephone Encounter (Signed)
Will forward to [facility].  Clovis PuMartin, Tamika L, RN

## 2016-10-13 NOTE — Telephone Encounter (Signed)
Will check with referral coordinator to see if she knows how we can appeal this for patient. Jazmin Hartsell,CMA

## 2016-10-13 NOTE — Telephone Encounter (Signed)
I am unsure, as we have never had to get authorization for OB ultrasounds. If authorization was obtained, MFM would have done it.

## 2016-10-15 NOTE — Telephone Encounter (Signed)
I think this was because she had an attempted nuchal translucency on the 10/11, which could not be done due to fetal position. They did it again on 10/13 and were successful. I did get something in my inbox about it ([facility]) - I've given it to Tia to look into it. I think it's probably to do with how MFM billed the ultrasound, they may be able to help  Latrelle DodrillBrittany J McIntyre, MD

## 2016-10-15 NOTE — Telephone Encounter (Signed)
Attempted to call patient but number listed for her isn't in service.  I was able to reach her mother and ask her to have patient call us regarding her insurance.  Patient will be advised to bring the letter she [facility] to her next appointment.  Also mom mentioned that her [employer] is receiving claims from us regarding OB charges, but there has been no denial on their part yet.  Since patient is a pregnant dependent on [employer] insurance, [employer] will likely not cover charges for pregnancy.  Anything not covered is sent to her pregnancy medicaid to cover.  Jazmin Hartsell,CMA

## 2016-10-20 ENCOUNTER — Ambulatory Visit (HOSPITAL_COMMUNITY)
Admission: RE | Admit: 2016-10-20 | Discharge: 2016-10-20 | Disposition: A | Discharge status: Home / Self Care | Payer: BC Managed Care – PPO | Source: Ambulatory Visit | Attending: Family Medicine | Admitting: Family Medicine

## 2016-10-20 ENCOUNTER — Other Ambulatory Visit: Payer: Self-pay | Admitting: Internal Medicine

## 2016-10-20 DIAGNOSIS — Z3A19 19 weeks gestation of pregnancy: Secondary | ICD-10-CM | POA: Diagnosis not present

## 2016-10-20 DIAGNOSIS — Z363 Encounter for antenatal screening for malformations: Secondary | ICD-10-CM

## 2016-10-20 DIAGNOSIS — O0001 Abdominal pregnancy with intrauterine pregnancy: Secondary | ICD-10-CM

## 2016-11-05 ENCOUNTER — Encounter: Payer: Self-pay | Admitting: Licensed Clinical Social Worker

## 2016-11-05 ENCOUNTER — Ambulatory Visit (INDEPENDENT_AMBULATORY_CARE_PROVIDER_SITE_OTHER): Payer: BC Managed Care – PPO | Admitting: Family Medicine

## 2016-11-05 VITALS — BP 112/78 | HR 89 | Temp 98.0°F | Wt 129.0 lb

## 2016-11-05 DIAGNOSIS — Z3482 Encounter for supervision of other normal pregnancy, second trimester: Secondary | ICD-10-CM

## 2016-11-05 DIAGNOSIS — O26842 Uterine size-date discrepancy, second trimester: Secondary | ICD-10-CM

## 2016-11-05 NOTE — Patient Instructions (Addendum)
Thank you for coming to [facility] clinic today.  - We will set you up for an ultrasound. - You will need a glucose test at your next prenatal visit    Second Trimester of Pregnancy The second trimester is from week 13 through week 28 (months 4 through 6). The second trimester is often a time when you feel your best. Your body has also adjusted to being pregnant, and you begin to feel better physically. Usually, morning sickness has lessened or quit completely, you may have more energy, and you may have an increase in appetite. The second trimester is also a time when the fetus is growing rapidly. At the end of the sixth month, the fetus is about 9 inches long and weighs about 1 pounds. You will likely begin to feel the baby move (quickening) between 18 and 20 weeks of the pregnancy. Body changes during your second trimester Your body continues to go through many changes during your second trimester. The changes vary from woman to woman.  Your weight will continue to increase. You will notice your lower abdomen bulging out.  You may begin to get stretch marks on your hips, abdomen, and breasts.  You may develop headaches that can be relieved by medicines. The medicines should be approved by your health care provider.  You may urinate more often because the fetus is pressing on your bladder.  You may develop or continue to have heartburn as a result of your pregnancy.  You may develop constipation because certain hormones are causing the muscles that push waste through your intestines to slow down.  You may develop hemorrhoids or swollen, bulging veins (varicose veins).  You may have back pain. This is caused by:  Weight gain.  Pregnancy hormones that are relaxing the joints in your pelvis.  A shift in weight and the muscles that support your balance.  Your breasts will continue to grow and they will continue to become tender.  Your gums may bleed and may be sensitive to brushing and  flossing.  Dark spots or blotches (chloasma, mask of pregnancy) may develop on your face. This will likely fade after the baby is born.  A dark line from your belly button to the pubic area (linea nigra) may appear. This will likely fade after the baby is born.  You may have changes in your hair. These can include thickening of your hair, rapid growth, and changes in texture. Some women also have hair loss during or after pregnancy, or hair that feels dry or thin. Your hair will most likely return to normal after your baby is born. What to expect at prenatal visits During a routine prenatal visit:  You will be weighed to make sure you and the fetus are growing normally.  Your blood pressure will be taken.  Your abdomen will be measured to track your baby's growth.  The fetal heartbeat will be listened to.  Any test results from the previous visit will be discussed. Your health care provider may ask you:  How you are feeling.  If you are feeling the baby move.  If you have had any abnormal symptoms, such as leaking fluid, bleeding, severe headaches, or abdominal cramping.  If you are using any tobacco products, including cigarettes, chewing tobacco, and electronic cigarettes.  If you have any questions. Other tests that may be performed during your second trimester include:  Blood tests that check for:  Low iron levels (anemia).  Gestational diabetes (between 24 and 28 weeks).  Rh antibodies. This is to check for a protein on red blood cells (Rh factor).  Urine tests to check for infections, diabetes, or protein in the urine.  An ultrasound to confirm the proper growth and development of the baby.  An amniocentesis to check for possible genetic problems.  Fetal screens for spina bifida and Down syndrome.  HIV (human immunodeficiency virus) testing. Routine prenatal testing includes screening for HIV, unless you choose not to have this test. Follow these instructions at  home: Eating and drinking  Continue to eat regular, healthy meals.  Avoid raw meat, uncooked cheese, cat litter boxes, and soil used by cats. These carry germs that can cause birth defects in the baby.  Take your prenatal vitamins.  Take 1500-2000 mg of calcium daily starting at the 20th week of pregnancy until you deliver your baby.  If you develop constipation:  Take over-the-counter or prescription medicines.  Drink enough fluid to keep your urine clear or pale yellow.  Eat foods that are high in fiber, such as fresh fruits and vegetables, whole grains, and beans.  Limit foods that are high in fat and processed sugars, such as fried and sweet foods. Activity  Exercise only as directed by your health care provider. Experiencing uterine cramps is a good sign to stop exercising.  Avoid heavy lifting, wear low heel shoes, and practice good posture.  Wear your seat belt at all times when driving.  Rest with your legs elevated if you have leg cramps or low back pain.  Wear a good support bra for breast tenderness.  Do not use hot tubs, steam rooms, or saunas. Lifestyle  Avoid all smoking, herbs, alcohol, and unprescribed drugs. These chemicals affect the formation and growth of the baby.  Do not use any products that contain nicotine or tobacco, such as cigarettes and e-cigarettes. If you need help quitting, ask your health care provider.  A sexual relationship may be continued unless your health care provider directs you otherwise. General instructions  Follow your health care provider's instructions regarding medicine use. There are medicines that are either safe or unsafe to take during pregnancy.  Take warm sitz baths to soothe any pain or discomfort caused by hemorrhoids. Use hemorrhoid cream if your health care provider approves.  If you develop varicose veins, wear support hose. Elevate your feet for 15 minutes, 3-4 times a day. Limit salt in your diet.  Visit your  dentist if you have not gone yet during your pregnancy. Use a soft toothbrush to brush your teeth and be gentle when you floss.  Keep all follow-up prenatal visits as told by your health care provider. This is important. Contact a health care provider if:  You have dizziness.  You have mild pelvic cramps, pelvic pressure, or nagging pain in the abdominal area.  You have persistent nausea, vomiting, or diarrhea.  You have a bad smelling vaginal discharge.  You have pain with urination. Get help right away if:  You have a fever.  You are leaking fluid from your vagina.  You have spotting or bleeding from your vagina.  You have severe abdominal cramping or pain.  You have rapid weight gain or weight loss.  You have shortness of breath with chest pain.  You notice sudden or extreme swelling of your face, hands, ankles, feet, or legs.  You have not felt your baby move in over an hour.  You have severe headaches that do not go away with medicine.  You have vision  changes. Summary  The second trimester is from week 13 through week 28 (months 4 through 6). It is also a time when the fetus is growing rapidly.  Your body goes through many changes during pregnancy. The changes vary from woman to woman.  Avoid all smoking, herbs, alcohol, and unprescribed drugs. These chemicals affect the formation and growth your baby.  Do not use any tobacco products, such as cigarettes, chewing tobacco, and e-cigarettes. If you need help quitting, ask your health care provider.  Contact your health care provider if you have any questions. Keep all prenatal visits as told by your health care provider. This is important. This information is not intended to replace advice given to you by your health care provider. Make sure you discuss any questions you have with your health care provider. Document Released: 11/10/2001 Document Revised: 04/23/2016 Document Reviewed: 01/17/2013 Elsevier Interactive  Patient Education  2017 [employer]
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
## 2016-11-10 ENCOUNTER — Ambulatory Visit (HOSPITAL_COMMUNITY)
Admission: RE | Admit: 2016-11-10 | Discharge: 2016-11-10 | Disposition: A | Discharge status: Home / Self Care | Payer: BC Managed Care – PPO | Source: Ambulatory Visit | Attending: Family Medicine | Admitting: Family Medicine

## 2016-11-10 DIAGNOSIS — O26842 Uterine size-date discrepancy, second trimester: Secondary | ICD-10-CM | POA: Diagnosis not present

## 2016-11-10 DIAGNOSIS — Z3A22 22 weeks gestation of pregnancy: Secondary | ICD-10-CM | POA: Insufficient documentation

## 2016-11-10 DIAGNOSIS — B009 Herpesviral infection, unspecified: Secondary | ICD-10-CM | POA: Diagnosis not present

## 2016-11-10 DIAGNOSIS — O98512 Other viral diseases complicating pregnancy, second trimester: Secondary | ICD-10-CM | POA: Diagnosis not present

## 2016-11-10 DIAGNOSIS — O99342 Other mental disorders complicating pregnancy, second trimester: Secondary | ICD-10-CM | POA: Insufficient documentation

## 2016-11-13 ENCOUNTER — Telehealth: Payer: Self-pay | Admitting: Internal Medicine

## 2016-11-13 ENCOUNTER — Telehealth: Payer: Self-pay | Admitting: Family Medicine

## 2016-11-13 NOTE — Telephone Encounter (Signed)
Mom has some concerns about pt's baby. Mom states pt is not in a good place and has a tendency to lie. Mom wants to know about the u/s done on 11/10/16. Pt told her that her cervix was dilated and that she could loose the baby. Mom wants to know if this is true. Mom was also told by pt that there is a 90% chance baby would have down syndrome, is that true? Mom would like to speak with PCP. [phone]. Please advise. Thanks! ep
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
## 2016-11-16 ENCOUNTER — Telehealth: Payer: Self-pay | Admitting: Internal Medicine
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
## 2016-11-18 NOTE — Telephone Encounter (Signed)
Informed patient of ultrasound results, patient has already scheduled prenatal visit for 12/04/16. Maryjean Mornempestt S Roberts, CMA

## 2016-11-30 NOTE — L&D Delivery Note (Signed)
Delivery Note Pt admitted for IOL d/t postdates. Received cytotec, foley bulb, pitocin and AROM. Pt pushed well x 3 hours. Head crowning for multiple pushing attempts. Concern for potential shoulder dystocia. Our attending in OR w/ stat c/s. Called for any OB in house to be available for delivery if assistance needed. Dorathy Kinsman, CNM in as baby still crowning. FHR now down to 80s so midline episiotomy by me- skin taught against fetal head, only small incision made. [place] able to deliver head, shoulders not forthcoming. McRobert's and suprapubic w/o release of shoulder. Dr. Normand Sloop in. NICU team called. Attempted to deliver posterior arm w/o success. [place] unsuccessful. Hands and knees w/ eventual delivery of Rt arm, then back to McRoberts w/ delivery of remainder of viable female infant at 5:38 AM. Cord was immediately cut and baby transferred to NICU team. Total dystocia time approximately 5 minutes.  Arterial and venous samples collected for gas. Placenta was delivered with gentle traction, 3VC without abnormalities. Patient had moderate bleeding following placenta delivery. 2nd degree perineal laceration was noted and repaired. Multiple large clots were removed from uterus. Cytotec PR for increased bleeding. FF U-1. Dr. Adrian Blackwater in to assess cervix. Cervix was thoroughly evaluated for laceration and was found intact.  Cord pH 7.310  Everything discussed in depth with parents by myself, Dr. Normand Sloop, and Dr. Adrian Blackwater  Anesthesia: Epidural   Episiotomy: Performed Lacerations: 2nd degree laceration  Suture Repair: 3.0 vicryl Est. Blood Loss (mL):  Mom to postpartum.  Baby to NICU.  Cheral Marker, CNM, [facility] 03/24/2017 8:11 AM

## 2016-12-03 DIAGNOSIS — Z22330 Carrier of Group B streptococcus: Secondary | ICD-10-CM | POA: Insufficient documentation

## 2016-12-04 ENCOUNTER — Ambulatory Visit (INDEPENDENT_AMBULATORY_CARE_PROVIDER_SITE_OTHER): Payer: BC Managed Care – PPO | Admitting: Internal Medicine

## 2016-12-04 VITALS — BP 100/60 | HR 90 | Temp 98.4°F | Wt 131.0 lb

## 2016-12-04 DIAGNOSIS — Z3492 Encounter for supervision of normal pregnancy, unspecified, second trimester: Secondary | ICD-10-CM

## 2016-12-04 DIAGNOSIS — Z3482 Encounter for supervision of other normal pregnancy, second trimester: Secondary | ICD-10-CM

## 2016-12-04 NOTE — Patient Instructions (Signed)
Please try Tums for your acid reflux. Give us a call if this does not work.  Please make a follow up OB appointment in 3 weeks (when you are 28 weeks) Make a lab visit for your glucose tolerance test in about 1 week.
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
## 2016-12-11 ENCOUNTER — Other Ambulatory Visit (INDEPENDENT_AMBULATORY_CARE_PROVIDER_SITE_OTHER): Payer: BC Managed Care – PPO

## 2016-12-11 ENCOUNTER — Encounter: Payer: Self-pay | Admitting: Internal Medicine

## 2016-12-11 DIAGNOSIS — Z3482 Encounter for supervision of other normal pregnancy, second trimester: Secondary | ICD-10-CM

## 2016-12-11 LAB — POCT 1 HR PRENATAL GLUCOSE: Glucose 1 Hr Prenatal, POC: 123 mg/dL

## 2016-12-11 NOTE — Progress Notes (Signed)
[facility] to mail to patient regarding normal GTT test .

## 2016-12-14 ENCOUNTER — Encounter (HOSPITAL_COMMUNITY): Payer: Self-pay | Admitting: Emergency Medicine

## 2016-12-14 ENCOUNTER — Observation Stay (HOSPITAL_COMMUNITY)
Admission: EM | Admit: 2016-12-14 | Discharge: 2016-12-15 | Disposition: A | Discharge status: Home / Self Care | Payer: BC Managed Care – PPO | Attending: Obstetrics & Gynecology | Admitting: Obstetrics & Gynecology

## 2016-12-14 ENCOUNTER — Emergency Department (HOSPITAL_COMMUNITY): Payer: BC Managed Care – PPO

## 2016-12-14 DIAGNOSIS — R1010 Upper abdominal pain, unspecified: Secondary | ICD-10-CM

## 2016-12-14 DIAGNOSIS — O99820 Streptococcus B carrier state complicating pregnancy: Secondary | ICD-10-CM | POA: Diagnosis not present

## 2016-12-14 DIAGNOSIS — O9A212 Injury, poisoning and certain other consequences of external causes complicating pregnancy, second trimester: Secondary | ICD-10-CM | POA: Diagnosis not present

## 2016-12-14 DIAGNOSIS — S3991XA Unspecified injury of abdomen, initial encounter: Secondary | ICD-10-CM | POA: Diagnosis not present

## 2016-12-14 DIAGNOSIS — Z87891 Personal history of nicotine dependence: Secondary | ICD-10-CM | POA: Diagnosis not present

## 2016-12-14 DIAGNOSIS — O9A312 Physical abuse complicating pregnancy, second trimester: Secondary | ICD-10-CM | POA: Diagnosis not present

## 2016-12-14 DIAGNOSIS — Y9389 Activity, other specified: Secondary | ICD-10-CM | POA: Insufficient documentation

## 2016-12-14 DIAGNOSIS — Z3A26 26 weeks gestation of pregnancy: Secondary | ICD-10-CM

## 2016-12-14 DIAGNOSIS — Y929 Unspecified place or not applicable: Secondary | ICD-10-CM | POA: Diagnosis not present

## 2016-12-14 DIAGNOSIS — Y998 Other external cause status: Secondary | ICD-10-CM | POA: Insufficient documentation

## 2016-12-14 DIAGNOSIS — S0083XA Contusion of other part of head, initial encounter: Secondary | ICD-10-CM | POA: Diagnosis not present

## 2016-12-14 LAB — KLEIHAUER-BETKE STAIN
# Vials RhIg: 1
Fetal Cells %: 0 %
Quantitation Fetal Hemoglobin: 0 mL

## 2016-12-14 LAB — CBC
HCT: 27.5 % — ABNORMAL LOW (ref 36.0–46.0)
Hemoglobin: 9.6 g/dL — ABNORMAL LOW (ref 12.0–15.0)
MCH: 30.6 pg (ref 26.0–34.0)
MCHC: 34.9 g/dL (ref 30.0–36.0)
MCV: 87.6 fL (ref 78.0–100.0)
Platelets: 225 10*3/uL (ref 150–400)
RBC: 3.14 MIL/uL — ABNORMAL LOW (ref 3.87–5.11)
RDW: 13.4 % (ref 11.5–15.5)
WBC: 7.8 10*3/uL (ref 4.0–10.5)

## 2016-12-14 LAB — ABO/RH: ABO/RH(D): B POS

## 2016-12-14 LAB — TYPE AND SCREEN
ABO/RH(D): B POS
Antibody Screen: NEGATIVE

## 2016-12-14 MED ORDER — ACETAMINOPHEN 325 MG PO TABS
650.0000 mg | ORAL_TABLET | ORAL | Status: DC | PRN
  Administered 2016-12-14 – 2016-12-15 (×3): 650 mg via ORAL
  Filled 2016-12-14 (×3): qty 2

## 2016-12-14 MED ORDER — SODIUM CHLORIDE 0.9 % IV BOLUS (SEPSIS)
500.0000 mL | Freq: Once | INTRAVENOUS | Status: AC
  Administered 2016-12-14: 500 mL via INTRAVENOUS

## 2016-12-14 MED ORDER — CYCLOBENZAPRINE HCL 5 MG PO TABS
5.0000 mg | ORAL_TABLET | Freq: Three times a day (TID) | ORAL | Status: DC | PRN
  Administered 2016-12-14 – 2016-12-15 (×3): 5 mg via ORAL
  Filled 2016-12-14 (×5): qty 1

## 2016-12-14 MED ORDER — PRENATAL MULTIVITAMIN CH
1.0000 | ORAL_TABLET | Freq: Every day | ORAL | Status: DC
  Filled 2016-12-14: qty 1

## 2016-12-14 MED ORDER — LACTATED RINGERS IV SOLN
INTRAVENOUS | Status: AC
  Administered 2016-12-14: 15:00:00 via INTRAVENOUS

## 2016-12-14 NOTE — ED Notes (Signed)
Notified [facility] for transport to [facility] 

## 2016-12-14 NOTE — ED Notes (Signed)
Rapid OB RN at bedside.  

## 2016-12-14 NOTE — Progress Notes (Signed)
Report given to day shift RR[facility] Montez MoritaErin Hampton, [facility]

## 2016-12-14 NOTE — ED Notes (Signed)
Pt ambulatory to restroom with OB RN and significant other

## 2016-12-14 NOTE — ED Notes (Signed)
Patient transported to CT 
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
## 2016-12-14 NOTE — ED Notes (Signed)
Ice water provided.
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
## 2016-12-14 NOTE — ED Notes (Signed)
OB rapid response called. 
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
## 2016-12-15 DIAGNOSIS — S3981XD Other specified injuries of abdomen, subsequent encounter: Secondary | ICD-10-CM

## 2016-12-15 DIAGNOSIS — O9A212 Injury, poisoning and certain other consequences of external causes complicating pregnancy, second trimester: Principal | ICD-10-CM

## 2016-12-15 DIAGNOSIS — Z3A26 26 weeks gestation of pregnancy: Secondary | ICD-10-CM | POA: Diagnosis not present

## 2016-12-15 MED ORDER — PRENATAL MULTIVITAMIN CH: 1.0000 | ORAL_TABLET | Freq: Every day | ORAL | 1 refills | Status: DC

## 2016-12-15 MED ORDER — CYCLOBENZAPRINE HCL 10 MG PO TABS: 10.0000 mg | ORAL_TABLET | Freq: Two times a day (BID) | ORAL | 0 refills | Status: DC | PRN
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
## 2016-12-15 NOTE — Progress Notes (Signed)
Pt discharged home in stable condition.  Discharge instructions and Rx's reviewed and copies given with pt understanding verbalized.  Pt accompanied off unit by Carmie KannerBrittany Martin, CNA and pt's boyfriend.

## 2016-12-28 ENCOUNTER — Telehealth: Payer: Self-pay | Admitting: Internal Medicine

## 2016-12-28 ENCOUNTER — Ambulatory Visit (INDEPENDENT_AMBULATORY_CARE_PROVIDER_SITE_OTHER): Payer: BC Managed Care – PPO | Admitting: Internal Medicine

## 2016-12-28 VITALS — BP 100/59 | HR 100 | Temp 98.2°F | Wt 137.0 lb

## 2016-12-28 DIAGNOSIS — Z3493 Encounter for supervision of normal pregnancy, unspecified, third trimester: Secondary | ICD-10-CM | POA: Diagnosis not present

## 2016-12-28 DIAGNOSIS — O26843 Uterine size-date discrepancy, third trimester: Secondary | ICD-10-CM

## 2016-12-28 DIAGNOSIS — Z23 Encounter for immunization: Secondary | ICD-10-CM

## 2016-12-28 DIAGNOSIS — R8290 Unspecified abnormal findings in urine: Secondary | ICD-10-CM | POA: Diagnosis not present

## 2016-12-28 LAB — POCT URINALYSIS DIPSTICK
Bilirubin, UA: NEGATIVE
Blood, UA: NEGATIVE
Glucose, UA: NEGATIVE
Ketones, UA: NEGATIVE
Leukocytes, UA: NEGATIVE
Nitrite, UA: NEGATIVE
Protein, UA: NEGATIVE
Spec Grav, UA: 1.025
Urobilinogen, UA: 0.2
pH, UA: 6.5

## 2016-12-28 LAB — CBC
HCT: 30.2 % — ABNORMAL LOW (ref 35.0–45.0)
Hemoglobin: 10.1 g/dL — ABNORMAL LOW (ref 11.7–15.5)
MCH: 29.5 pg (ref 27.0–33.0)
MCHC: 33.4 g/dL (ref 32.0–36.0)
MCV: 88.3 fL (ref 80.0–100.0)
MPV: 9.4 fL (ref 7.5–12.5)
Platelets: 244 10*3/uL (ref 140–400)
RBC: 3.42 MIL/uL — ABNORMAL LOW (ref 3.80–5.10)
RDW: 13.3 % (ref 11.0–15.0)
WBC: 6.4 10*3/uL (ref 3.8–10.8)

## 2016-12-28 MED ORDER — FERROUS SULFATE 300 (60 FE) MG/5ML PO SYRP
325.0000 mg | ORAL_SOLUTION | Freq: Every day | ORAL | 3 refills | Status: DC
Start: 2016-12-28 — End: 2016-12-31

## 2016-12-28 NOTE — Telephone Encounter (Signed)
Called patient to report negative HIV and RPR. CBC with hgb 10.1. Will start Ferrous sulfate 325mg  daily. Discussed how to take medication and also diet changes to help prevent constipation. Questions answered.
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
## 2016-12-28 NOTE — Patient Instructions (Addendum)
Please follow up in OB clinic in 2 weeks We will get an Ultrasound to check baby's growth  We will get labs today and test your urine.   Preterm Labor and Birth Information The normal length of a pregnancy is 39-41 weeks. Preterm labor is when labor starts before 37 completed weeks of pregnancy. What are the risk factors for preterm labor? Preterm labor is more likely to occur in women who: Have certain infections during pregnancy such as a bladder infection, sexually transmitted infection, or infection inside the uterus (chorioamnionitis). Have a shorter-than-normal cervix. Have gone into preterm labor before. Have had surgery on their cervix. Are younger than age 23 or older than age 23. Are African American. Are pregnant with twins or multiple babies (multiple gestation). Take street drugs or smoke while pregnant. Do not gain enough weight while pregnant. Became pregnant shortly after having been pregnant. What are the symptoms of preterm labor? Symptoms of preterm labor include: Cramps similar to those that can happen during a menstrual period. The cramps may happen with diarrhea. Pain in the abdomen or lower back. Regular uterine contractions that may feel like tightening of the abdomen. A feeling of increased pressure in the pelvis. Increased watery or bloody mucus discharge from the vagina. Water breaking (ruptured amniotic sac). Why is it important to recognize signs of preterm labor? It is important to recognize signs of preterm labor because babies who are born prematurely may not be fully developed. This can put them at an increased risk for: Long-term (chronic) heart and lung problems. Difficulty immediately after birth with regulating body systems, including blood sugar, body temperature, heart rate, and breathing rate. Bleeding in the brain. Cerebral palsy. Learning difficulties. Death. These risks are highest for babies who are born before 34 weeks of pregnancy. How  is preterm labor treated? Treatment depends on the length of your pregnancy, your condition, and the health of your baby. It may involve: Having a stitch (suture) placed in your cervix to prevent your cervix from opening too early (cerclage). Taking or being given medicines, such as: Hormone medicines. These may be given early in pregnancy to help support the pregnancy. Medicine to stop contractions. Medicines to help mature the baby's lungs. These may be prescribed if the risk of delivery is high. Medicines to prevent your baby from developing cerebral palsy. If the labor happens before 34 weeks of pregnancy, you may need to stay in the hospital. What should I do if I think I am in preterm labor? If you think that you are going into preterm labor, call your health care provider right away. How can I prevent preterm labor in future pregnancies? To increase your chance of having a full-term pregnancy: Do not use any tobacco products, such as cigarettes, chewing tobacco, and e-cigarettes. If you need help quitting, ask your health care provider. Do not use street drugs or medicines that have not been prescribed to you during your pregnancy. Talk with your health care provider before taking any herbal supplements, even if you have been taking them regularly. Make sure you gain a healthy amount of weight during your pregnancy. Watch for infection. If you think that you might have an infection, get it checked right away. Make sure to tell your health care provider if you have gone into preterm labor before. This information is not intended to replace advice given to you by your health care provider. Make sure you discuss any questions you have with your health care provider. Document Released:  02/06/2004 Document Revised: 04/28/2016 Document Reviewed: 04/08/2016 Elsevier Interactive Patient Education  2017 [employer] Introduction Patient Name: ________________________________________________  Patient Due Date: ____________________ What is a fetal movement count? A fetal movement count is the number of times that you feel your baby move during a certain amount of time. This may also be called a fetal kick count. A fetal movement count is recommended for every pregnant woman. You may be asked to start counting fetal movements as early as week 28 of your pregnancy. Pay attention to when your baby is most active. You may notice your baby's sleep and wake cycles. You may also notice things that make your baby move more. You should do a fetal movement count:  When your baby is normally most active.  At the same time each day. A good time to count movements is while you are resting, after having something to eat and drink. How do I count fetal movements? 1. Find a quiet, comfortable area. Sit, or lie down on your side. 2. Write down the date, the start time and stop time, and the number of movements that you felt between those two times. Take this information with you to your health care visits. 3. For 2 hours, count kicks, flutters, swishes, rolls, and jabs. You should feel at least 10 movements during 2 hours. 4. You may stop counting after you have felt 10 movements. 5. If you do not feel 10 movements in 2 hours, have something to eat and drink. Then, keep resting and counting for 1 hour. If you feel at least 4 movements during that hour, you may stop counting. Contact a health care provider if:  You feel fewer than 4 movements in 2 hours.  Your baby is not moving like he or she usually does. Date: ____________ Start time: ____________ Stop time: ____________ Movements: ____________ Date: ____________ Start time: ____________ Stop time: ____________ Movements: ____________ Date: ____________ Start time: ____________ Stop time: ____________ Movements: ____________ Date: ____________ Start time: ____________ Stop time: ____________ Movements: ____________ Date: ____________ Start time:  ____________ Stop time: ____________ Movements: ____________ Date: ____________ Start time: ____________ Stop time: ____________ Movements: ____________ Date: ____________ Start time: ____________ Stop time: ____________ Movements: ____________ Date: ____________ Start time: ____________ Stop time: ____________ Movements: ____________ Date: ____________ Start time: ____________ Stop time: ____________ Movements: ____________ This information is not intended to replace advice given to you by your health care provider. Make sure you discuss any questions you have with your health care provider. Document Released: 12/16/2006 Document Revised: 07/15/2016 Document Reviewed: 12/26/2015 Elsevier Interactive Patient Education  2017 [employer].

## 2016-12-29 LAB — HIV ANTIBODY (ROUTINE TESTING W REFLEX): HIV 1&2 Ab, 4th Generation: NONREACTIVE

## 2016-12-29 LAB — RPR

## 2016-12-30 ENCOUNTER — Telehealth: Payer: Self-pay | Admitting: Internal Medicine

## 2016-12-30 NOTE — Telephone Encounter (Signed)
Pt was unable to get the  RX for iron due to the cost. She bought one OTC but wants to talk to dr Ottie GlazierGunadasa if this is the right kind.

## 2016-12-30 NOTE — Telephone Encounter (Signed)
Will forward to MD. Jazmin Hartsell,CMA  

## 2016-12-31 NOTE — Telephone Encounter (Signed)
Called patient to discuss. It seems like she has the appropriate dose of iron.

## 2017-01-04 ENCOUNTER — Ambulatory Visit (HOSPITAL_COMMUNITY)
Admission: RE | Admit: 2017-01-04 | Discharge: 2017-01-04 | Disposition: A | Discharge status: Home / Self Care | Payer: BC Managed Care – PPO | Source: Ambulatory Visit | Attending: Family Medicine | Admitting: Family Medicine

## 2017-01-04 ENCOUNTER — Other Ambulatory Visit: Payer: Self-pay | Admitting: Internal Medicine

## 2017-01-04 DIAGNOSIS — Z3A29 29 weeks gestation of pregnancy: Secondary | ICD-10-CM | POA: Diagnosis not present

## 2017-01-04 DIAGNOSIS — O98513 Other viral diseases complicating pregnancy, third trimester: Secondary | ICD-10-CM

## 2017-01-04 DIAGNOSIS — O99343 Other mental disorders complicating pregnancy, third trimester: Secondary | ICD-10-CM

## 2017-01-04 DIAGNOSIS — B009 Herpesviral infection, unspecified: Secondary | ICD-10-CM

## 2017-01-04 DIAGNOSIS — Z362 Encounter for other antenatal screening follow-up: Secondary | ICD-10-CM

## 2017-01-04 DIAGNOSIS — O26843 Uterine size-date discrepancy, third trimester: Secondary | ICD-10-CM | POA: Diagnosis not present

## 2017-01-05 ENCOUNTER — Telehealth: Payer: Self-pay | Admitting: Internal Medicine

## 2017-01-05 IMAGING — US US MFM OB COMPLETE +14 WKS
1 series · 15 of 23 positions shown · non-contrast
Comparison: none

[Series 1: us mfm ob complete +14 wks · 15 of 23 slices shown]
[im 1/23]
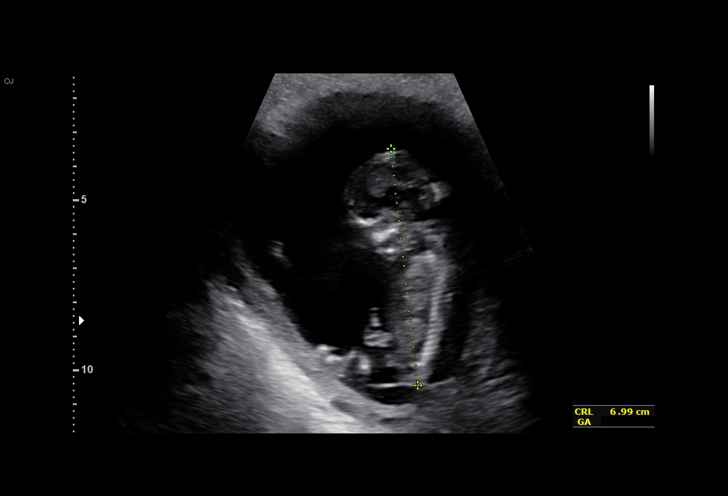
[im 3/23]
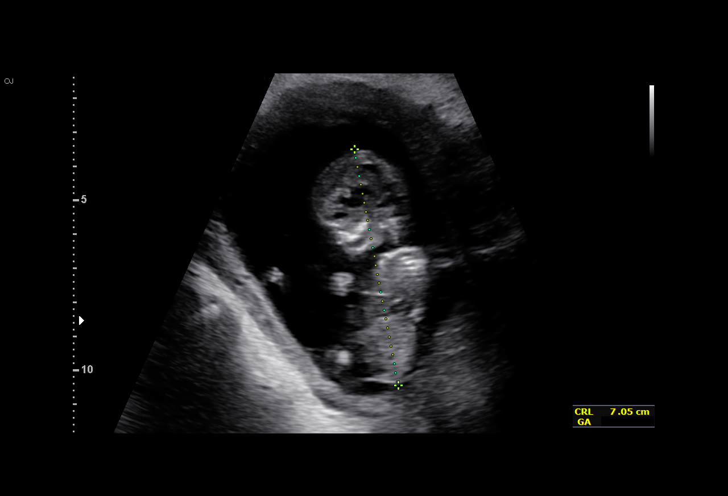
[im 4/23]
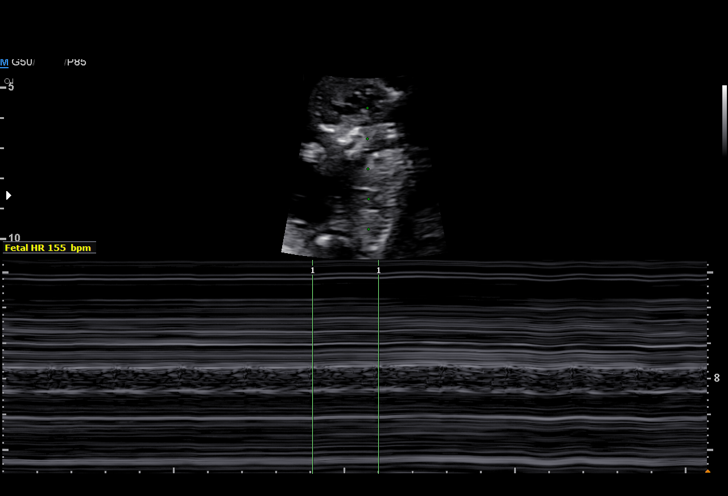
[im 6/23]
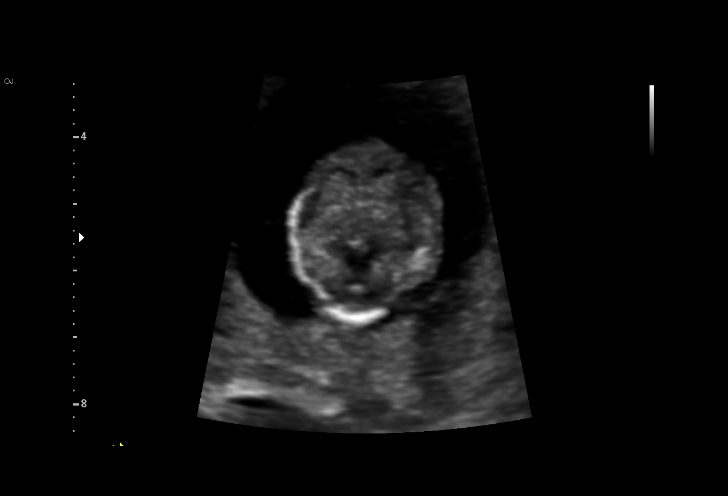
[im 7/23]
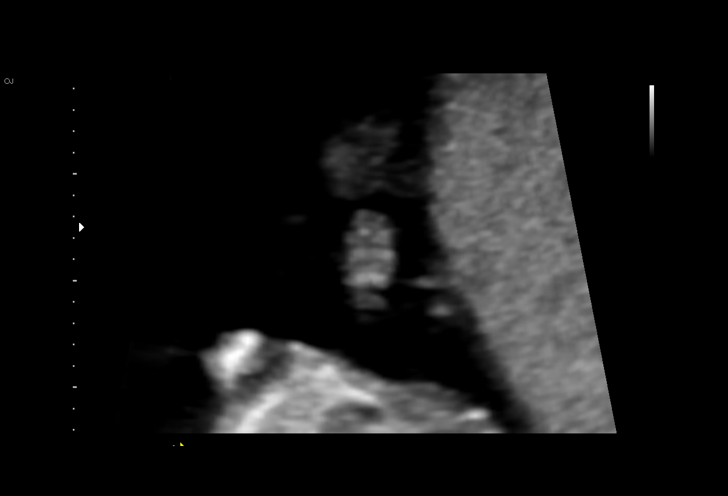
[im 9/23]
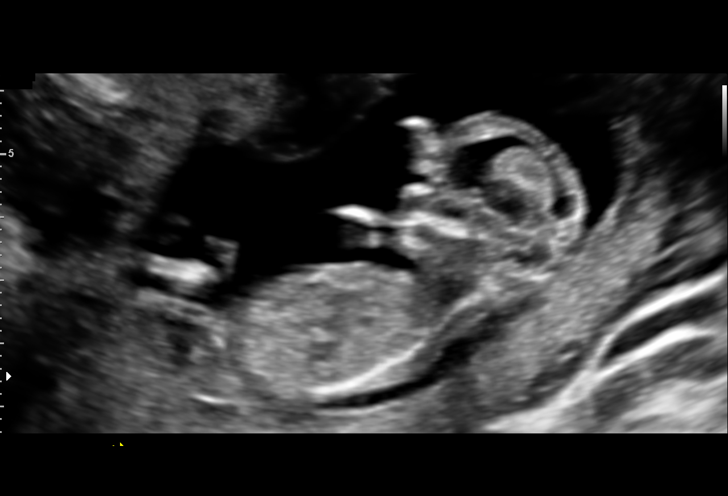
[im 10/23]
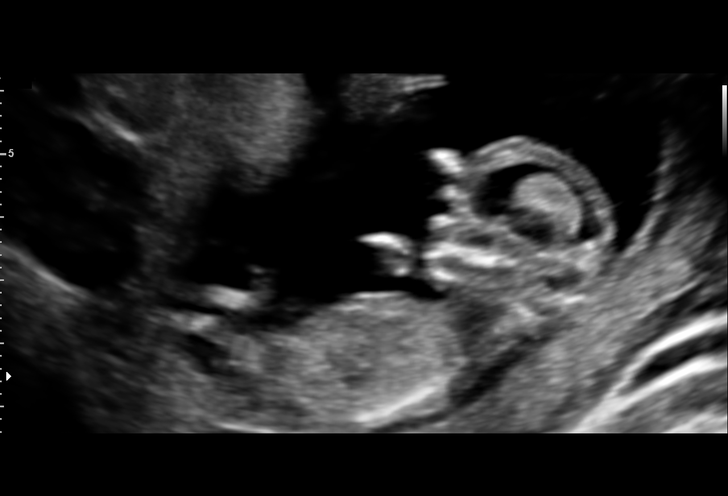
[im 12/23]
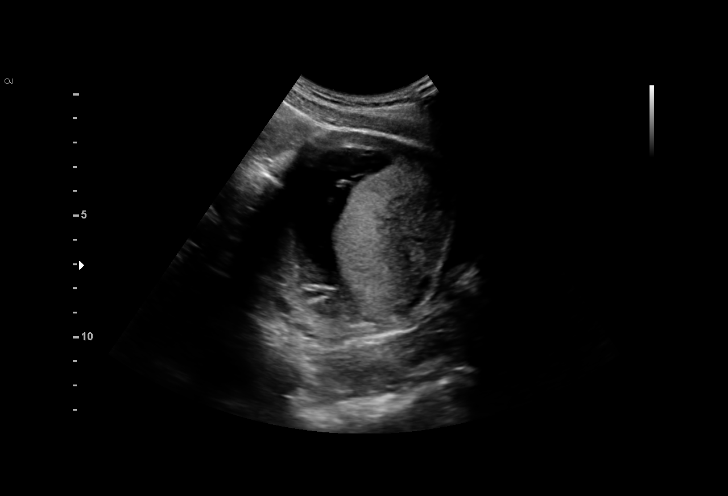
[im 14/23]
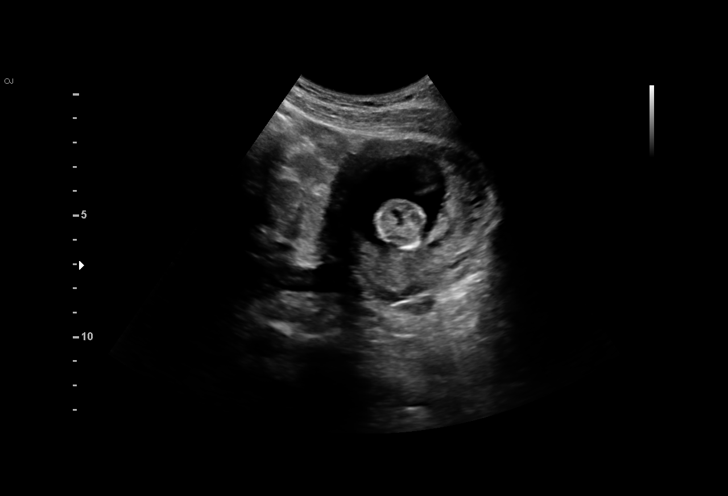
[im 15/23]
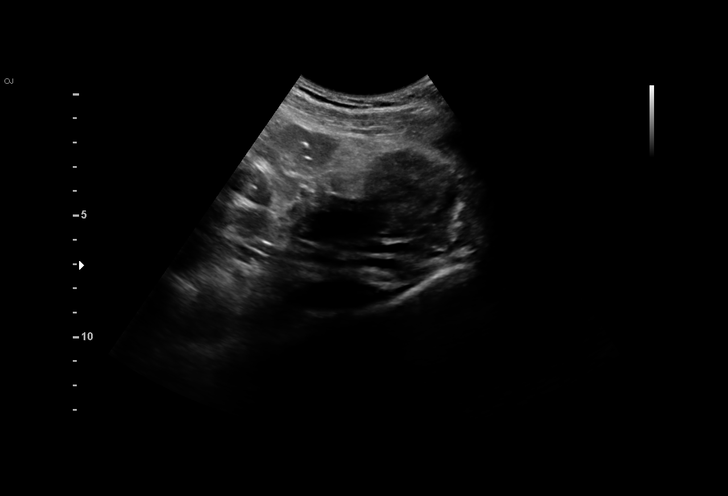
[im 17/23]
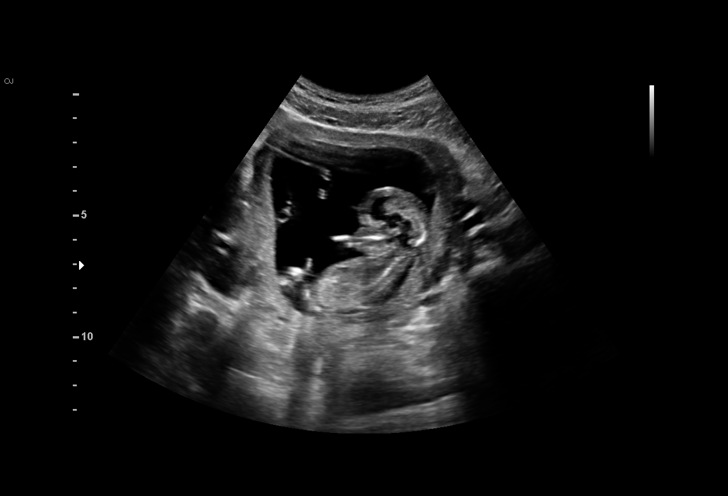
[im 18/23]
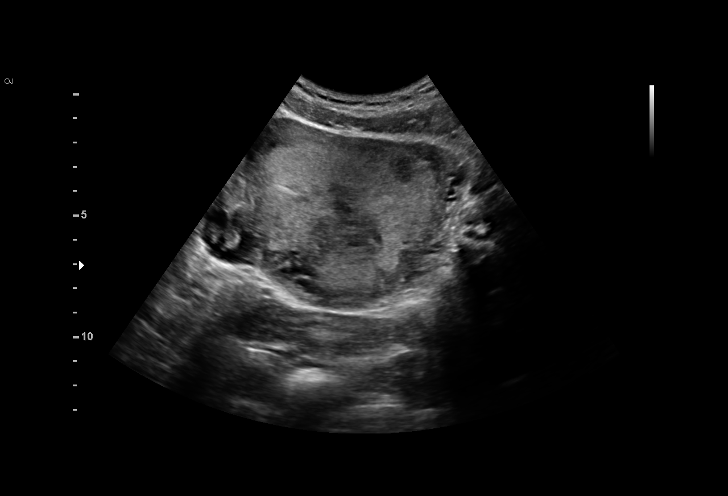
[im 20/23]
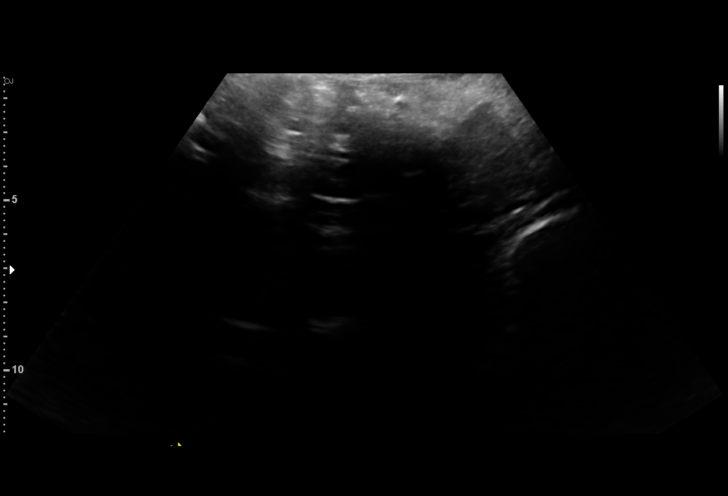
[im 21/23]
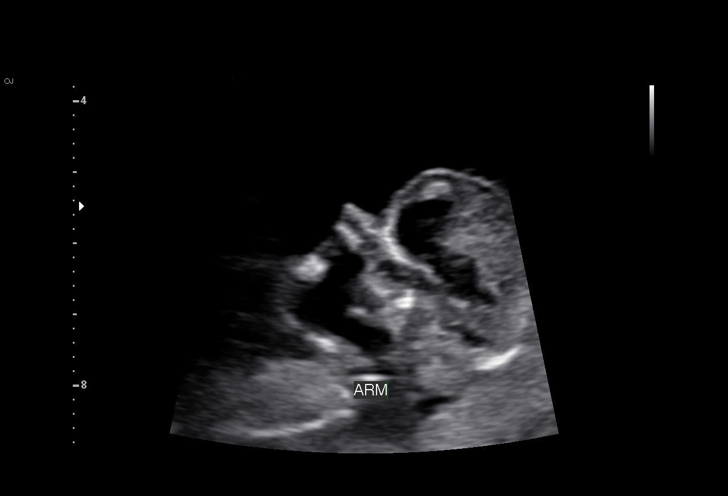
[im 23/23]
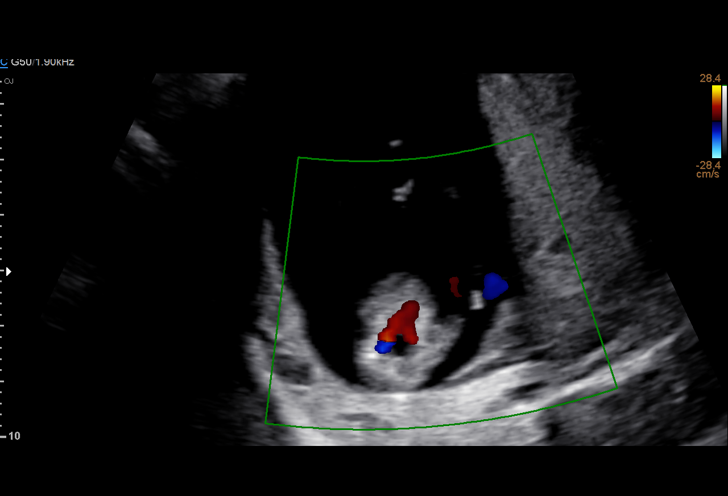

[15 of 23 positions shown; findings below may reference images not displayed]

pm)

WEEKS

1  ISMET ADEM                 [id number]      [id number]     [id number]
KHIRUD
Indications

13 weeks gestation of pregnancy
OB History

Blood Type:            Height:  5'0"   Weight (lb):  115       BMI:
Gravidity:    2         Term:   0        Prem:   0        SAB:   1
TOP:          0       Ectopic:  0        Living: 0
Fetal Evaluation

Num Of Fetuses:     1
Preg. Location:     Intrauterine
Gest. Sac:          Intrauterine
Fetal Pole:         Visualized
Fetal Heart         155
Rate(bpm):
Cardiac Activity:   Observed
Biometry

CRL:      68.8  mm     G. Age:  13w 0d                  EDD:   03/17/17
Gestational Age

LMP:           13w 1d        Date:  06/09/16                 EDD:   03/16/17
Best:          13w 1d     Det. By:  LMP  (06/09/16)          EDD:   03/16/17
1st Trimester Genetic Sonogram Screening

CRL:            68.8  mm    G. Age:   13w 0d                 EDD:   03/17/17

Nasal Bone:                 Present
Anatomy

Cranium:               Appears normal         Bladder:                Appears normal
Choroid Plexus:        Appears normal         Upper Extremities:      Noted
Stomach:               Appears normal, left   Lower Extremities:      Noted
sided
Cord Vessels:          Appears normal (3
vessel cord)
Cervix Uterus Adnexa

Uterus
No abnormality visualized.

Left Ovary
Not visualized. No adnexal mass visualized.

Right Ovary
Within normal limits.
Impression

Single IUP at 13w 1d
Unable to measure an adequate NT due to fetal position
A nasal bone was visualized
Recommendations

Patient will follow up next week for second attempt at NT
measurement.

## 2017-01-05 NOTE — Telephone Encounter (Signed)
Pt informed of normal ultrasound.

## 2017-01-05 NOTE — Telephone Encounter (Signed)
Please let patient know that her most recent OB ultrasound was normal. Thank you

## 2017-01-07 IMAGING — US US MFM FETAL NUCHAL TRANSLUCENCY
1 series · 14 of 28 positions shown · non-contrast
Comparison: none

[Series 1: us mfm fetal nuchal translucency · 34 acquisitions, 14 frames shown]
[im 2/34]
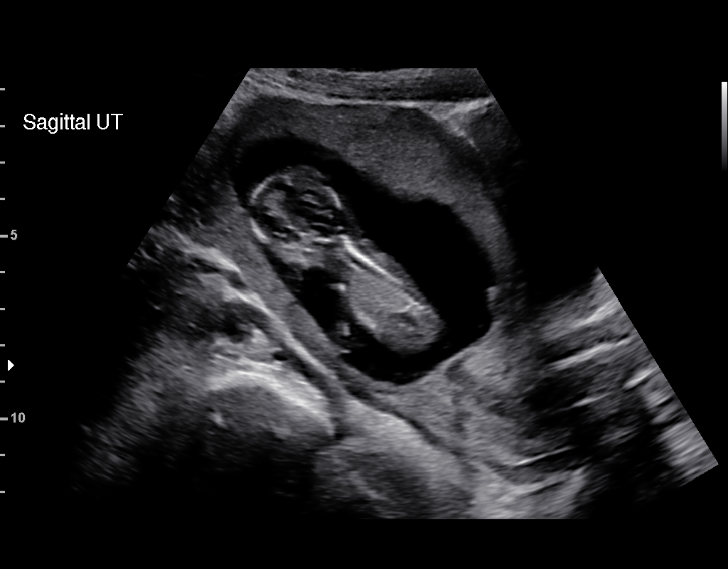
[im 4/34]
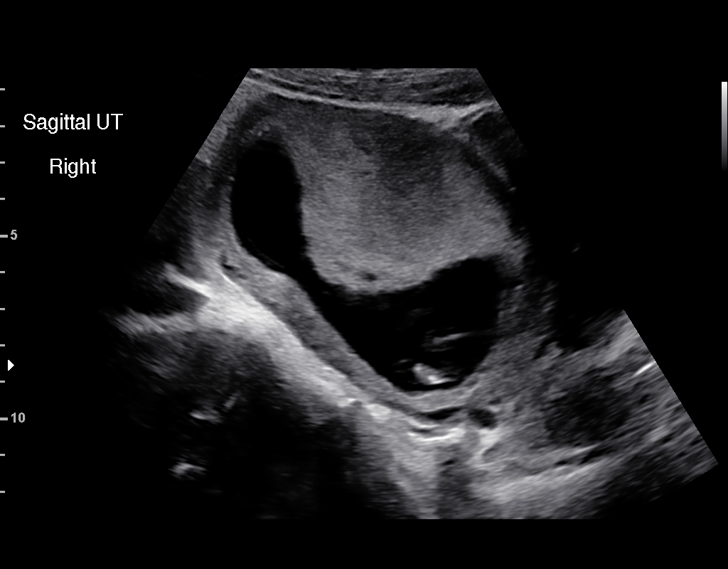
[im 7/34]
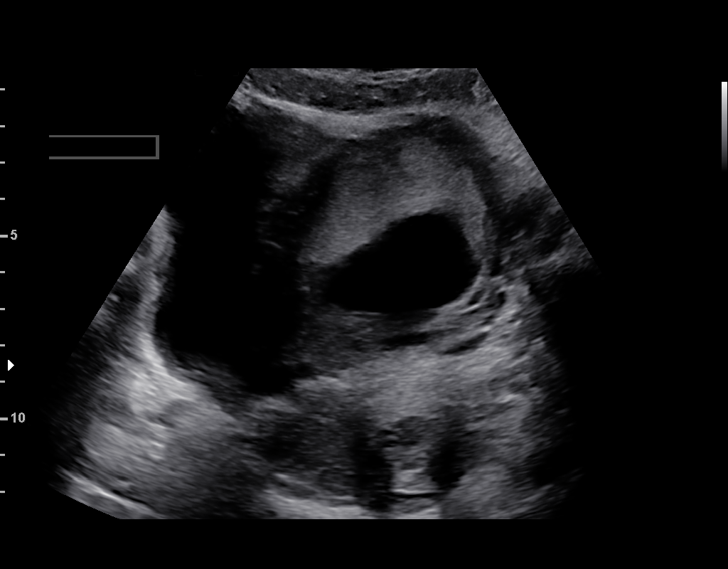
[im 9/34]
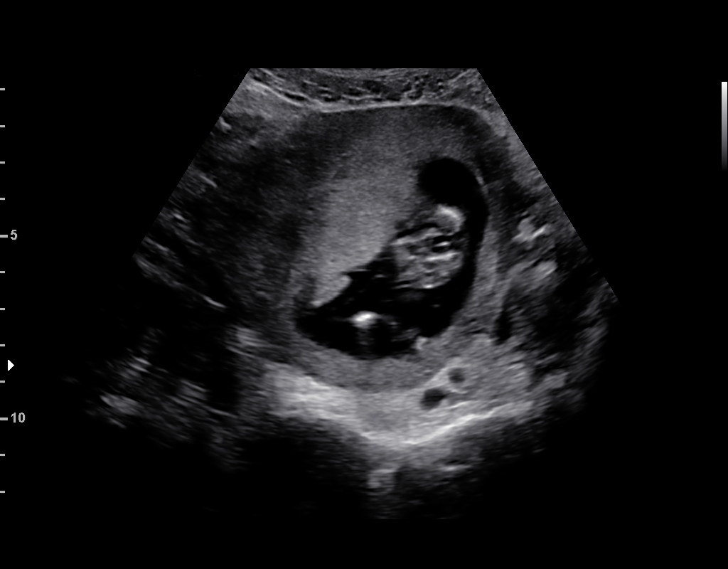
[im 12/34]
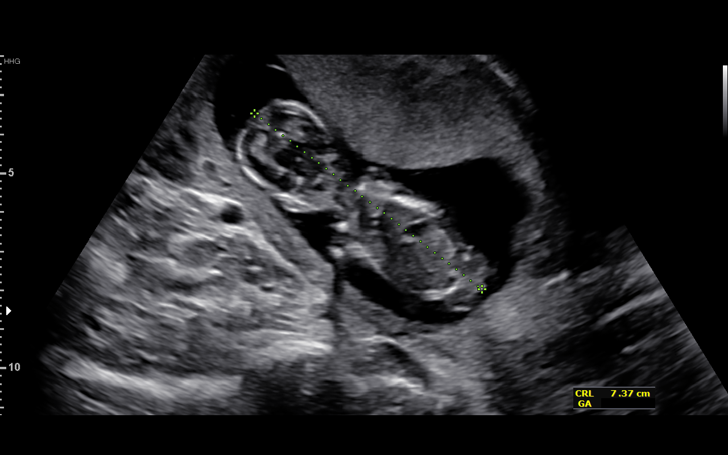
[im 14/34]
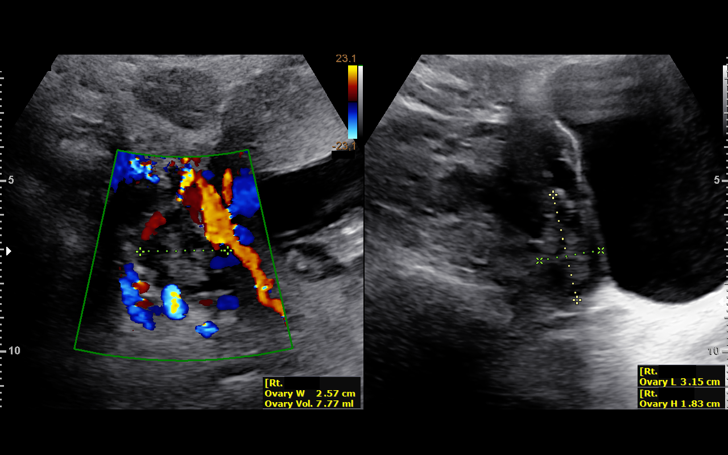
[im 16/34]
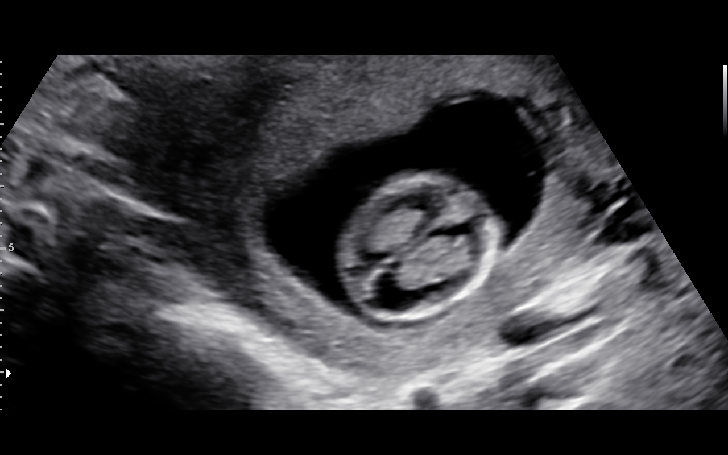
[im 19/34]
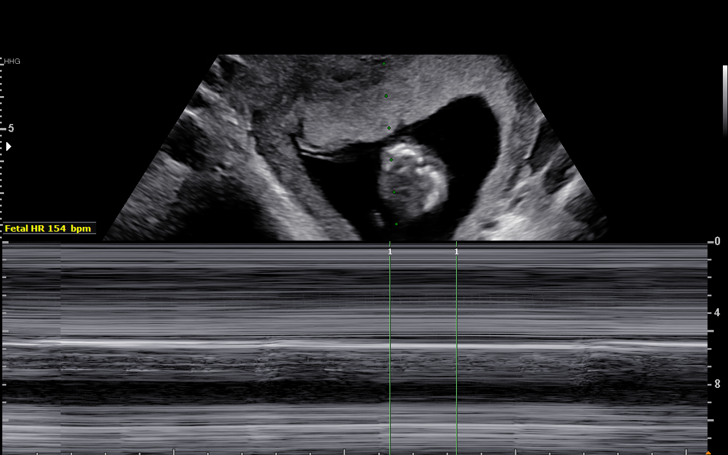
[im 21/34]
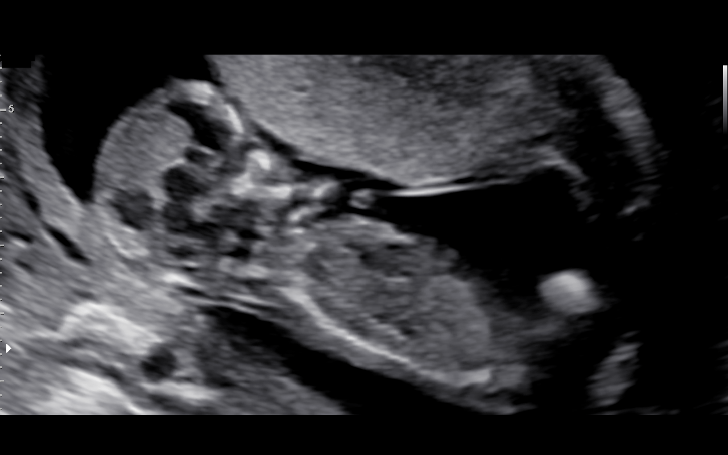
[im 24/34]
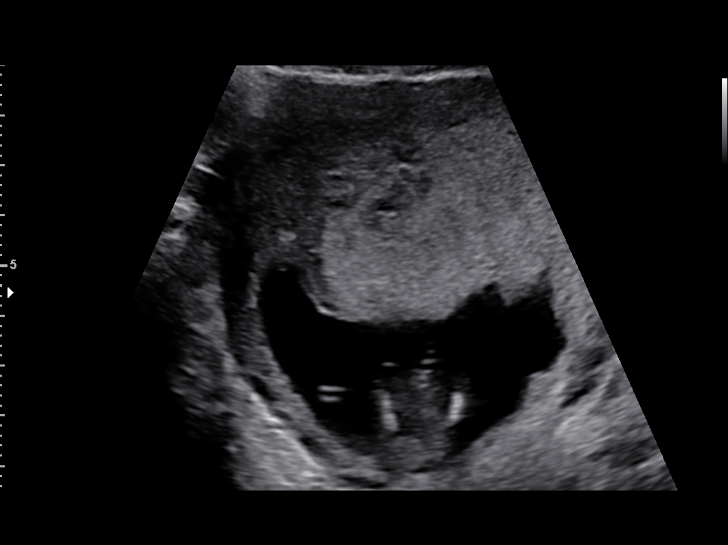
[im 26/34]
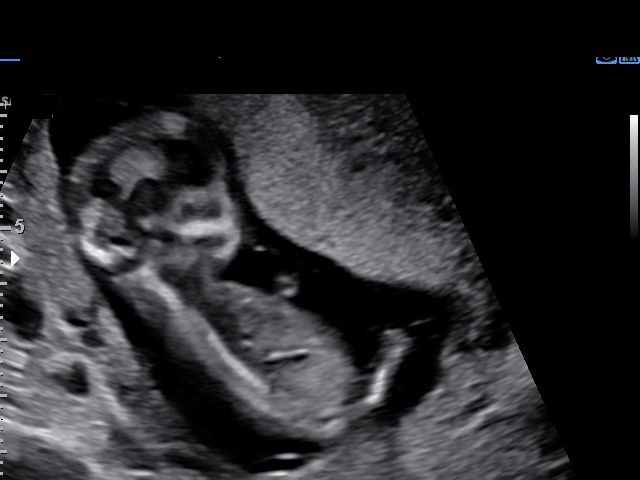
[im 29/34]
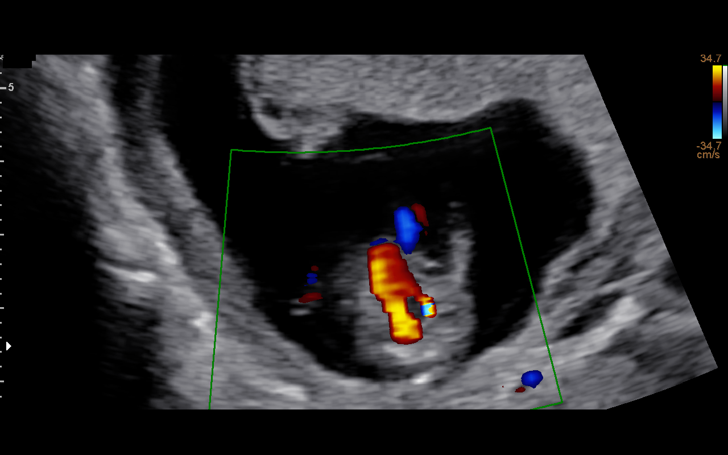
[im 31/34]
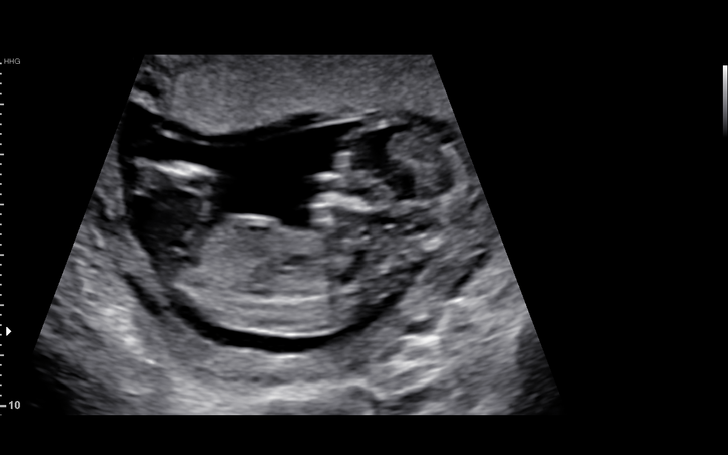
[im 34/34]
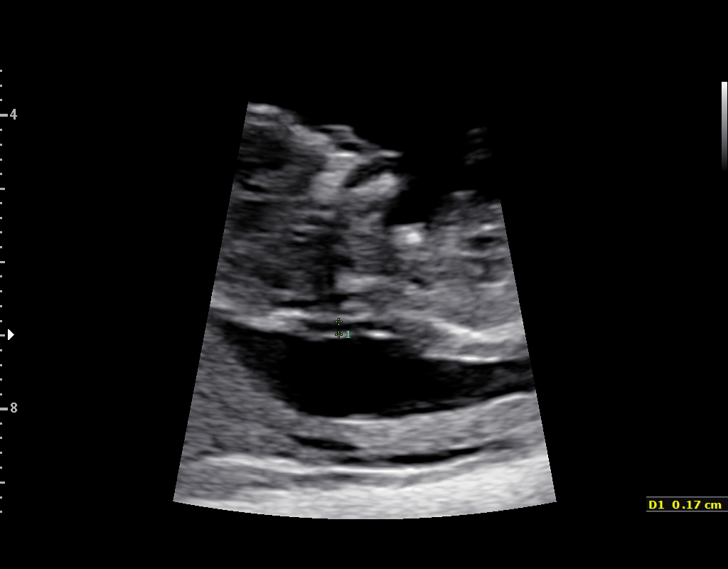

[14 of 28 positions shown; findings below may reference images not displayed]

TRANSLUCENCY

1  TJIVIKUA JOHANNESS            [id number]      [id number]     [id number]
Indications

13 weeks gestation of pregnancy
Encounter for nuchal translucency
OB History

Blood Type:            Height:  5'0"   Weight (lb):  115      BMI:
Gravidity:    2         Term:   0        Prem:   0        SAB:   1
TOP:          0       Ectopic:  0        Living: 0
Fetal Evaluation

Num Of Fetuses:     1
Fetal Heart         154
Rate(bpm):
Cardiac Activity:   Observed
Presentation:       Variable
Placenta:           Anterior, above cervical os

Amniotic Fluid
AFI FV:      Subjectively within normal limits
Biometry

CRL:      73.5  mm     G. Age:  13w 1d                  EDD:   03/18/17
NT:       1.16  mm
Gestational Age

LMP:           13w 3d       Date:   06/09/16                 EDD:   03/16/17
Best:          13w 3d    Det. By:   LMP  (06/09/16)          EDD:   03/16/17
1st Trimester Genetic Sonogram Screening
CRL:            73.5  mm    G. Age:   13w 1d                 EDD:   03/18/17
Nuc Trans:       1.7  mm

Nasal Bone:                 Present
Anatomy

Cranium:               Appears normal         Stomach:                Appears normal, left
sided
Cavum:                 Appears normal         Cord Vessels:           Appears normal (3
vessel cord)
Ventricles:            Appears normal         Upper Extremities:      Visualized
Choroid Plexus:        Appears normal         Lower Extremities:      Visualized
Heart:                 Appears normal
(axis, and situs

Other:  Nasal bone visualized. Technically difficult due to fetal position.
Cervix Uterus Adnexa

Left Ovary
Not visualized.

Right Ovary
Size(cm)     3.15  x    2.57   x  1.83      Vol(ml):
Within normal limits.

Adnexa:       No adnexal mass visualized.
Impression

Single IUP at 13w 3d
Normal NT (1.3 mm).  Nasal bone visualized.
First trimester aneuploidy screen performed as noted above.
Please do not draw triple/quad screen, though patient should
be offered MSAFP for neural tube defect screening.
Recommendations

Recommend ultrasound for fetal anatomy at 18-20 weeks

## 2017-01-11 ENCOUNTER — Ambulatory Visit (INDEPENDENT_AMBULATORY_CARE_PROVIDER_SITE_OTHER): Payer: BC Managed Care – PPO | Admitting: Family Medicine

## 2017-01-11 DIAGNOSIS — Z3403 Encounter for supervision of normal first pregnancy, third trimester: Secondary | ICD-10-CM

## 2017-01-11 NOTE — Progress Notes (Addendum)
Veronica RasSydney Moore is a 21 y.o. G2P0010 at 5040w6d here for routine follow up.  She reports she is overall doing well. Denies cramping/ctx, fluid leaking, vaginal bleeding, or decreased fetal movement.    Was instructed to take iron, though the liquid that was prescribed was too expensive. She bought an over the counter iron supplement, believes it is ferrous sulfate, taking 1 tab daily (300mg  approximately) - she will bring the bottle with her to her next visit  Has GERD - well controlled with as needed tums and pepcid.  Complains of feet swelling. Drinking plenty of water. No headache or abdominal pain.  Also complains of of "lightning crotch" - states she has googled this - no skin changes but has pain in pubic symphysis area  Infant feeding choice: breast Contraception choice: mirena Infant circumcision desired: yes - wants outpatient circumcision Pediatrician: has not decided for sure, thinks she will come here  Up to date on flu and Tdap  FHR 150bpm FH 30cm See flow sheet for details.  A/P: Pregnancy at 3340w6d.  Doing well.  Minimal swelling on exam. Blood pressure normal. Encouraged to keep drinking water, try support hose, elevate feet.  Pregnancy issues include: - substance abuse/psychiatric history - continues to manage well while off medications. Previously was on abilify and paxil prior to becoming pregnant. Discussed recommendation to re-establish with psychiatry prior to the end of her pregnancy. No SI/HI now. Given phone # for outpatient behavioral health clinic to call and get an appointment. Patient reports she feels safe and supported. Needs CSW consult at delivery. - size date discrepancy - now resolved, normal growth on recent ultrasound - history of HSV - needs viral suppressive prophylaxis at 36 weeks - GBS in urine - needs intrapartum antibiotics  - GERD - encouraged her to try switching to ranitidine as there is more data for this in pregnancy than pepcid, though all H2  blockers likely safe - mild anemia - encouraged to bring in her iron supplement so we can be sure of dose she is taking - symphysis pubis pain - encouraged tylenol for analgesia, consider pelvic support belt, can look online for exercises to strengthen pelvic muscles  Preterm labor and fetal movement precautions reviewed.  Follow up 2 weeks.

## 2017-01-11 NOTE — Addendum Note (Signed)
Addended by: Latrelle DodrillMCINTYRE, BRITTANY J on: 01/11/2017 04:55 PM   Modules accepted: Orders

## 2017-01-11 NOTE — Patient Instructions (Addendum)
If you have any cramping/contractions, vaginal bleeding, fluid leaking, or are worried that baby is not moving normally, go immediately to [facility] to be evaluated.   Call the [facility] center to schedule an appointment. Their phone number is: [phone].     Third Trimester of Pregnancy The third trimester is from week 29 through week 40 (months 7 through 9). The third trimester is a time when the unborn baby (fetus) is growing rapidly. At the end of the ninth month, the fetus is about 20 inches in length and weighs 6-10 pounds. Body changes during your third trimester Your body goes through many changes during pregnancy. The changes vary from woman to woman. During the third trimester:  Your weight will continue to increase. You can expect to gain 25-35 pounds (11-16 kg) by the end of the pregnancy.  You may begin to get stretch marks on your hips, abdomen, and breasts.  You may urinate more often because the fetus is moving lower into your pelvis and pressing on your bladder.  You may develop or continue to have heartburn. This is caused by increased hormones that slow down muscles in the digestive tract.  You may develop or continue to have constipation because increased hormones slow digestion and cause the muscles that push waste through your intestines to relax.  You may develop hemorrhoids. These are swollen veins (varicose veins) in the rectum that can itch or be painful.  You may develop swollen, bulging veins (varicose veins) in your legs.  You may have increased body aches in the pelvis, back, or thighs. This is due to weight gain and increased hormones that are relaxing your joints.  You may have changes in your hair. These can include thickening of your hair, rapid growth, and changes in texture. Some women also have hair loss during or after pregnancy, or hair that feels dry or thin. Your hair will most likely return to normal after your baby is  born.  Your breasts will continue to grow and they will continue to become tender. A yellow fluid (colostrum) may leak from your breasts. This is the first milk you are producing for your baby.  Your belly button may stick out.  You may notice more swelling in your hands, face, or ankles.  You may have increased tingling or numbness in your hands, arms, and legs. The skin on your belly may also feel numb.  You may feel short of breath because of your expanding uterus.  You may have more problems sleeping. This can be caused by the size of your belly, increased need to urinate, and an increase in your body's metabolism.  You may notice the fetus "dropping," or moving lower in your abdomen.  You may have increased vaginal discharge.  Your cervix becomes thin and soft (effaced) near your due date. What to expect at prenatal visits You will have prenatal exams every 2 weeks until week 36. Then you will have weekly prenatal exams. During a routine prenatal visit:  You will be weighed to make sure you and the fetus are growing normally.  Your blood pressure will be taken.  Your abdomen will be measured to track your baby's growth.  The fetal heartbeat will be listened to.  Any test results from the previous visit will be discussed.  You may have a cervical check near your due date to see if you have effaced. At around 36 weeks, your health care provider will check your cervix. At the same time,  your health care provider will also perform a test on the secretions of the vaginal tissue. This test is to determine if a type of bacteria, Group B streptococcus, is present. Your health care provider will explain this further. Your health care provider may ask you:  What your birth plan is.  How you are feeling.  If you are feeling the baby move.  If you have had any abnormal symptoms, such as leaking fluid, bleeding, severe headaches, or abdominal cramping.  If you are using any  tobacco products, including cigarettes, chewing tobacco, and electronic cigarettes.  If you have any questions. Other tests or screenings that may be performed during your third trimester include:  Blood tests that check for low iron levels (anemia).  Fetal testing to check the health, activity level, and growth of the fetus. Testing is done if you have certain medical conditions or if there are problems during the pregnancy.  Nonstress test (NST). This test checks the health of your baby to make sure there are no signs of problems, such as the baby not getting enough oxygen. During this test, a belt is placed around your belly. The baby is made to move, and its heart rate is monitored during movement. What is false labor? False labor is a condition in which you feel small, irregular tightenings of the muscles in the womb (contractions) that eventually go away. These are called Braxton Hicks contractions. Contractions may last for hours, days, or even weeks before true labor sets in. If contractions come at regular intervals, become more frequent, increase in intensity, or become painful, you should see your health care provider. What are the signs of labor?  Abdominal cramps.  Regular contractions that start at 10 minutes apart and become stronger and more frequent with time.  Contractions that start on the top of the uterus and spread down to the lower abdomen and back.  Increased pelvic pressure and dull back pain.  A watery or bloody mucus discharge that comes from the vagina.  Leaking of amniotic fluid. This is also known as your "water breaking." It could be a slow trickle or a gush. Let your doctor know if it has a color or strange odor. If you have any of these signs, call your health care provider right away, even if it is before your due date. Follow these instructions at home: Eating and drinking  Continue to eat regular, healthy meals.  Do not eat:  Raw meat or meat  spreads.  Unpasteurized milk or cheese.  Unpasteurized juice.  Store-made salad.  Refrigerated smoked seafood.  Hot dogs or deli meat, unless they are piping hot.  More than 6 ounces of albacore tuna a week.  Shark, swordfish, king mackerel, or tile fish.  Store-made salads.  Raw sprouts, such as mung bean or alfalfa sprouts.  Take prenatal vitamins as told by your health care provider.  Take 1000 mg of calcium daily as told by your health care provider.  If you develop constipation:  Take over-the-counter or prescription medicines.  Drink enough fluid to keep your urine clear or pale yellow.  Eat foods that are high in fiber, such as fresh fruits and vegetables, whole grains, and beans.  Limit foods that are high in fat and processed sugars, such as fried and sweet foods. Activity  Exercise only as directed by your health care provider. Healthy pregnant women should aim for 2 hours and 30 minutes of moderate exercise per week. If you experience any pain  or discomfort while exercising, stop.  Avoid heavy lifting.  Do not exercise in extreme heat or humidity, or at high altitudes.  Wear low-heel, comfortable shoes.  Practice good posture.  Do not travel far distances unless it is absolutely necessary and only with the approval of your health care provider.  Wear your seat belt at all times while in a car, on a bus, or on a plane.  Take frequent breaks and rest with your legs elevated if you have leg cramps or low back pain.  Do not use hot tubs, steam rooms, or saunas.  You may continue to have sex unless your health care provider tells you otherwise. Lifestyle  Do not use any products that contain nicotine or tobacco, such as cigarettes and e-cigarettes. If you need help quitting, ask your health care provider.  Do not drink alcohol.  Do not use any medicinal herbs or unprescribed drugs. These chemicals affect the formation and growth of the baby.  If you  develop varicose veins:  Wear support pantyhose or compression stockings as told by your healthcare provider.  Elevate your feet for 15 minutes, 3-4 times a day.  Wear a supportive maternity bra to help with breast tenderness. General instructions  Take over-the-counter and prescription medicines only as told by your health care provider. There are medicines that are either safe or unsafe to take during pregnancy.  Take warm sitz baths to soothe any pain or discomfort caused by hemorrhoids. Use hemorrhoid cream or witch hazel if your health care provider approves.  Avoid cat litter boxes and soil used by cats. These carry germs that can cause birth defects in the baby. If you have a cat, ask someone to clean the litter box for you.  To prepare for the arrival of your baby:  Take prenatal classes to understand, practice, and ask questions about the labor and delivery.  Make a trial run to the hospital.  Visit the hospital and tour the maternity area.  Arrange for maternity or paternity leave through employers.  Arrange for family and friends to take care of pets while you are in the hospital.  Purchase a rear-facing car seat and make sure you know how to install it in your car.  Pack your hospital bag.  Prepare the baby's nursery. Make sure to remove all pillows and stuffed animals from the baby's crib to prevent suffocation.  Visit your dentist if you have not gone during your pregnancy. Use a soft toothbrush to brush your teeth and be gentle when you floss.  Keep all prenatal follow-up visits as told by your health care provider. This is important. Contact a health care provider if:  You are unsure if you are in labor or if your water has broken.  You become dizzy.  You have mild pelvic cramps, pelvic pressure, or nagging pain in your abdominal area.  You have lower back pain.  You have persistent nausea, vomiting, or diarrhea.  You have an unusual or bad smelling  vaginal discharge.  You have pain when you urinate. Get help right away if:  You have a fever.  You are leaking fluid from your vagina.  You have spotting or bleeding from your vagina.  You have severe abdominal pain or cramping.  You have rapid weight loss or weight gain.  You have shortness of breath with chest pain.  You notice sudden or extreme swelling of your face, hands, ankles, feet, or legs.  Your baby makes fewer than 10 movements  in 2 hours.  You have severe headaches that do not go away with medicine.  You have vision changes. Summary  The third trimester is from week 29 through week 40, months 7 through 9. The third trimester is a time when the unborn baby (fetus) is growing rapidly.  During the third trimester, your discomfort may increase as you and your baby continue to gain weight. You may have abdominal, leg, and back pain, sleeping problems, and an increased need to urinate.  During the third trimester your breasts will keep growing and they will continue to become tender. A yellow fluid (colostrum) may leak from your breasts. This is the first milk you are producing for your baby.  False labor is a condition in which you feel small, irregular tightenings of the muscles in the womb (contractions) that eventually go away. These are called Braxton Hicks contractions. Contractions may last for hours, days, or even weeks before true labor sets in.  Signs of labor can include: abdominal cramps; regular contractions that start at 10 minutes apart and become stronger and more frequent with time; watery or bloody mucus discharge that comes from the vagina; increased pelvic pressure and dull back pain; and leaking of amniotic fluid. This information is not intended to replace advice given to you by your health care provider. Make sure you discuss any questions you have with your health care provider. Document Released: 11/10/2001 Document Revised: 04/23/2016 Document  Reviewed: 01/17/2013 Elsevier Interactive Patient Education  2017 [employer]    Heartburn During Pregnancy Heartburn is a type of pain or discomfort that can happen in the throat or chest. It is often described as a burning sensation. Heartburn is common during pregnancy because:  A hormone (progesterone) that is released during pregnancy may relax the valve (lower esophageal sphincter, or LES) that separates the esophagus from the stomach. This allows stomach acid to move up into the esophagus, causing heartburn.  The uterus gets larger and pushes up on the stomach, which pushes more acid into the esophagus. This is especially true in the later stages of pregnancy. Heartburn usually goes away or gets better after giving birth. What are the causes? Heartburn is caused by stomach acid backing up into the esophagus (reflux). Reflux can be triggered by:  Changing hormone levels.  Large meals.  Certain foods and beverages, such as coffee, chocolate, onions, and peppermint.  Exercise.  Increased stomach acid production. What increases the risk? You are more likely to experience heartburn during pregnancy if you:  Had heartburn prior to becoming pregnant.  Have been pregnant more than once before.  Are overweight or obese. The likelihood that you will get heartburn also increases as you get farther along in your pregnancy, especially during the last trimester. What are the signs or symptoms? Symptoms of this condition include:  Burning pain in the chest or lower throat.  Bitter taste in the mouth.  Coughing.  Problems swallowing.  Vomiting.  Hoarse voice.  Asthma. Symptoms may get worse when you lie down or bend over. Symptoms are often worse at night. How is this diagnosed? This condition is diagnosed based on:  Your medical history.  Your symptoms.  Blood tests to check for a certain type of bacteria associated with heartburn.  Whether taking heartburn  medicine relieves your symptoms.  Examination of the stomach and esophagus using a tube with a light and camera on the end (endoscopy). How is this treated? Treatment varies depending on how severe your symptoms are.  Your health care provider may recommend:  Over-the-counter medicines (antacids or acid reducers) for mild heartburn.  Prescription medicines to decrease stomach acid or to protect your stomach lining.  Certain changes in your diet.  Raising the head of your bed so it is higher than the foot of the bed. This helps prevent stomach acid from backing up into the esophagus when you are lying down. Follow these instructions at home: Eating and drinking  Do not drink alcohol during your pregnancy.  Identify foods and beverages that make your symptoms worse, and avoid them.  Beverages that you may want to avoid include:  Coffee and tea (with or without caffeine).  Energy drinks and sports drinks.  Carbonated drinks or sodas.  Citrus fruit juices.  Foods that you may want to avoid include:  Chocolate and cocoa.  Peppermint and mint flavorings.  Garlic, onions, and horseradish.  Spicy and acidic foods, including peppers, chili powder, curry powder, vinegar, hot sauces, and barbecue sauce.  Citrus fruits, such as oranges, lemons, and limes.  Tomato-based foods, such as red sauce, chili, and salsa.  Fried and fatty foods, such as donuts, french fries, potato chips, and high-fat dressings.  High-fat meats, such as hot dogs, cold cuts, sausage, ham, and bacon.  High-fat dairy items, such as whole milk, butter, and cheese.  Eat small, frequent meals instead of large meals.  Avoid drinking large amounts of liquid with your meals.  Avoid eating meals during the 2-3 hours before bedtime.  Avoid lying down right after you eat.  Do not exercise right after you eat. Medicines  Take over-the-counter and prescription medicines only as told by your health care  provider.  Do not take aspirin, ibuprofen, or other NSAIDs unless your health care provider tells you to do that.  You may be instructed to avoid medicines that contain sodium bicarbonate. General instructions  If directed, raise the head of your bed about 6 inches (15 cm) by putting blocks under the legs. Sleeping with more pillows does not effectively relieve heartburn because it only changes the position of your head.  Do not use any products that contain nicotine or tobacco, such as cigarettes and e-cigarettes. If you need help quitting, ask your health care provider.  Wear loose-fitting clothing.  Try to reduce your stress, such as with yoga or meditation. If you need help managing stress, ask your health care provider.  Maintain a healthy weight. If you are overweight, work with your health care provider to safely lose weight.  Keep all follow-up visits as told by your health care provider. This is important. Contact a health care provider if:  You develop new symptoms.  Your symptoms do not improve with treatment.  You have unexplained weight loss.  You have difficulty swallowing.  You make loud sounds when you breathe (wheeze).  You have a cough that does not go away.  You have frequent heartburn for more than 2 weeks.  You have nausea or vomiting that does not get better with treatment.  You have pain in your abdomen. Get help right away if:  You have severe chest pain that spreads to your arm, neck, or jaw.  You feel sweaty, dizzy, or light-headed.  You have shortness of breath.  You have pain when swallowing.  You vomit, and your vomit looks like blood or coffee grounds.  Your stool is bloody or black. This information is not intended to replace advice given to you by your health care provider. Make sure  you discuss any questions you have with your health care provider. Document Released: 11/13/2000 Document Revised: 08/03/2016 Document Reviewed:  08/03/2016 Elsevier Interactive Patient Education  2017 [employer].

## 2017-01-25 ENCOUNTER — Other Ambulatory Visit (HOSPITAL_COMMUNITY)
Admission: RE | Admit: 2017-01-25 | Discharge: 2017-01-25 | Disposition: A | Discharge status: Home / Self Care | Payer: BC Managed Care – PPO | Source: Ambulatory Visit | Attending: Family Medicine | Admitting: Family Medicine

## 2017-01-25 ENCOUNTER — Ambulatory Visit (INDEPENDENT_AMBULATORY_CARE_PROVIDER_SITE_OTHER): Payer: BC Managed Care – PPO | Admitting: Family Medicine

## 2017-01-25 VITALS — BP 124/76 | HR 99 | Temp 98.5°F | Wt 142.0 lb

## 2017-01-25 DIAGNOSIS — Z3493 Encounter for supervision of normal pregnancy, unspecified, third trimester: Secondary | ICD-10-CM

## 2017-01-25 DIAGNOSIS — O26893 Other specified pregnancy related conditions, third trimester: Secondary | ICD-10-CM

## 2017-01-25 DIAGNOSIS — Z113 Encounter for screening for infections with a predominantly sexual mode of transmission: Secondary | ICD-10-CM | POA: Diagnosis not present

## 2017-01-25 DIAGNOSIS — N898 Other specified noninflammatory disorders of vagina: Secondary | ICD-10-CM | POA: Diagnosis not present

## 2017-01-25 LAB — POCT URINALYSIS DIPSTICK
Bilirubin, UA: NEGATIVE
Blood, UA: NEGATIVE
Glucose, UA: NEGATIVE
Ketones, UA: NEGATIVE
Leukocytes, UA: NEGATIVE
Nitrite, UA: NEGATIVE
Protein, UA: NEGATIVE
Spec Grav, UA: 1.025
Urobilinogen, UA: 0.2
pH, UA: 6.5

## 2017-01-25 LAB — POCT WET PREP (WET MOUNT)
Clue Cells Wet Prep Whiff POC: NEGATIVE
Trichomonas Wet Prep HPF POC: ABSENT

## 2017-01-25 NOTE — Patient Instructions (Signed)
If you have any contractions which occur 7-10 minutes apart, vaginal bleeding, fluid leaking, or are worried that baby is not moving well, go immediately to [facility] to be evaluated.   Testing for infections -will call you with results Next visit in 2 weeks with Dr. Ottie Glazier  Be well, Dr. Pollie Meyer   Third Trimester of Pregnancy The third trimester is from week 29 through week 40 (months 7 through 9). The third trimester is a time when the unborn baby (fetus) is growing rapidly. At the end of the ninth month, the fetus is about 20 inches in length and weighs 6-10 pounds. Body changes during your third trimester Your body goes through many changes during pregnancy. The changes vary from woman to woman. During the third trimester:  Your weight will continue to increase. You can expect to gain 25-35 pounds (11-16 kg) by the end of the pregnancy.  You may begin to get stretch marks on your hips, abdomen, and breasts.  You may urinate more often because the fetus is moving lower into your pelvis and pressing on your bladder.  You may develop or continue to have heartburn. This is caused by increased hormones that slow down muscles in the digestive tract.  You may develop or continue to have constipation because increased hormones slow digestion and cause the muscles that push waste through your intestines to relax.  You may develop hemorrhoids. These are swollen veins (varicose veins) in the rectum that can itch or be painful.  You may develop swollen, bulging veins (varicose veins) in your legs.  You may have increased body aches in the pelvis, back, or thighs. This is due to weight gain and increased hormones that are relaxing your joints.  You may have changes in your hair. These can include thickening of your hair, rapid growth, and changes in texture. Some women also have hair loss during or after pregnancy, or hair that feels dry or thin. Your hair will most likely return to  normal after your baby is born.  Your breasts will continue to grow and they will continue to become tender. A yellow fluid (colostrum) may leak from your breasts. This is the first milk you are producing for your baby.  Your belly button may stick out.  You may notice more swelling in your hands, face, or ankles.  You may have increased tingling or numbness in your hands, arms, and legs. The skin on your belly may also feel numb.  You may feel short of breath because of your expanding uterus.  You may have more problems sleeping. This can be caused by the size of your belly, increased need to urinate, and an increase in your body's metabolism.  You may notice the fetus "dropping," or moving lower in your abdomen.  You may have increased vaginal discharge.  Your cervix becomes thin and soft (effaced) near your due date. What to expect at prenatal visits You will have prenatal exams every 2 weeks until week 36. Then you will have weekly prenatal exams. During a routine prenatal visit:  You will be weighed to make sure you and the fetus are growing normally.  Your blood pressure will be taken.  Your abdomen will be measured to track your baby's growth.  The fetal heartbeat will be listened to.  Any test results from the previous visit will be discussed.  You may have a cervical check near your due date to see if you have effaced. At around 36 weeks, your health  care provider will check your cervix. At the same time, your health care provider will also perform a test on the secretions of the vaginal tissue. This test is to determine if a type of bacteria, Group B streptococcus, is present. Your health care provider will explain this further. Your health care provider may ask you:  What your birth plan is.  How you are feeling.  If you are feeling the baby move.  If you have had any abnormal symptoms, such as leaking fluid, bleeding, severe headaches, or abdominal  cramping.  If you are using any tobacco products, including cigarettes, chewing tobacco, and electronic cigarettes.  If you have any questions. Other tests or screenings that may be performed during your third trimester include:  Blood tests that check for low iron levels (anemia).  Fetal testing to check the health, activity level, and growth of the fetus. Testing is done if you have certain medical conditions or if there are problems during the pregnancy.  Nonstress test (NST). This test checks the health of your baby to make sure there are no signs of problems, such as the baby not getting enough oxygen. During this test, a belt is placed around your belly. The baby is made to move, and its heart rate is monitored during movement. What is false labor? False labor is a condition in which you feel small, irregular tightenings of the muscles in the womb (contractions) that eventually go away. These are called Braxton Hicks contractions. Contractions may last for hours, days, or even weeks before true labor sets in. If contractions come at regular intervals, become more frequent, increase in intensity, or become painful, you should see your health care provider. What are the signs of labor?  Abdominal cramps.  Regular contractions that start at 10 minutes apart and become stronger and more frequent with time.  Contractions that start on the top of the uterus and spread down to the lower abdomen and back.  Increased pelvic pressure and dull back pain.  A watery or bloody mucus discharge that comes from the vagina.  Leaking of amniotic fluid. This is also known as your "water breaking." It could be a slow trickle or a gush. Let your doctor know if it has a color or strange odor. If you have any of these signs, call your health care provider right away, even if it is before your due date. Follow these instructions at home: Eating and drinking  Continue to eat regular, healthy meals.  Do  not eat:  Raw meat or meat spreads.  Unpasteurized milk or cheese.  Unpasteurized juice.  Store-made salad.  Refrigerated smoked seafood.  Hot dogs or deli meat, unless they are piping hot.  More than 6 ounces of albacore tuna a week.  Shark, swordfish, king mackerel, or tile fish.  Store-made salads.  Raw sprouts, such as mung bean or alfalfa sprouts.  Take prenatal vitamins as told by your health care provider.  Take 1000 mg of calcium daily as told by your health care provider.  If you develop constipation:  Take over-the-counter or prescription medicines.  Drink enough fluid to keep your urine clear or pale yellow.  Eat foods that are high in fiber, such as fresh fruits and vegetables, whole grains, and beans.  Limit foods that are high in fat and processed sugars, such as fried and sweet foods. Activity  Exercise only as directed by your health care provider. Healthy pregnant women should aim for 2 hours and 30 minutes  of moderate exercise per week. If you experience any pain or discomfort while exercising, stop.  Avoid heavy lifting.  Do not exercise in extreme heat or humidity, or at high altitudes.  Wear low-heel, comfortable shoes.  Practice good posture.  Do not travel far distances unless it is absolutely necessary and only with the approval of your health care provider.  Wear your seat belt at all times while in a car, on a bus, or on a plane.  Take frequent breaks and rest with your legs elevated if you have leg cramps or low back pain.  Do not use hot tubs, steam rooms, or saunas.  You may continue to have sex unless your health care provider tells you otherwise. Lifestyle  Do not use any products that contain nicotine or tobacco, such as cigarettes and e-cigarettes. If you need help quitting, ask your health care provider.  Do not drink alcohol.  Do not use any medicinal herbs or unprescribed drugs. These chemicals affect the formation and  growth of the baby.  If you develop varicose veins:  Wear support pantyhose or compression stockings as told by your healthcare provider.  Elevate your feet for 15 minutes, 3-4 times a day.  Wear a supportive maternity bra to help with breast tenderness. General instructions  Take over-the-counter and prescription medicines only as told by your health care provider. There are medicines that are either safe or unsafe to take during pregnancy.  Take warm sitz baths to soothe any pain or discomfort caused by hemorrhoids. Use hemorrhoid cream or witch hazel if your health care provider approves.  Avoid cat litter boxes and soil used by cats. These carry germs that can cause birth defects in the baby. If you have a cat, ask someone to clean the litter box for you.  To prepare for the arrival of your baby:  Take prenatal classes to understand, practice, and ask questions about the labor and delivery.  Make a trial run to the hospital.  Visit the hospital and tour the maternity area.  Arrange for maternity or paternity leave through employers.  Arrange for family and friends to take care of pets while you are in the hospital.  Purchase a rear-facing car seat and make sure you know how to install it in your car.  Pack your hospital bag.  Prepare the baby's nursery. Make sure to remove all pillows and stuffed animals from the baby's crib to prevent suffocation.  Visit your dentist if you have not gone during your pregnancy. Use a soft toothbrush to brush your teeth and be gentle when you floss.  Keep all prenatal follow-up visits as told by your health care provider. This is important. Contact a health care provider if:  You are unsure if you are in labor or if your water has broken.  You become dizzy.  You have mild pelvic cramps, pelvic pressure, or nagging pain in your abdominal area.  You have lower back pain.  You have persistent nausea, vomiting, or diarrhea.  You have  an unusual or bad smelling vaginal discharge.  You have pain when you urinate. Get help right away if:  You have a fever.  You are leaking fluid from your vagina.  You have spotting or bleeding from your vagina.  You have severe abdominal pain or cramping.  You have rapid weight loss or weight gain.  You have shortness of breath with chest pain.  You notice sudden or extreme swelling of your face, hands, ankles, feet,  or legs.  Your baby makes fewer than 10 movements in 2 hours.  You have severe headaches that do not go away with medicine.  You have vision changes. Summary  The third trimester is from week 29 through week 40, months 7 through 9. The third trimester is a time when the unborn baby (fetus) is growing rapidly.  During the third trimester, your discomfort may increase as you and your baby continue to gain weight. You may have abdominal, leg, and back pain, sleeping problems, and an increased need to urinate.  During the third trimester your breasts will keep growing and they will continue to become tender. A yellow fluid (colostrum) may leak from your breasts. This is the first milk you are producing for your baby.  False labor is a condition in which you feel small, irregular tightenings of the muscles in the womb (contractions) that eventually go away. These are called Braxton Hicks contractions. Contractions may last for hours, days, or even weeks before true labor sets in.  Signs of labor can include: abdominal cramps; regular contractions that start at 10 minutes apart and become stronger and more frequent with time; watery or bloody mucus discharge that comes from the vagina; increased pelvic pressure and dull back pain; and leaking of amniotic fluid. This information is not intended to replace advice given to you by your health care provider. Make sure you discuss any questions you have with your health care provider. Document Released: 11/10/2001 Document  Revised: 04/23/2016 Document Reviewed: 01/17/2013 Elsevier Interactive Patient Education  2017 [employer].

## 2017-01-25 NOTE — Progress Notes (Signed)
Veronica RasSydney Moore is a 21 y.o. G2P0010 at 958w6d here for routine follow up.  She reports she is doing well overall. Having lots of trouble at her job (is a Production assistant, radioserver at [employer]). Difficult for her to stand for that long, and has not been allowed to take breaks. Also very hard for her to carry all the food due to her growing belly and fatigue. Also complains of swelling in her feet when she stands all day.  She did confirm she is taking ferrous sulfate 325mg  one tablet daily.  Baby is moving well. No fluid leaking or vaginal bleeding. Does have a lot of mucousy white discharge. Has feeling of contractions that come and go, last anywhere from 30 mins to a few hours. Feels stomach tightening and pain in her back. It then subsides and she feels fine. See flow sheet for details.  FHR 147 bpm Fundal height 32cm  Normal external genitalia Cervix 2cm external os, closed internal Normal white discharge present  A/P: Pregnancy at 5058w6d.  Doing well.   Pregnancy issues include: - substance abuse/psychiatric history -  Needs CSW consult at delivery. Trying to get her in with psychiatry before delivery - did not discuss today as she had a friend present during the visit. 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 Check wet prep, urine, gc/chlamydia/trich today. 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 Encouraged her to get compression hose to help with swelling.  Preterm labor and fetal movement precautions reviewed. Follow up 2 weeks with Dr. Ottie GlazierGunadasa.  Levert FeinsteinBrittany McIntyre, MD [facility]

## 2017-01-26 LAB — CERVICOVAGINAL ANCILLARY ONLY
Chlamydia: NEGATIVE
Neisseria Gonorrhea: NEGATIVE
Trichomonas: NEGATIVE

## 2017-02-02 ENCOUNTER — Telehealth: Payer: Self-pay | Admitting: Internal Medicine

## 2017-02-02 NOTE — Telephone Encounter (Signed)
Clinical info completed on short term disability form.  Place form in Dr. Deland PrettyGunadasa's box for completion.  Feliz BeamHARTSELL,  JAZMIN, CMA

## 2017-02-02 NOTE — Telephone Encounter (Signed)
Work reasonable accommodation request leave form dropped off for at front desk for completion.  Verified that patient section of form has been completed.  Last DOS/WCC with PCP was 01/25/17  Placed form in blue team folder to be completed by clinical staff.  Lina Sarheryl A Stanley

## 2017-02-04 ENCOUNTER — Ambulatory Visit (HOSPITAL_COMMUNITY): Payer: Self-pay | Admitting: Psychiatry

## 2017-02-04 NOTE — Telephone Encounter (Signed)
Form completed and placed in Veronica Moore's box 

## 2017-02-05 NOTE — Telephone Encounter (Signed)
Patient informed that work/leave forms are completed and ready for pick up.  Forms copied for scanning in patient's record.  Clovis PuMartin, Tamika L, RN

## 2017-02-10 ENCOUNTER — Ambulatory Visit (INDEPENDENT_AMBULATORY_CARE_PROVIDER_SITE_OTHER): Payer: BC Managed Care – PPO | Admitting: Internal Medicine

## 2017-02-10 DIAGNOSIS — Z3483 Encounter for supervision of other normal pregnancy, third trimester: Secondary | ICD-10-CM

## 2017-02-10 NOTE — Patient Instructions (Signed)
Thank you for coming in! Please follow up in 1 week for your next OB visit.    Fetal Movement Counts Patient Name: ________________________________________________ Patient Due Date: ____________________ What is a fetal movement count? A fetal movement count is the number of times that you feel your baby move during a certain amount of time. This may also be called a fetal kick count. A fetal movement count is recommended for every pregnant woman. You may be asked to start counting fetal movements as early as week 28 of your pregnancy. Pay attention to when your baby is most active. You may notice your baby's sleep and wake cycles. You may also notice things that make your baby move more. You should do a fetal movement count:  When your baby is normally most active.  At the same time each day. A good time to count movements is while you are resting, after having something to eat and drink. How do I count fetal movements? 1. Find a quiet, comfortable area. Sit, or lie down on your side. 2. Write down the date, the start time and stop time, and the number of movements that you felt between those two times. Take this information with you to your health care visits. 3. For 2 hours, count kicks, flutters, swishes, rolls, and jabs. You should feel at least 10 movements during 2 hours. 4. You may stop counting after you have felt 10 movements. 5. If you do not feel 10 movements in 2 hours, have something to eat and drink. Then, keep resting and counting for 1 hour. If you feel at least 4 movements during that hour, you may stop counting. Contact a health care provider if:  You feel fewer than 4 movements in 2 hours.  Your baby is not moving like he or she usually does. Date: ____________ Start time: ____________ Stop time: ____________ Movements: ____________ Date: ____________ Start time: ____________ Stop time: ____________ Movements: ____________ Date: ____________ Start time: ____________ Stop  time: ____________ Movements: ____________ Date: ____________ Start time: ____________ Stop time: ____________ Movements: ____________ Date: ____________ Start time: ____________ Stop time: ____________ Movements: ____________ Date: ____________ Start time: ____________ Stop time: ____________ Movements: ____________ Date: ____________ Start time: ____________ Stop time: ____________ Movements: ____________ Date: ____________ Start time: ____________ Stop time: ____________ Movements: ____________ Date: ____________ Start time: ____________ Stop time: ____________ Movements: ____________ This information is not intended to replace advice given to you by your health care provider. Make sure you discuss any questions you have with your health care provider. Document Released: 12/16/2006 Document Revised: 07/15/2016 Document Reviewed: 12/26/2015 Elsevier Interactive Patient Education  2017 [employer]

## 2017-02-10 NOTE — Progress Notes (Signed)
Veronica Moore is a 21 y.o. G2P0010 at 4753w1d for routine follow up.  Patient has PMH of Bipolar DO/MDD (not on medications since finding out about pregnancy), history of drug use, GBS carrier (in urine), hx of genital HSV.  She reports of irregular pressure/contractions. Reports of good fetal movement. Denies any LOF or vaginal bleeding   - reports of episode of chest pressure (not pain) where she feels like she cannot catch her breath. This started this morning. It lasts less than 10 seconds. No nausea, diaphoresis. Occurs when she is at rest. Does not worsen with activity. No orthopnea or worsening lower extremity swelling. Reports of feet swelling which improves with elevation of legs. No calf pain. No cough, fevers, or chills.   FHR: 134bpm FH: 35cm  GEN: NAD  CV: RRR, no murmurs, rubs, or gallops, no JVD PULM: CTAB, normal effort ABD: Soft, nontender, gravid SKIN: No rash or cyanosis; warm and well-perfused EXTR: No lower extremity edema or calf tenderness PSYCH: Mood and affect euthymic, normal rate and volume of speech NEURO: Awake, alert, no focal deficits grossly, normal speech   See flow sheet for details.  A/P: Pregnancy at 6853w1d.  Doing well.   Pregnancy issues include: Shortness of breath Episodes: unclear in etiology. Considered PE but unlikely as symptoms are intermittent, normal pulse oximetry, no calf pain or swelling. No signs of heart failure on exam; unlikely CHF. Return/ED precautions discussed. Discussed with preceptor.   Bipolar DO/MDD (not on medications since finding out about pregnancy), history of drug use: will need SW consult after delivery  Hx Genital HSV: called patient after visit to inform that she will need HSV prophylaxis at 36 weeks. Rx sent to pharmacy. Patient to start at 36 weeks which will be 02/16/17. - Acyclovir 400mg  TID starting at [redacted] weeks GA  Infant feeding choice: breast feed mostly, possibly formula  Contraception choice: mirena [facility]  Tdapwas not given today. Received in January 2018 GBS/GC/CZ testing was not performed today. Already performed at last visit.  GBS testing not requiring as patient is already indicated for intrapartum antibiotics as her urine culture grew GBS in first trimester.   Preterm labor precautions reviewed. Safe sleep discussed. Kick counts reviewed. Follow up 1 week.

## 2017-02-11 MED ORDER — ACYCLOVIR 400 MG PO TABS: 400.0000 mg | ORAL_TABLET | Freq: Three times a day (TID) | ORAL | 0 refills | Status: DC

## 2017-02-15 IMAGING — US US MFM OB COMP +14 WKS
2 series · 14 of 28 positions shown · non-contrast
Comparison: none

[Series 1: us mfm ob comp +14 wks · 11 of 53 slices shown (1 of 2)]
[im 3/53]
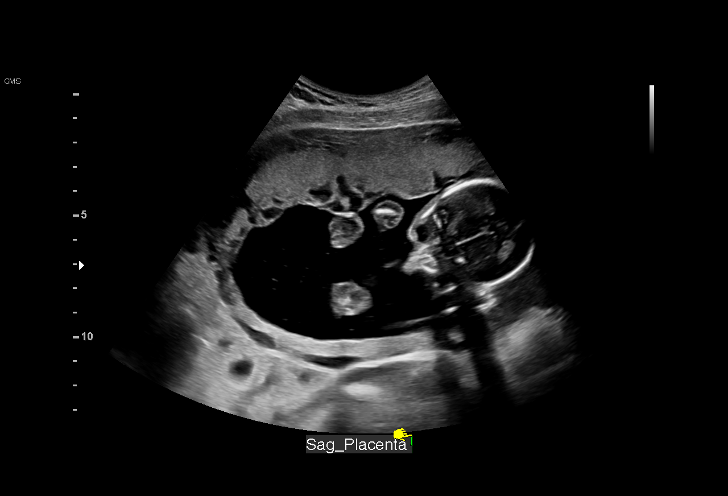
[im 8/53]
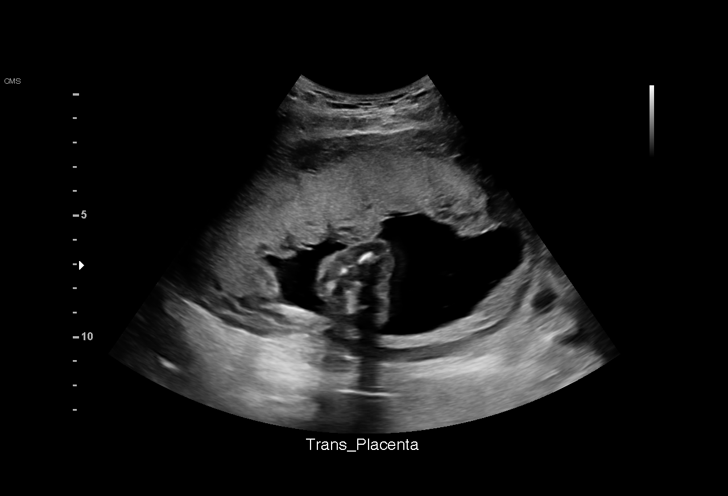
[im 13/53]
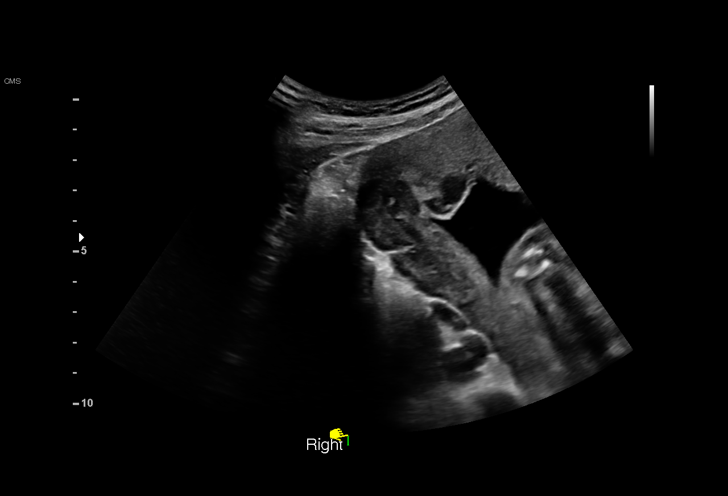
[im 18/53]
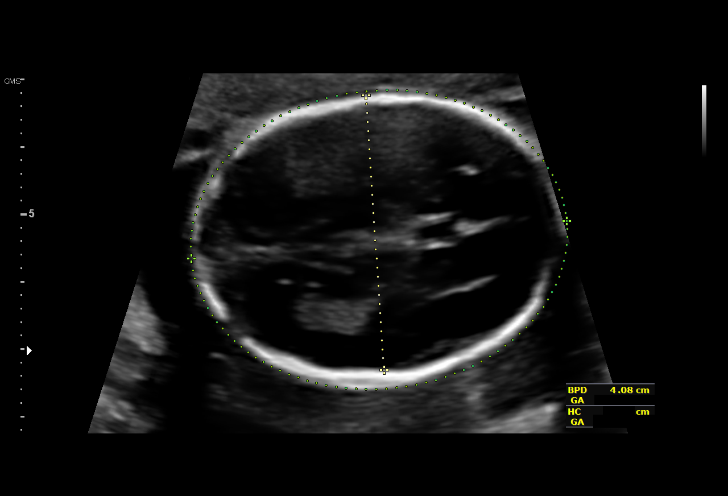
[im 23/53]
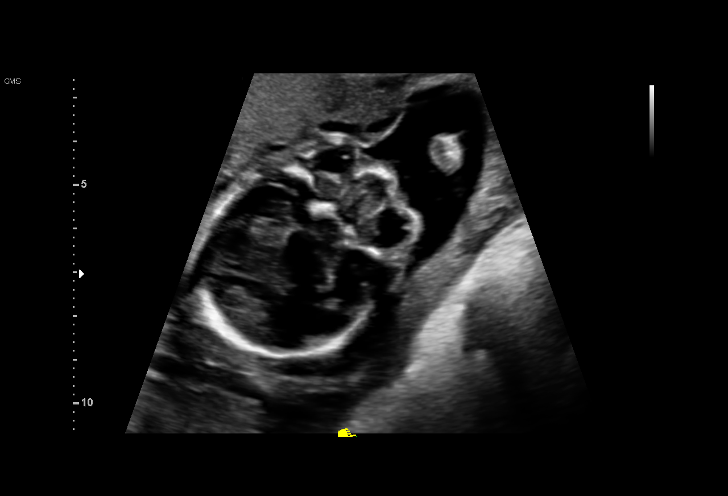
[im 28/53]
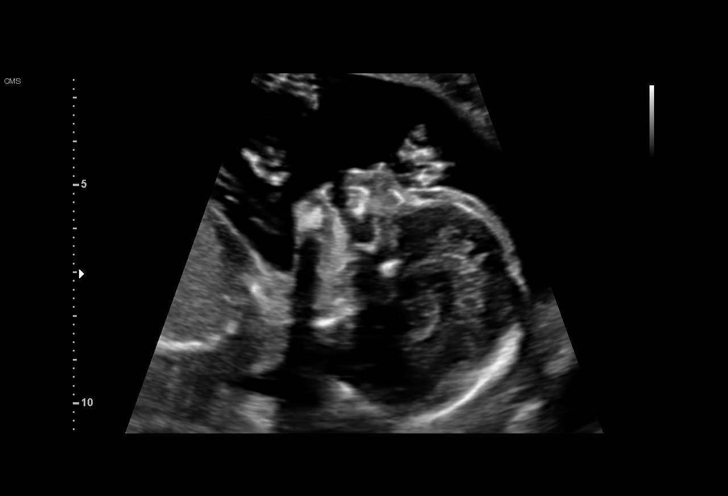
[im 33/53]
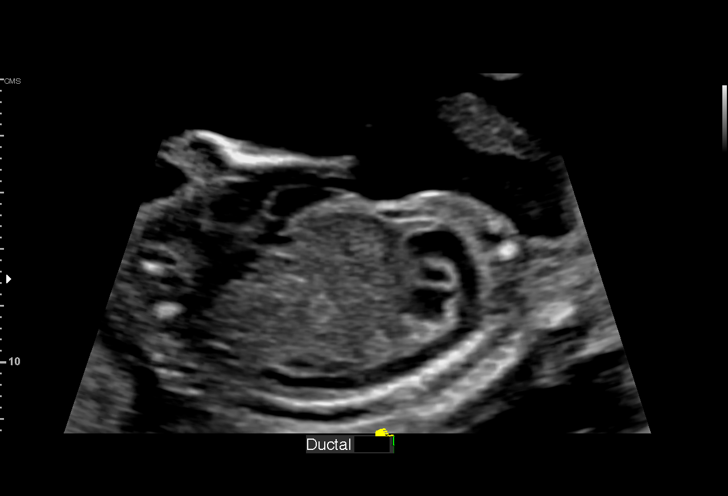
[im 38/53]
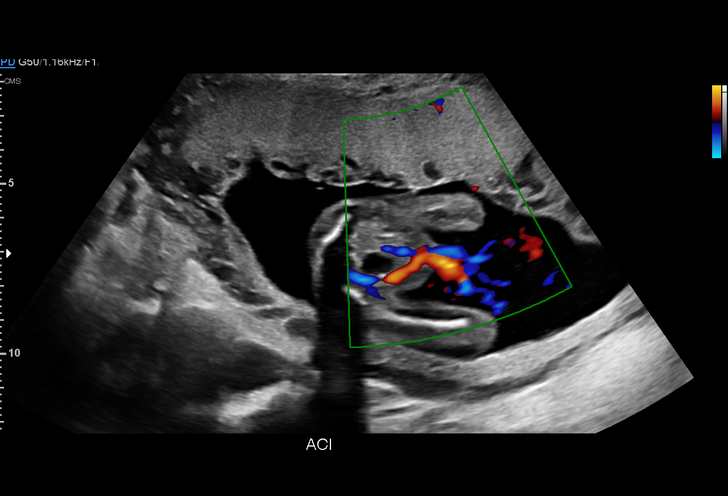
[im 43/53]
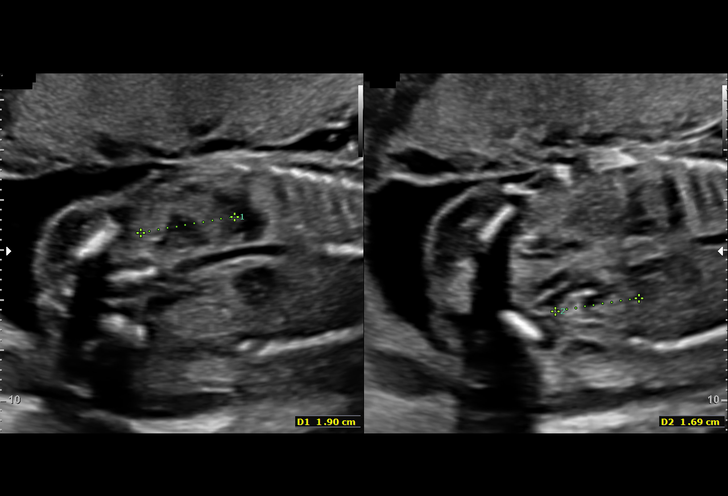
[im 48/53]
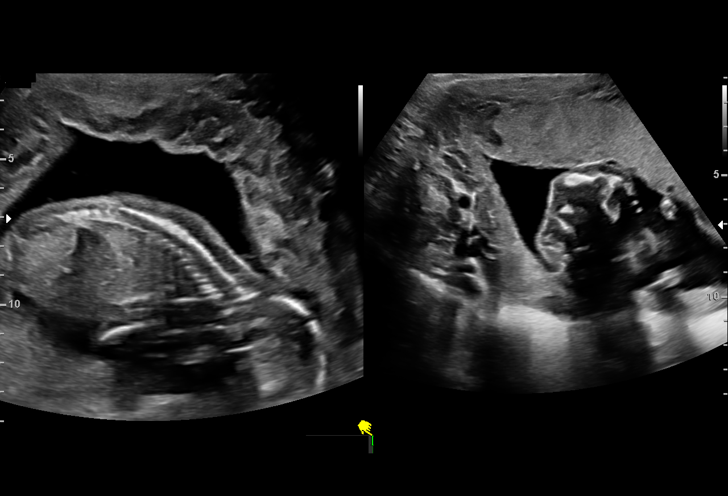
[im 53/53]
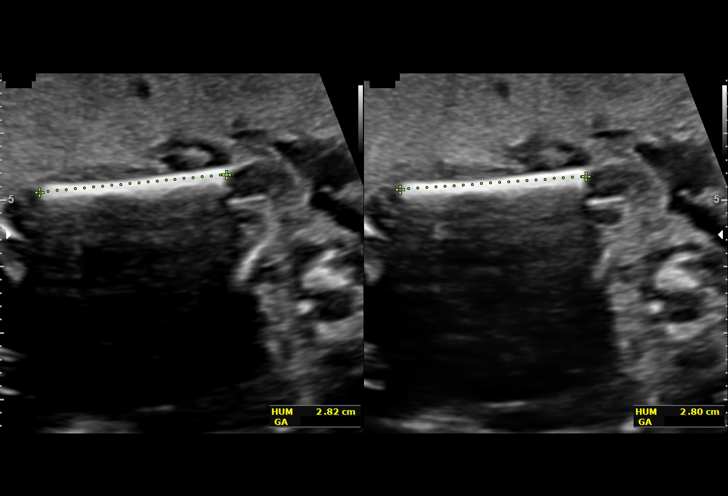

[Series 2: us mfm ob comp +14 wks · 3 of 13 slices shown (2 of 2)]
[im 3/13]
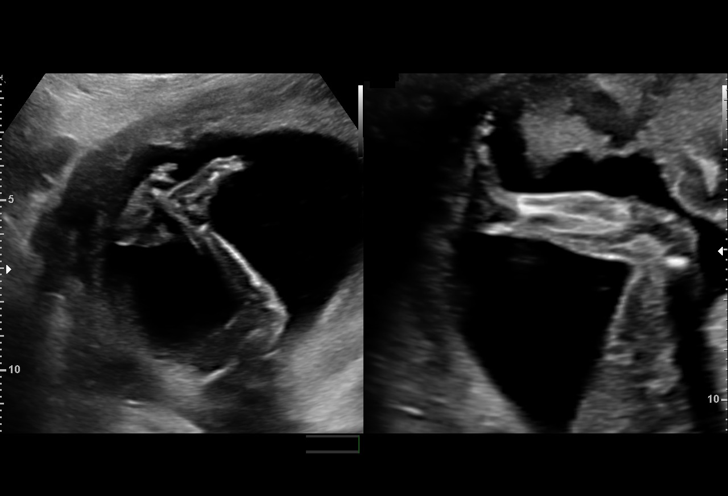
[im 8/13]
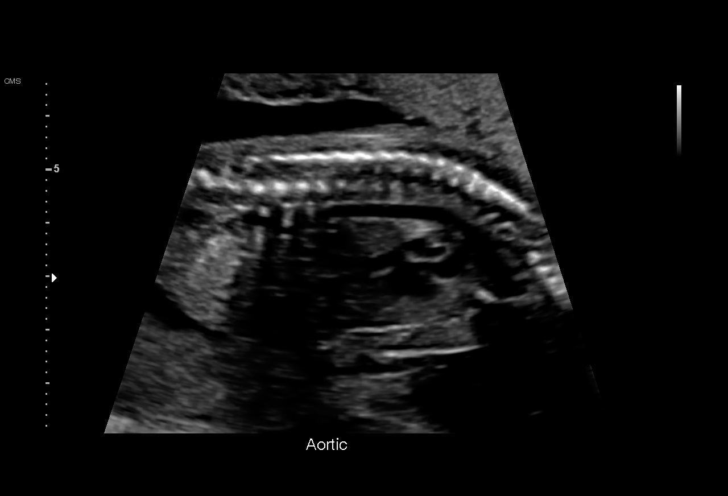
[im 13/13]
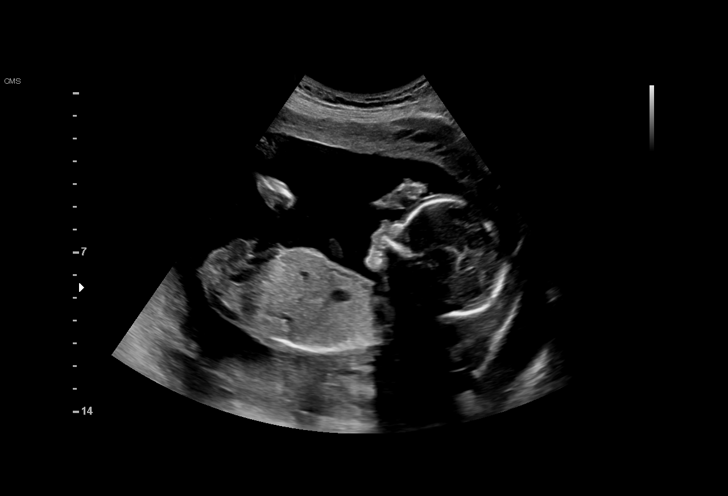

[14 of 28 positions shown; findings below may reference images not displayed]

1  HIRAMAN ORSU              [id number]      [id number]     [id number]
Indications

19 weeks gestation of pregnancy
Encounter for antenatal screening for
malformations
OB History

Blood Type:            Height:  5'0"   Weight (lb):  115      BMI:
Gravidity:    2         Term:   0        Prem:   0        SAB:   1
TOP:          0       Ectopic:  0        Living: 0
Fetal Evaluation

Num Of Fetuses:     1
Fetal Heart         138
Rate(bpm):
Cardiac Activity:   Observed
Presentation:       Cephalic
Placenta:           Anterior, above cervical os
P. Cord Insertion:  Visualized, central

Amniotic Fluid
AFI FV:      Subjectively within normal limits

Largest Pocket(cm)
4.21
Biometry

BPD:      41.3  mm     G. Age:  18w 3d         29  %    CI:        68.28   %   70 - 86
FL/HC:      17.8   %   16.1 -
HC:      159.8  mm     G. Age:  18w 6d         34  %    HC/AC:      1.06       1.09 -
AC:      150.5  mm     G. Age:  20w 2d         84  %    FL/BPD:     68.8   %
FL:       28.4  mm     G. Age:  18w 5d         33  %    FL/AC:      18.9   %   20 - 24
HUM:      28.1  mm     G. Age:  19w 0d         51  %
CER:      18.3  mm     G. Age:  18w 1d         25  %
NFT:       5.1  mm
CM:        5.2  mm

Est. FW:     293  gm    0 lb 10 oz      52  %
Gestational Age

LMP:           19w 0d       Date:   06/09/16                 EDD:   03/16/17
U/S Today:     19w 1d                                        EDD:   03/15/17
Best:          19w 0d    Det. By:   LMP  (06/09/16)          EDD:   03/16/17
Anatomy

Cranium:               Appears normal         Aortic Arch:            Appears normal
Cavum:                 Appears normal         Ductal Arch:            Appears normal
Ventricles:            Appears normal         Diaphragm:              Appears normal
Choroid Plexus:        Appears normal         Stomach:                Appears normal, left
sided
Cerebellum:            Appears normal         Abdomen:                Appears normal
Posterior Fossa:       Appears normal         Abdominal Wall:         Appears nml (cord
insert, abd wall)
Nuchal Fold:           Appears normal         Cord Vessels:           Appears normal (3
vessel cord)
Face:                  Appears normal         Kidneys:                Appear normal
(orbits and profile)
Lips:                  Appears normal         Bladder:                Appears normal
Thoracic:              Appears normal         Spine:                  Appears normal
Heart:                 Appears normal         Upper Extremities:      Appears normal
(4CH, axis, and
situs)
RVOT:                  Appears normal         Lower Extremities:      Appears normal
LVOT:                  Appears normal

Other:  Fetus appears to be a male. Heels visualized. Nasal bone visualized.
Cervix Uterus Adnexa

Cervix
Length:            3.6  cm.
Normal appearance by transabdominal scan.

Uterus
No abnormality visualized.

Left Ovary
Within normal limits.

Right Ovary
Not visualized.

Cul De Sac:   No free fluid seen.

Adnexa:       No abnormality visualized.
Impression

Singleton intrauterine pregnancy at 19+0 weeks
Review of the anatomy shows no sonographic markers for
aneuploidy or structural anomalies
Amniotic fluid volume is normal
Estimated fetal weight is 293g which is growth in the 52nd
percentile
Recommendations

Normal growth and development. Follow-up ultrasounds as
clinically indicated.

## 2017-02-17 ENCOUNTER — Other Ambulatory Visit (HOSPITAL_COMMUNITY)
Admission: RE | Admit: 2017-02-17 | Discharge: 2017-02-17 | Disposition: A | Discharge status: Home / Self Care | Payer: BC Managed Care – PPO | Source: Ambulatory Visit | Attending: Family Medicine | Admitting: Family Medicine

## 2017-02-17 ENCOUNTER — Ambulatory Visit (INDEPENDENT_AMBULATORY_CARE_PROVIDER_SITE_OTHER): Payer: BC Managed Care – PPO | Admitting: Student

## 2017-02-17 VITALS — BP 106/66 | HR 119 | Temp 98.4°F | Wt 152.0 lb

## 2017-02-17 DIAGNOSIS — Z3483 Encounter for supervision of other normal pregnancy, third trimester: Secondary | ICD-10-CM | POA: Insufficient documentation

## 2017-02-17 LAB — OB RESULTS CONSOLE GC/CHLAMYDIA: Gonorrhea: NEGATIVE

## 2017-02-17 NOTE — Patient Instructions (Signed)
Follow up in one week If you feel strong regular contractions, have vaginal bleeding like a period, loss of fluid like you broke your water go to [facility]. If you feel baby is moving less, sit in a quiet place and focus on baby. You want at least 10 movements in 2 hours. These movements can be subtle!   Third Trimester of Pregnancy The third trimester is from week 28 through week 40 (months 7 through 9). The third trimester is a time when the unborn baby (fetus) is growing rapidly. At the end of the ninth month, the fetus is about 20 inches in length and weighs 6-10 pounds. Body changes during your third trimester Your body will continue to go through many changes during pregnancy. The changes vary from woman to woman. During the third trimester:  Your weight will continue to increase. You can expect to gain 25-35 pounds (11-16 kg) by the end of the pregnancy.  You may begin to get stretch marks on your hips, abdomen, and breasts.  You may urinate more often because the fetus is moving lower into your pelvis and pressing on your bladder.  You may develop or continue to have heartburn. This is caused by increased hormones that slow down muscles in the digestive tract.  You may develop or continue to have constipation because increased hormones slow digestion and cause the muscles that push waste through your intestines to relax.  You may develop hemorrhoids. These are swollen veins (varicose veins) in the rectum that can itch or be painful.  You may develop swollen, bulging veins (varicose veins) in your legs.  You may have increased body aches in the pelvis, back, or thighs. This is due to weight gain and increased hormones that are relaxing your joints.  You may have changes in your hair. These can include thickening of your hair, rapid growth, and changes in texture. Some women also have hair loss during or after pregnancy, or hair that feels dry or thin. Your hair will most likely  return to normal after your baby is born.  Your breasts will continue to grow and they will continue to become tender. A yellow fluid (colostrum) may leak from your breasts. This is the first milk you are producing for your baby.  Your belly button may stick out.  You may notice more swelling in your hands, face, or ankles.  You may have increased tingling or numbness in your hands, arms, and legs. The skin on your belly may also feel numb.  You may feel short of breath because of your expanding uterus.  You may have more problems sleeping. This can be caused by the size of your belly, increased need to urinate, and an increase in your body's metabolism.  You may notice the fetus "dropping," or moving lower in your abdomen (lightening).  You may have increased vaginal discharge.  You may notice your joints feel loose and you may have pain around your pelvic bone. What to expect at prenatal visits You will have prenatal exams every 2 weeks until week 36. Then you will have weekly prenatal exams. During a routine prenatal visit:  You will be weighed to make sure you and the baby are growing normally.  Your blood pressure will be taken.  Your abdomen will be measured to track your baby's growth.  The fetal heartbeat will be listened to.  Any test results from the previous visit will be discussed.  You may have a cervical check near your  due date to see if your cervix has softened or thinned (effaced).  You will be tested for Group B streptococcus. This happens between 35 and 37 weeks. Your health care provider may ask you:  What your birth plan is.  How you are feeling.  If you are feeling the baby move.  If you have had any abnormal symptoms, such as leaking fluid, bleeding, severe headaches, or abdominal cramping.  If you are using any tobacco products, including cigarettes, chewing tobacco, and electronic cigarettes.  If you have any questions. Other tests or  screenings that may be performed during your third trimester include:  Blood tests that check for low iron levels (anemia).  Fetal testing to check the health, activity level, and growth of the fetus. Testing is done if you have certain medical conditions or if there are problems during the pregnancy.  Nonstress test (NST). This test checks the health of your baby to make sure there are no signs of problems, such as the baby not getting enough oxygen. During this test, a belt is placed around your belly. The baby is made to move, and its heart rate is monitored during movement. What is false labor? False labor is a condition in which you feel small, irregular tightenings of the muscles in the womb (contractions) that usually go away with rest, changing position, or drinking water. These are called Braxton Hicks contractions. Contractions may last for hours, days, or even weeks before true labor sets in. If contractions come at regular intervals, become more frequent, increase in intensity, or become painful, you should see your health care provider. What are the signs of labor?  Abdominal cramps.  Regular contractions that start at 10 minutes apart and become stronger and more frequent with time.  Contractions that start on the top of the uterus and spread down to the lower abdomen and back.  Increased pelvic pressure and dull back pain.  A watery or bloody mucus discharge that comes from the vagina.  Leaking of amniotic fluid. This is also known as your "water breaking." It could be a slow trickle or a gush. Let your health care provider know if it has a color or strange odor. If you have any of these signs, call your health care provider right away, even if it is before your due date. Follow these instructions at home: Medicines   Follow your health care provider's instructions regarding medicine use. Specific medicines may be either safe or unsafe to take during pregnancy.  Take a  prenatal vitamin that contains at least 600 micrograms (mcg) of folic acid.  If you develop constipation, try taking a stool softener if your health care provider approves. Eating and drinking   Eat a balanced diet that includes fresh fruits and vegetables, whole grains, good sources of protein such as meat, eggs, or tofu, and low-fat dairy. Your health care provider will help you determine the amount of weight gain that is right for you.  Avoid raw meat and uncooked cheese. These carry germs that can cause birth defects in the baby.  If you have low calcium intake from food, talk to your health care provider about whether you should take a daily calcium supplement.  Eat four or five small meals rather than three large meals a day.  Limit foods that are high in fat and processed sugars, such as fried and sweet foods.  To prevent constipation:  Drink enough fluid to keep your urine clear or pale yellow.  Eat  foods that are high in fiber, such as fresh fruits and vegetables, whole grains, and beans. Activity   Exercise only as directed by your health care provider. Most women can continue their usual exercise routine during pregnancy. Try to exercise for 30 minutes at least 5 days a week. Stop exercising if you experience uterine contractions.  Avoid heavy lifting.  Do not exercise in extreme heat or humidity, or at high altitudes.  Wear low-heel, comfortable shoes.  Practice good posture.  You may continue to have sex unless your health care provider tells you otherwise. Relieving pain and discomfort   Take frequent breaks and rest with your legs elevated if you have leg cramps or low back pain.  Take warm sitz baths to soothe any pain or discomfort caused by hemorrhoids. Use hemorrhoid cream if your health care provider approves.  Wear a good support bra to prevent discomfort from breast tenderness.  If you develop varicose veins:  Wear support pantyhose or compression  stockings as told by your healthcare provider.  Elevate your feet for 15 minutes, 3-4 times a day. Prenatal care   Write down your questions. Take them to your prenatal visits.  Keep all your prenatal visits as told by your health care provider. This is important. Safety   Wear your seat belt at all times when driving.  Make a list of emergency phone numbers, including numbers for family, friends, the hospital, and police and fire departments. General instructions   Avoid cat litter boxes and soil used by cats. These carry germs that can cause birth defects in the baby. If you have a cat, ask someone to clean the litter box for you.  Do not travel far distances unless it is absolutely necessary and only with the approval of your health care provider.  Do not use hot tubs, steam rooms, or saunas.  Do not drink alcohol.  Do not use any products that contain nicotine or tobacco, such as cigarettes and e-cigarettes. If you need help quitting, ask your health care provider.  Do not use any medicinal herbs or unprescribed drugs. These chemicals affect the formation and growth of the baby.  Do not douche or use tampons or scented sanitary pads.  Do not cross your legs for long periods of time.  To prepare for the arrival of your baby:  Take prenatal classes to understand, practice, and ask questions about labor and delivery.  Make a trial run to the hospital.  Visit the hospital and tour the maternity area.  Arrange for maternity or paternity leave through employers.  Arrange for family and friends to take care of pets while you are in the hospital.  Purchase a rear-facing car seat and make sure you know how to install it in your car.  Pack your hospital bag.  Prepare the baby's nursery. Make sure to remove all pillows and stuffed animals from the baby's crib to prevent suffocation.  Visit your dentist if you have not gone during your pregnancy. Use a soft toothbrush to  brush your teeth and be gentle when you floss. Contact a health care provider if:  You are unsure if you are in labor or if your water has broken.  You become dizzy.  You have mild pelvic cramps, pelvic pressure, or nagging pain in your abdominal area.  You have lower back pain.  You have persistent nausea, vomiting, or diarrhea.  You have an unusual or bad smelling vaginal discharge.  You have pain when you urinate.  Get help right away if:  Your water breaks before 37 weeks.  You have regular contractions less than 5 minutes apart before 37 weeks.  You have a fever.  You are leaking fluid from your vagina.  You have spotting or bleeding from your vagina.  You have severe abdominal pain or cramping.  You have rapid weight loss or weight gain.  You have shortness of breath with chest pain.  You notice sudden or extreme swelling of your face, hands, ankles, feet, or legs.  Your baby makes fewer than 10 movements in 2 hours.  You have severe headaches that do not go away when you take medicine.  You have vision changes. Summary  The third trimester is from week 28 through week 40, months 7 through 9. The third trimester is a time when the unborn baby (fetus) is growing rapidly.  During the third trimester, your discomfort may increase as you and your baby continue to gain weight. You may have abdominal, leg, and back pain, sleeping problems, and an increased need to urinate.  During the third trimester your breasts will keep growing and they will continue to become tender. A yellow fluid (colostrum) may leak from your breasts. This is the first milk you are producing for your baby.  False labor is a condition in which you feel small, irregular tightenings of the muscles in the womb (contractions) that eventually go away. These are called Braxton Hicks contractions. Contractions may last for hours, days, or even weeks before true labor sets in.  Signs of labor can  include: abdominal cramps; regular contractions that start at 10 minutes apart and become stronger and more frequent with time; watery or bloody mucus discharge that comes from the vagina; increased pelvic pressure and dull back pain; and leaking of amniotic fluid. This information is not intended to replace advice given to you by your health care provider. Make sure you discuss any questions you have with your health care provider. Document Released: 11/10/2001 Document Revised: 04/23/2016 Document Reviewed: 01/17/2013 [employer] Interactive Patient Education  2017 [employer]

## 2017-02-17 NOTE — Progress Notes (Signed)
G2P0010 at  36/1 ( LMP=U=12) Preg c/b bipolar DO/MDD not on medications, h/o drug use, GBS in urine, h/o genital HSV S: denies contractions, loss of fluid, vaginal bleeding, feels good fetal movement O: See tab  A/P IUP at 36/1, doing well Routine OB: GC/CT collected today, continue PNV, s/p Tdap in 11/2016 Bipolar DO: SW consult after delivery, does have behavioral health upcoming HSV: on acyclovir suppression Post partum: boy- desires circ, breast, copper IUD  Follow up in one week  Labor precautions reviewed  Alyssa A. Kennon RoundsHaney MD, MS Family Medicine Resident PGY-3 Pager [phone]

## 2017-02-18 LAB — CERVICOVAGINAL ANCILLARY ONLY
Chlamydia: NEGATIVE
Neisseria Gonorrhea: NEGATIVE

## 2017-02-19 ENCOUNTER — Telehealth: Payer: Self-pay | Admitting: *Deleted

## 2017-02-19 NOTE — Progress Notes (Signed)
Please call the patient and tell her her GC/Ct were negative

## 2017-02-19 NOTE — Telephone Encounter (Signed)
Patient is aware of negative tests. Jazmin Hartsell,CMA

## 2017-02-19 NOTE — Telephone Encounter (Signed)
-----   Message from Bonney AidAlyssa A Haney, MD sent at 02/19/2017 11:27 AM EDT ----- Please call the patient and tell her her GC/Ct were negative

## 2017-02-24 ENCOUNTER — Ambulatory Visit (INDEPENDENT_AMBULATORY_CARE_PROVIDER_SITE_OTHER): Payer: BC Managed Care – PPO | Admitting: Student

## 2017-02-24 VITALS — BP 102/70 | HR 101 | Temp 98.7°F | Wt 153.0 lb

## 2017-02-24 DIAGNOSIS — Z3483 Encounter for supervision of other normal pregnancy, third trimester: Secondary | ICD-10-CM

## 2017-02-24 MED ORDER — ACYCLOVIR 400 MG PO TABS: 400.0000 mg | ORAL_TABLET | Freq: Three times a day (TID) | ORAL | 1 refills | Status: DC

## 2017-02-24 NOTE — Patient Instructions (Signed)
Follow up in one week with Dr Ottie Glazier  If you feel strong regular contractions, have vaginal bleeding like a period, loss of fluid like you broke your water go to [facility]. If you feel baby is moving less, sit in a quiet place and focus on baby. You want at least 10 movements in 2 hours. These movements can be subtle!   Third Trimester of Pregnancy The third trimester is from week 28 through week 40 (months 7 through 9). The third trimester is a time when the unborn baby (fetus) is growing rapidly. At the end of the ninth month, the fetus is about 20 inches in length and weighs 6-10 pounds. Body changes during your third trimester Your body will continue to go through many changes during pregnancy. The changes vary from woman to woman. During the third trimester:  Your weight will continue to increase. You can expect to gain 25-35 pounds (11-16 kg) by the end of the pregnancy.  You may begin to get stretch marks on your hips, abdomen, and breasts.  You may urinate more often because the fetus is moving lower into your pelvis and pressing on your bladder.  You may develop or continue to have heartburn. This is caused by increased hormones that slow down muscles in the digestive tract.  You may develop or continue to have constipation because increased hormones slow digestion and cause the muscles that push waste through your intestines to relax.  You may develop hemorrhoids. These are swollen veins (varicose veins) in the rectum that can itch or be painful.  You may develop swollen, bulging veins (varicose veins) in your legs.  You may have increased body aches in the pelvis, back, or thighs. This is due to weight gain and increased hormones that are relaxing your joints.  You may have changes in your hair. These can include thickening of your hair, rapid growth, and changes in texture. Some women also have hair loss during or after pregnancy, or hair that feels dry or thin. Your  hair will most likely return to normal after your baby is born.  Your breasts will continue to grow and they will continue to become tender. A yellow fluid (colostrum) may leak from your breasts. This is the first milk you are producing for your baby.  Your belly button may stick out.  You may notice more swelling in your hands, face, or ankles.  You may have increased tingling or numbness in your hands, arms, and legs. The skin on your belly may also feel numb.  You may feel short of breath because of your expanding uterus.  You may have more problems sleeping. This can be caused by the size of your belly, increased need to urinate, and an increase in your body's metabolism.  You may notice the fetus "dropping," or moving lower in your abdomen (lightening).  You may have increased vaginal discharge.  You may notice your joints feel loose and you may have pain around your pelvic bone. What to expect at prenatal visits You will have prenatal exams every 2 weeks until week 36. Then you will have weekly prenatal exams. During a routine prenatal visit:  You will be weighed to make sure you and the baby are growing normally.  Your blood pressure will be taken.  Your abdomen will be measured to track your baby's growth.  The fetal heartbeat will be listened to.  Any test results from the previous visit will be discussed.  You may have a  cervical check near your due date to see if your cervix has softened or thinned (effaced).  You will be tested for Group B streptococcus. This happens between 35 and 37 weeks. Your health care provider may ask you:  What your birth plan is.  How you are feeling.  If you are feeling the baby move.  If you have had any abnormal symptoms, such as leaking fluid, bleeding, severe headaches, or abdominal cramping.  If you are using any tobacco products, including cigarettes, chewing tobacco, and electronic cigarettes.  If you have any  questions. Other tests or screenings that may be performed during your third trimester include:  Blood tests that check for low iron levels (anemia).  Fetal testing to check the health, activity level, and growth of the fetus. Testing is done if you have certain medical conditions or if there are problems during the pregnancy.  Nonstress test (NST). This test checks the health of your baby to make sure there are no signs of problems, such as the baby not getting enough oxygen. During this test, a belt is placed around your belly. The baby is made to move, and its heart rate is monitored during movement. What is false labor? False labor is a condition in which you feel small, irregular tightenings of the muscles in the womb (contractions) that usually go away with rest, changing position, or drinking water. These are called Braxton Hicks contractions. Contractions may last for hours, days, or even weeks before true labor sets in. If contractions come at regular intervals, become more frequent, increase in intensity, or become painful, you should see your health care provider. What are the signs of labor?  Abdominal cramps.  Regular contractions that start at 10 minutes apart and become stronger and more frequent with time.  Contractions that start on the top of the uterus and spread down to the lower abdomen and back.  Increased pelvic pressure and dull back pain.  A watery or bloody mucus discharge that comes from the vagina.  Leaking of amniotic fluid. This is also known as your "water breaking." It could be a slow trickle or a gush. Let your health care provider know if it has a color or strange odor. If you have any of these signs, call your health care provider right away, even if it is before your due date. Follow these instructions at home: Medicines   Follow your health care provider's instructions regarding medicine use. Specific medicines may be either safe or unsafe to take  during pregnancy.  Take a prenatal vitamin that contains at least 600 micrograms (mcg) of folic acid.  If you develop constipation, try taking a stool softener if your health care provider approves. Eating and drinking   Eat a balanced diet that includes fresh fruits and vegetables, whole grains, good sources of protein such as meat, eggs, or tofu, and low-fat dairy. Your health care provider will help you determine the amount of weight gain that is right for you.  Avoid raw meat and uncooked cheese. These carry germs that can cause birth defects in the baby.  If you have low calcium intake from food, talk to your health care provider about whether you should take a daily calcium supplement.  Eat four or five small meals rather than three large meals a day.  Limit foods that are high in fat and processed sugars, such as fried and sweet foods.  To prevent constipation:  Drink enough fluid to keep your urine clear or  pale yellow.  Eat foods that are high in fiber, such as fresh fruits and vegetables, whole grains, and beans. Activity   Exercise only as directed by your health care provider. Most women can continue their usual exercise routine during pregnancy. Try to exercise for 30 minutes at least 5 days a week. Stop exercising if you experience uterine contractions.  Avoid heavy lifting.  Do not exercise in extreme heat or humidity, or at high altitudes.  Wear low-heel, comfortable shoes.  Practice good posture.  You may continue to have sex unless your health care provider tells you otherwise. Relieving pain and discomfort   Take frequent breaks and rest with your legs elevated if you have leg cramps or low back pain.  Take warm sitz baths to soothe any pain or discomfort caused by hemorrhoids. Use hemorrhoid cream if your health care provider approves.  Wear a good support bra to prevent discomfort from breast tenderness.  If you develop varicose veins:  Wear support  pantyhose or compression stockings as told by your healthcare provider.  Elevate your feet for 15 minutes, 3-4 times a day. Prenatal care   Write down your questions. Take them to your prenatal visits.  Keep all your prenatal visits as told by your health care provider. This is important. Safety   Wear your seat belt at all times when driving.  Make a list of emergency phone numbers, including numbers for family, friends, the hospital, and police and fire departments. General instructions   Avoid cat litter boxes and soil used by cats. These carry germs that can cause birth defects in the baby. If you have a cat, ask someone to clean the litter box for you.  Do not travel far distances unless it is absolutely necessary and only with the approval of your health care provider.  Do not use hot tubs, steam rooms, or saunas.  Do not drink alcohol.  Do not use any products that contain nicotine or tobacco, such as cigarettes and e-cigarettes. If you need help quitting, ask your health care provider.  Do not use any medicinal herbs or unprescribed drugs. These chemicals affect the formation and growth of the baby.  Do not douche or use tampons or scented sanitary pads.  Do not cross your legs for long periods of time.  To prepare for the arrival of your baby:  Take prenatal classes to understand, practice, and ask questions about labor and delivery.  Make a trial run to the hospital.  Visit the hospital and tour the maternity area.  Arrange for maternity or paternity leave through employers.  Arrange for family and friends to take care of pets while you are in the hospital.  Purchase a rear-facing car seat and make sure you know how to install it in your car.  Pack your hospital bag.  Prepare the baby's nursery. Make sure to remove all pillows and stuffed animals from the baby's crib to prevent suffocation.  Visit your dentist if you have not gone during your pregnancy. Use  a soft toothbrush to brush your teeth and be gentle when you floss. Contact a health care provider if:  You are unsure if you are in labor or if your water has broken.  You become dizzy.  You have mild pelvic cramps, pelvic pressure, or nagging pain in your abdominal area.  You have lower back pain.  You have persistent nausea, vomiting, or diarrhea.  You have an unusual or bad smelling vaginal discharge.  You have  pain when you urinate. Get help right away if:  Your water breaks before 37 weeks.  You have regular contractions less than 5 minutes apart before 37 weeks.  You have a fever.  You are leaking fluid from your vagina.  You have spotting or bleeding from your vagina.  You have severe abdominal pain or cramping.  You have rapid weight loss or weight gain.  You have shortness of breath with chest pain.  You notice sudden or extreme swelling of your face, hands, ankles, feet, or legs.  Your baby makes fewer than 10 movements in 2 hours.  You have severe headaches that do not go away when you take medicine.  You have vision changes. Summary  The third trimester is from week 28 through week 40, months 7 through 9. The third trimester is a time when the unborn baby (fetus) is growing rapidly.  During the third trimester, your discomfort may increase as you and your baby continue to gain weight. You may have abdominal, leg, and back pain, sleeping problems, and an increased need to urinate.  During the third trimester your breasts will keep growing and they will continue to become tender. A yellow fluid (colostrum) may leak from your breasts. This is the first milk you are producing for your baby.  False labor is a condition in which you feel small, irregular tightenings of the muscles in the womb (contractions) that eventually go away. These are called Braxton Hicks contractions. Contractions may last for hours, days, or even weeks before true labor sets  in.  Signs of labor can include: abdominal cramps; regular contractions that start at 10 minutes apart and become stronger and more frequent with time; watery or bloody mucus discharge that comes from the vagina; increased pelvic pressure and dull back pain; and leaking of amniotic fluid. This information is not intended to replace advice given to you by your health care provider. Make sure you discuss any questions you have with your health care provider. Document Released: 11/10/2001 Document Revised: 04/23/2016 Document Reviewed: 01/17/2013 Elsevier Interactive Patient Education  2017 [employer].

## 2017-02-24 NOTE — Progress Notes (Signed)
G2P0010 at  37/1 ( LMP=U=12) Preg c/b bipolar DO/MDD not on medications, h/o drug use, GBS in urine, h/o genital HSV S: denies contractions, loss of fluid, vaginal bleeding, feels good fetal movement O: See tab  A/P IUP at 37/1, doing well Routine OB: GC/CT collected today, continue PNV, s/p Tdap in 11/2016, GBS+ urine will need antibiotics in labor Bipolar DO: SW consult after delivery HSV: on acyclovir suppression Post partum: boy- desires circ, breast, copper IUD  Follow up in one week  Labor precautions reviewed  Alyssa A. Kennon RoundsHaney MD, MS Family Medicine Resident PGY-3 Pager [phone]

## 2017-03-03 ENCOUNTER — Ambulatory Visit (INDEPENDENT_AMBULATORY_CARE_PROVIDER_SITE_OTHER): Payer: BC Managed Care – PPO | Admitting: Student

## 2017-03-03 VITALS — BP 100/60 | HR 92 | Temp 98.9°F | Wt 155.0 lb

## 2017-03-03 DIAGNOSIS — Z3483 Encounter for supervision of other normal pregnancy, third trimester: Secondary | ICD-10-CM

## 2017-03-03 NOTE — Patient Instructions (Signed)
Follow up in 1 week  If you feel strong regular contractions, have vaginal bleeding like a period, loss of fluid like you broke your water go to [facility]. If you feel baby is moving less, sit in a quiet place and focus on baby. You want at least 10 movements in 2 hours. These movements can be subtle!   Third Trimester of Pregnancy The third trimester is from week 29 through week 42, months 7 through 9. This trimester is when your unborn baby (fetus) is growing very fast. At the end of the ninth month, the unborn baby is about 20 inches in length. It weighs about 6-10 pounds. Follow these instructions at home:  Avoid all smoking, herbs, and alcohol. Avoid drugs not approved by your doctor.  Do not use any tobacco products, including cigarettes, chewing tobacco, and electronic cigarettes. If you need help quitting, ask your doctor. You may get counseling or other support to help you quit.  Only take medicine as told by your doctor. Some medicines are safe and some are not during pregnancy.  Exercise only as told by your doctor. Stop exercising if you start having cramps.  Eat regular, healthy meals.  Wear a good support bra if your breasts are tender.  Do not use hot tubs, steam rooms, or saunas.  Wear your seat belt when driving.  Avoid raw meat, uncooked cheese, and liter boxes and soil used by cats.  Take your prenatal vitamins.  Take 1500-2000 milligrams of calcium daily starting at the 20th week of pregnancy until you deliver your baby.  Try taking medicine that helps you poop (stool softener) as needed, and if your doctor approves. Eat more fiber by eating fresh fruit, vegetables, and whole grains. Drink enough fluids to keep your pee (urine) clear or pale yellow.  Take warm water baths (sitz baths) to soothe pain or discomfort caused by hemorrhoids. Use hemorrhoid cream if your doctor approves.  If you have puffy, bulging veins (varicose veins), wear support hose.  Raise (elevate) your feet for 15 minutes, 3-4 times a day. Limit salt in your diet.  Avoid heavy lifting, wear low heels, and sit up straight.  Rest with your legs raised if you have leg cramps or low back pain.  Visit your dentist if you have not gone during your pregnancy. Use a soft toothbrush to brush your teeth. Be gentle when you floss.  You can have sex (intercourse) unless your doctor tells you not to.  Do not travel far distances unless you must. Only do so with your doctor's approval.  Take prenatal classes.  Practice driving to the hospital.  Pack your hospital bag.  Prepare the baby's room.  Go to your doctor visits. Get help if:  You are not sure if you are in labor or if your water has broken.  You are dizzy.  You have mild cramps or pressure in your lower belly (abdominal).  You have a nagging pain in your belly area.  You continue to feel sick to your stomach (nauseous), throw up (vomit), or have watery poop (diarrhea).  You have bad smelling fluid coming from your vagina.  You have pain with peeing (urination). Get help right away if:  You have a fever.  You are leaking fluid from your vagina.  You are spotting or bleeding from your vagina.  You have severe belly cramping or pain.  You lose or gain weight rapidly.  You have trouble catching your breath and have chest pain.  You notice sudden or extreme puffiness (swelling) of your face, hands, ankles, feet, or legs.  You have not felt the baby move in over an hour.  You have severe headaches that do not go away with medicine.  You have vision changes. This information is not intended to replace advice given to you by your health care provider. Make sure you discuss any questions you have with your health care provider. Document Released: 02/10/2010 Document Revised: 04/23/2016 Document Reviewed: 01/17/2013 Elsevier Interactive Patient Education  2017 [employer]

## 2017-03-03 NOTE — Progress Notes (Signed)
G2P0010 at 38/1 ( LMP=U=12) Preg c/b bipolar DO/MDD not on medications, h/o drug use, GBS in urine, h/o genital HSV S: denies contractions, loss of fluid, vaginal bleeding, feels good fetal movement O: See tab  A/P IUP at 38/1, doing well Routine OB: GC/CT negative, continue PNV, s/p Tdap in 11/2016, GBS+ urine will need antibiotics in labor Bipolar DO: SW consult after delivery HSV: on acyclovir suppression Post partum: boy- desires circ, breast, copper IUD  Follow up in one week  Labor precautions reviewed  Alyssa A. Kennon Rounds MD, MS Family Medicine Resident PGY-3 Pager [phone]

## 2017-03-04 ENCOUNTER — Ambulatory Visit (INDEPENDENT_AMBULATORY_CARE_PROVIDER_SITE_OTHER): Payer: BC Managed Care – PPO | Admitting: Psychiatry

## 2017-03-04 ENCOUNTER — Encounter (HOSPITAL_COMMUNITY): Payer: Self-pay | Admitting: Psychiatry

## 2017-03-04 VITALS — BP 120/80 | HR 90 | Ht 60.5 in | Wt 154.8 lb

## 2017-03-04 DIAGNOSIS — F3175 Bipolar disorder, in partial remission, most recent episode depressed: Secondary | ICD-10-CM | POA: Diagnosis not present

## 2017-03-04 DIAGNOSIS — Z818 Family history of other mental and behavioral disorders: Secondary | ICD-10-CM | POA: Diagnosis not present

## 2017-03-04 DIAGNOSIS — F17210 Nicotine dependence, cigarettes, uncomplicated: Secondary | ICD-10-CM

## 2017-03-04 DIAGNOSIS — Z79899 Other long term (current) drug therapy: Secondary | ICD-10-CM

## 2017-03-04 DIAGNOSIS — F4310 Post-traumatic stress disorder, unspecified: Secondary | ICD-10-CM

## 2017-03-04 DIAGNOSIS — Z811 Family history of alcohol abuse and dependence: Secondary | ICD-10-CM

## 2017-03-04 DIAGNOSIS — Z810 Family history of intellectual disabilities: Secondary | ICD-10-CM

## 2017-03-04 DIAGNOSIS — Z813 Family history of other psychoactive substance abuse and dependence: Secondary | ICD-10-CM

## 2017-03-04 DIAGNOSIS — F603 Borderline personality disorder: Secondary | ICD-10-CM | POA: Diagnosis not present

## 2017-03-04 MED ORDER — ARIPIPRAZOLE 5 MG PO TABS: 5.0000 mg | ORAL_TABLET | Freq: Every day | ORAL | 0 refills | Status: DC

## 2017-03-04 MED ORDER — CVS PILL SPLITTER MISC: 1.0000 [IU] | 1 refills | Status: DC | PRN

## 2017-03-04 NOTE — Patient Instructions (Signed)
Start abilify 2.5 mg daily  Increase to 5 mg in 1 week    Aripiprazole tablets What is this medicine? ARIPIPRAZOLE (ay ri PIP ray zole) is an atypical antipsychotic. It is used to treat schizophrenia and bipolar disorder, also known as manic-depression. It is also used to treat Tourette's disorder and some symptoms of autism. This medicine may also be used in combination with antidepressants to treat major depressive disorder. This medicine may be used for other purposes; ask your health care provider or pharmacist if you have questions. COMMON BRAND NAME(S): Abilify What should I tell my health care provider before I take this medicine? They need to know if you have any of these conditions: -dehydration -dementia -diabetes -heart disease -history of stroke -low blood counts, like low white cell, platelet, or red cell counts -Parkinson's disease -seizures -suicidal thoughts, plans, or attempt; a previous suicide attempt by you or a family member -an unusual or allergic reaction to aripiprazole, other medicines, foods, dyes, or preservatives -pregnant or trying to get pregnant -breast-feeding How should I use this medicine? Take this medicine by mouth with a glass of water. Follow the directions on the prescription label. You can take this medicine with or without food. Take your doses at regular intervals. Do not take your medicine more often than directed. Do not stop taking except on the advice of your doctor or health care professional. A special MedGuide will be given to you by the pharmacist with each prescription and refill. Be sure to read this information carefully each time. Talk to your pediatrician regarding the use of this medicine in children. While this drug may be prescribed for children as young as 79 years of age for selected conditions, precautions do apply. Overdosage: If you think you have taken too much of this medicine contact a poison control center or emergency  room at once. NOTE: This medicine is only for you. Do not share this medicine with others. What if I miss a dose? If you miss a dose, take it as soon as you can. If it is almost time for your next dose, take only that dose. Do not take double or extra doses. What may interact with this medicine? Do not take this medicine with any of the following medications: -brexpiprazole -cisapride -dofetilide -dronedarone -metoclopramide -pimozide -thioridazine This medicine may also interact with the following medications: -alcohol -carbamazepine -certain medicines for anxiety or sleep -certain medicines for blood pressure -certain medicines for fungal infections like ketoconazole, fluconazole, posaconazole, and itraconazole -clarithromycin -fluoxetine -other medicines that prolong the QT interval (cause an abnormal heart rhythm) -paroxetine -quinidine -rifampin This list may not describe all possible interactions. Give your health care provider a list of all the medicines, herbs, non-prescription drugs, or dietary supplements you use. Also tell them if you smoke, drink alcohol, or use illegal drugs. Some items may interact with your medicine. What should I watch for while using this medicine? Visit your doctor or health care professional for regular checks on your progress. It may be several weeks before you see the full effects of this medicine. Do not suddenly stop taking this medicine. You may need to gradually reduce the dose. Patients and their families should watch out for worsening depression or thoughts of suicide. Also watch out for sudden changes in feelings such as feeling anxious, agitated, panicky, irritable, hostile, aggressive, impulsive, severely restless, overly excited and hyperactive, or not being able to sleep. If this happens, especially at the beginning of antidepressant treatment or after  a change in dose, call your health care professional. You may get dizzy or drowsy. Do  not drive, use machinery, or do anything that needs mental alertness until you know how this medicine affects you. Do not stand or sit up quickly, especially if you are an older patient. This reduces the risk of dizzy or fainting spells. Alcohol can increase dizziness and drowsiness. Avoid alcoholic drinks. This medicine can reduce the response of your body to heat or cold. Dress warm in cold weather and stay hydrated in hot weather. If possible, avoid extreme temperatures like saunas, hot tubs, very hot or cold showers, or activities that can cause dehydration such as vigorous exercise. This medicine may cause dry eyes and blurred vision. If you wear contact lenses you may feel some discomfort. Lubricating drops may help. See your eye doctor if the problem does not go away or is severe. If you notice an increased hunger or thirst, different from your normal hunger or thirst, or if you find that you have to urinate more frequently, you should contact your health care provider as soon as possible. You may need to have your blood sugar monitored. This medicine may cause changes in your blood sugar levels. You should monitor you blood sugar frequently if you have diabetes. There have been reports of uncontrollable and strong urges to gamble, binge eat, shop, and have sex while taking this medicine. If you experience any of these or other uncontrollable and strong urges while taking this medicine, you should report it to your health care provider as soon as possible. What side effects may I notice from receiving this medicine? Side effects that you should report to your doctor or health care professional as soon as possible: -allergic reactions like skin rash, itching or hives, swelling of the face, lips, or tongue -breathing problems -confusion -feeling faint or lightheaded, falls -fever or chills, sore throat -increased hunger or thirst -increased urination -joint pain -muscles pain, spasms -problems  with balance, talking, walking -restlessness or need to keep moving -seizures -suicidal thoughts or other mood changes -trouble swallowing -uncontrollable and excessive urges (examples: gambling, binge eating, shopping, having sex) -uncontrollable head, mouth, neck, arm, or leg movements -unusually weak or tired Side effects that usually do not require medical attention (report to your doctor or health care professional if they continue or are bothersome): -blurred vision -constipation -headache -nausea, vomiting -trouble sleeping -weight gain This list may not describe all possible side effects. Call your doctor for medical advice about side effects. You may report side effects to FDA at 1-800-FDA-1088. Where should I keep my medicine? Keep out of the reach of children. Store at room temperature between 15 and 30 degrees C (59 and 86 degrees F). Throw away any unused medicine after the expiration date. NOTE: This sheet is a summary. It may not cover all possible information. If you have questions about this medicine, talk to your doctor, pharmacist, or health care provider.  2018 Elsevier/Gold Standard (2016-11-01 11:45:05)
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
## 2017-03-06 ENCOUNTER — Encounter (HOSPITAL_COMMUNITY): Payer: Self-pay

## 2017-03-06 ENCOUNTER — Inpatient Hospital Stay (HOSPITAL_COMMUNITY)
Admission: AD | Admit: 2017-03-06 | Discharge: 2017-03-06 | Disposition: A | Discharge status: Home / Self Care | Payer: BC Managed Care – PPO | Source: Ambulatory Visit | Attending: Obstetrics and Gynecology | Admitting: Obstetrics and Gynecology

## 2017-03-06 DIAGNOSIS — Z3A38 38 weeks gestation of pregnancy: Secondary | ICD-10-CM | POA: Diagnosis not present

## 2017-03-06 DIAGNOSIS — O479 False labor, unspecified: Secondary | ICD-10-CM | POA: Insufficient documentation
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
## 2017-03-06 NOTE — MAU Note (Signed)
Urine sent to lab 

## 2017-03-09 ENCOUNTER — Ambulatory Visit (INDEPENDENT_AMBULATORY_CARE_PROVIDER_SITE_OTHER): Payer: BC Managed Care – PPO | Admitting: Family Medicine

## 2017-03-09 VITALS — BP 102/72 | Temp 98.3°F | Wt 153.6 lb

## 2017-03-09 DIAGNOSIS — Z3483 Encounter for supervision of other normal pregnancy, third trimester: Secondary | ICD-10-CM

## 2017-03-09 NOTE — Patient Instructions (Signed)
Third Trimester of Pregnancy The third trimester is from week 28 through week 40 (months 7 through 9). The third trimester is a time when the unborn baby (fetus) is growing rapidly. At the end of the ninth month, the fetus is about 20 inches in length and weighs 6-10 pounds. Body changes during your third trimester Your body will continue to go through many changes during pregnancy. The changes vary from woman to woman. During the third trimester:  Your weight will continue to increase. You can expect to gain 25-35 pounds (11-16 kg) by the end of the pregnancy.  You may begin to get stretch marks on your hips, abdomen, and breasts.  You may urinate more often because the fetus is moving lower into your pelvis and pressing on your bladder.  You may develop or continue to have heartburn. This is caused by increased hormones that slow down muscles in the digestive tract.  You may develop or continue to have constipation because increased hormones slow digestion and cause the muscles that push waste through your intestines to relax.  You may develop hemorrhoids. These are swollen veins (varicose veins) in the rectum that can itch or be painful.  You may develop swollen, bulging veins (varicose veins) in your legs.  You may have increased body aches in the pelvis, back, or thighs. This is due to weight gain and increased hormones that are relaxing your joints.  You may have changes in your hair. These can include thickening of your hair, rapid growth, and changes in texture. Some women also have hair loss during or after pregnancy, or hair that feels dry or thin. Your hair will most likely return to normal after your baby is born.  Your breasts will continue to grow and they will continue to become tender. A yellow fluid (colostrum) may leak from your breasts. This is the first milk you are producing for your baby.  Your belly button may stick out.  You may notice more swelling in your hands,  face, or ankles.  You may have increased tingling or numbness in your hands, arms, and legs. The skin on your belly may also feel numb.  You may feel short of breath because of your expanding uterus.  You may have more problems sleeping. This can be caused by the size of your belly, increased need to urinate, and an increase in your body's metabolism.  You may notice the fetus "dropping," or moving lower in your abdomen (lightening).  You may have increased vaginal discharge.  You may notice your joints feel loose and you may have pain around your pelvic bone.  What to expect at prenatal visits You will have prenatal exams every 2 weeks until week 36. Then you will have weekly prenatal exams. During a routine prenatal visit:  You will be weighed to make sure you and the baby are growing normally.  Your blood pressure will be taken.  Your abdomen will be measured to track your baby's growth.  The fetal heartbeat will be listened to.  Any test results from the previous visit will be discussed.  You may have a cervical check near your due date to see if your cervix has softened or thinned (effaced).  You will be tested for Group B streptococcus. This happens between 35 and 37 weeks.  Your health care provider may ask you:  What your birth plan is.  How you are feeling.  If you are feeling the baby move.  If you have had   any abnormal symptoms, such as leaking fluid, bleeding, severe headaches, or abdominal cramping.  If you are using any tobacco products, including cigarettes, chewing tobacco, and electronic cigarettes.  If you have any questions.  Other tests or screenings that may be performed during your third trimester include:  Blood tests that check for low iron levels (anemia).  Fetal testing to check the health, activity level, and growth of the fetus. Testing is done if you have certain medical conditions or if there are problems during the  pregnancy.  Nonstress test (NST). This test checks the health of your baby to make sure there are no signs of problems, such as the baby not getting enough oxygen. During this test, a belt is placed around your belly. The baby is made to move, and its heart rate is monitored during movement.  What is false labor? False labor is a condition in which you feel small, irregular tightenings of the muscles in the womb (contractions) that usually go away with rest, changing position, or drinking water. These are called Braxton Hicks contractions. Contractions may last for hours, days, or even weeks before true labor sets in. If contractions come at regular intervals, become more frequent, increase in intensity, or become painful, you should see your health care provider. What are the signs of labor?  Abdominal cramps.  Regular contractions that start at 10 minutes apart and become stronger and more frequent with time.  Contractions that start on the top of the uterus and spread down to the lower abdomen and back.  Increased pelvic pressure and dull back pain.  A watery or bloody mucus discharge that comes from the vagina.  Leaking of amniotic fluid. This is also known as your "water breaking." It could be a slow trickle or a gush. Let your health care provider know if it has a color or strange odor. If you have any of these signs, call your health care provider right away, even if it is before your due date. Follow these instructions at home: Medicines  Follow your health care provider's instructions regarding medicine use. Specific medicines may be either safe or unsafe to take during pregnancy.  Take a prenatal vitamin that contains at least 600 micrograms (mcg) of folic acid.  If you develop constipation, try taking a stool softener if your health care provider approves. Eating and drinking  Eat a balanced diet that includes fresh fruits and vegetables, whole grains, good sources of protein  such as meat, eggs, or tofu, and low-fat dairy. Your health care provider will help you determine the amount of weight gain that is right for you.  Avoid raw meat and uncooked cheese. These carry germs that can cause birth defects in the baby.  If you have low calcium intake from food, talk to your health care provider about whether you should take a daily calcium supplement.  Eat four or five small meals rather than three large meals a day.  Limit foods that are high in fat and processed sugars, such as fried and sweet foods.  To prevent constipation: ? Drink enough fluid to keep your urine clear or pale yellow. ? Eat foods that are high in fiber, such as fresh fruits and vegetables, whole grains, and beans. Activity  Exercise only as directed by your health care provider. Most women can continue their usual exercise routine during pregnancy. Try to exercise for 30 minutes at least 5 days a week. Stop exercising if you experience uterine contractions.  Avoid heavy   lifting.  Do not exercise in extreme heat or humidity, or at high altitudes.  Wear low-heel, comfortable shoes.  Practice good posture.  You may continue to have sex unless your health care provider tells you otherwise. Relieving pain and discomfort  Take frequent breaks and rest with your legs elevated if you have leg cramps or low back pain.  Take warm sitz baths to soothe any pain or discomfort caused by hemorrhoids. Use hemorrhoid cream if your health care provider approves.  Wear a good support bra to prevent discomfort from breast tenderness.  If you develop varicose veins: ? Wear support pantyhose or compression stockings as told by your healthcare provider. ? Elevate your feet for 15 minutes, 3-4 times a day. Prenatal care  Write down your questions. Take them to your prenatal visits.  Keep all your prenatal visits as told by your health care provider. This is important. Safety  Wear your seat belt at  all times when driving.  Make a list of emergency phone numbers, including numbers for family, friends, the hospital, and police and fire departments. General instructions  Avoid cat litter boxes and soil used by cats. These carry germs that can cause birth defects in the baby. If you have a cat, ask someone to clean the litter box for you.  Do not travel far distances unless it is absolutely necessary and only with the approval of your health care provider.  Do not use hot tubs, steam rooms, or saunas.  Do not drink alcohol.  Do not use any products that contain nicotine or tobacco, such as cigarettes and e-cigarettes. If you need help quitting, ask your health care provider.  Do not use any medicinal herbs or unprescribed drugs. These chemicals affect the formation and growth of the baby.  Do not douche or use tampons or scented sanitary pads.  Do not cross your legs for long periods of time.  To prepare for the arrival of your baby: ? Take prenatal classes to understand, practice, and ask questions about labor and delivery. ? Make a trial run to the hospital. ? Visit the hospital and tour the maternity area. ? Arrange for maternity or paternity leave through employers. ? Arrange for family and friends to take care of pets while you are in the hospital. ? Purchase a rear-facing car seat and make sure you know how to install it in your car. ? Pack your hospital bag. ? Prepare the baby's nursery. Make sure to remove all pillows and stuffed animals from the baby's crib to prevent suffocation.  Visit your dentist if you have not gone during your pregnancy. Use a soft toothbrush to brush your teeth and be gentle when you floss. Contact a health care provider if:  You are unsure if you are in labor or if your water has broken.  You become dizzy.  You have mild pelvic cramps, pelvic pressure, or nagging pain in your abdominal area.  You have lower back pain.  You have persistent  nausea, vomiting, or diarrhea.  You have an unusual or bad smelling vaginal discharge.  You have pain when you urinate. Get help right away if:  Your water breaks before 37 weeks.  You have regular contractions less than 5 minutes apart before 37 weeks.  You have a fever.  You are leaking fluid from your vagina.  You have spotting or bleeding from your vagina.  You have severe abdominal pain or cramping.  You have rapid weight loss or weight gain.    You have shortness of breath with chest pain.  You notice sudden or extreme swelling of your face, hands, ankles, feet, or legs.  Your baby makes fewer than 10 movements in 2 hours.  You have severe headaches that do not go away when you take medicine.  You have vision changes. Summary  The third trimester is from week 28 through week 40, months 7 through 9. The third trimester is a time when the unborn baby (fetus) is growing rapidly.  During the third trimester, your discomfort may increase as you and your baby continue to gain weight. You may have abdominal, leg, and back pain, sleeping problems, and an increased need to urinate.  During the third trimester your breasts will keep growing and they will continue to become tender. A yellow fluid (colostrum) may leak from your breasts. This is the first milk you are producing for your baby.  False labor is a condition in which you feel small, irregular tightenings of the muscles in the womb (contractions) that eventually go away. These are called Braxton Hicks contractions. Contractions may last for hours, days, or even weeks before true labor sets in.  Signs of labor can include: abdominal cramps; regular contractions that start at 10 minutes apart and become stronger and more frequent with time; watery or bloody mucus discharge that comes from the vagina; increased pelvic pressure and dull back pain; and leaking of amniotic fluid. This information is not intended to replace advice  given to you by your health care provider. Make sure you discuss any questions you have with your health care provider. Document Released: 11/10/2001 Document Revised: 04/23/2016 Document Reviewed: 01/17/2013 Elsevier Interactive Patient Education  2017 [employer]  

## 2017-03-09 NOTE — Progress Notes (Signed)
Veronica Moore is a 21 y.o. G2P0010 at [redacted]w[redacted]d here for routine follow up.  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

## 2017-03-16 ENCOUNTER — Ambulatory Visit (INDEPENDENT_AMBULATORY_CARE_PROVIDER_SITE_OTHER): Payer: BC Managed Care – PPO | Admitting: Family Medicine

## 2017-03-16 VITALS — BP 122/88 | HR 110 | Temp 98.4°F | Wt 158.0 lb

## 2017-03-16 DIAGNOSIS — Z22330 Carrier of Group B streptococcus: Secondary | ICD-10-CM | POA: Diagnosis not present

## 2017-03-16 DIAGNOSIS — O480 Post-term pregnancy: Secondary | ICD-10-CM | POA: Diagnosis not present

## 2017-03-16 DIAGNOSIS — A6004 Herpesviral vulvovaginitis: Secondary | ICD-10-CM | POA: Diagnosis not present

## 2017-03-16 DIAGNOSIS — F3170 Bipolar disorder, currently in remission, most recent episode unspecified: Secondary | ICD-10-CM

## 2017-03-16 DIAGNOSIS — Z3483 Encounter for supervision of other normal pregnancy, third trimester: Secondary | ICD-10-CM | POA: Diagnosis not present

## 2017-03-16 NOTE — Patient Instructions (Signed)
Fetal Movement Counts  Patient Name: ________________________________________________ Patient Due Date: ____________________  What is a fetal movement count?  A fetal movement count is the number of times that you feel your baby move during a certain amount of time. This may also be called a fetal kick count. A fetal movement count is recommended for every pregnant woman. You may be asked to start counting fetal movements as early as week 28 of your pregnancy.  Pay attention to when your baby is most active. You may notice your baby's sleep and wake cycles. You may also notice things that make your baby move more. You should do a fetal movement count:  · When your baby is normally most active.  · At the same time each day.    A good time to count movements is while you are resting, after having something to eat and drink.  How do I count fetal movements?  1. Find a quiet, comfortable area. Sit, or lie down on your side.  2. Write down the date, the start time and stop time, and the number of movements that you felt between those two times. Take this information with you to your health care visits.  3. For 2 hours, count kicks, flutters, swishes, rolls, and jabs. You should feel at least 10 movements during 2 hours.  4. You may stop counting after you have felt 10 movements.  5. If you do not feel 10 movements in 2 hours, have something to eat and drink. Then, keep resting and counting for 1 hour. If you feel at least 4 movements during that hour, you may stop counting.  Contact a health care provider if:  · You feel fewer than 4 movements in 2 hours.  · Your baby is not moving like he or she usually does.  Date: ____________ Start time: ____________ Stop time: ____________ Movements: ____________  Date: ____________ Start time: ____________ Stop time: ____________ Movements: ____________  Date: ____________ Start time: ____________ Stop time: ____________ Movements: ____________  Date: ____________ Start time:  ____________ Stop time: ____________ Movements: ____________  Date: ____________ Start time: ____________ Stop time: ____________ Movements: ____________  Date: ____________ Start time: ____________ Stop time: ____________ Movements: ____________  Date: ____________ Start time: ____________ Stop time: ____________ Movements: ____________  Date: ____________ Start time: ____________ Stop time: ____________ Movements: ____________  Date: ____________ Start time: ____________ Stop time: ____________ Movements: ____________  This information is not intended to replace advice given to you by your health care provider. Make sure you discuss any questions you have with your health care provider.  Document Released: 12/16/2006 Document Revised: 07/15/2016 Document Reviewed: 12/26/2015  Elsevier Interactive Patient Education © 2017 [employer]

## 2017-03-16 NOTE — Progress Notes (Signed)
Veronica Moore is a 21 y.o. G2P0010 at [redacted]w[redacted]d here for routine follow up.    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  A/P: Pregnancy at [redacted]w[redacted]d. Doing well.   Routine OB: GC/CT negative, continue PNV, s/p Tdap in 11/2016, GBS+ urine will need antibiotics in labor Bipolar DO: SW consult after delivery HSV: on acyclovir suppression Post partum: boy- desires circ, breast, copper IUD  Twice weekly testing scheduled. Induction scheduled for 41+ weeks on 03/23/17.  Labor and fetal movement precautions reviewed.  Erasmo Downer, MD, MPH PGY-3,  [facility] 03/16/2017 11:26 AM

## 2017-03-17 ENCOUNTER — Encounter (HOSPITAL_COMMUNITY): Payer: Self-pay | Admitting: *Deleted

## 2017-03-17 ENCOUNTER — Telehealth (HOSPITAL_COMMUNITY): Payer: Self-pay | Admitting: *Deleted

## 2017-03-17 NOTE — Telephone Encounter (Signed)
Preadmission screen  

## 2017-03-18 ENCOUNTER — Other Ambulatory Visit: Payer: Self-pay | Admitting: Family Medicine

## 2017-03-18 ENCOUNTER — Inpatient Hospital Stay (EMERGENCY_DEPARTMENT_HOSPITAL)
Admission: AD | Admit: 2017-03-18 | Discharge: 2017-03-18 | Disposition: A | Discharge status: Home / Self Care | Payer: BC Managed Care – PPO | Source: Ambulatory Visit | Attending: Obstetrics & Gynecology | Admitting: Obstetrics & Gynecology

## 2017-03-18 ENCOUNTER — Ambulatory Visit (HOSPITAL_COMMUNITY)
Admission: RE | Admit: 2017-03-18 | Discharge: 2017-03-18 | Disposition: A | Discharge status: Home / Self Care | Payer: BC Managed Care – PPO | Source: Ambulatory Visit | Attending: Family Medicine | Admitting: Family Medicine

## 2017-03-18 ENCOUNTER — Encounter (HOSPITAL_COMMUNITY): Payer: Self-pay

## 2017-03-18 DIAGNOSIS — B009 Herpesviral infection, unspecified: Secondary | ICD-10-CM | POA: Insufficient documentation

## 2017-03-18 DIAGNOSIS — O99340 Other mental disorders complicating pregnancy, unspecified trimester: Secondary | ICD-10-CM | POA: Diagnosis not present

## 2017-03-18 DIAGNOSIS — O471 False labor at or after 37 completed weeks of gestation: Secondary | ICD-10-CM | POA: Insufficient documentation

## 2017-03-18 DIAGNOSIS — O480 Post-term pregnancy: Secondary | ICD-10-CM | POA: Insufficient documentation

## 2017-03-18 DIAGNOSIS — O98519 Other viral diseases complicating pregnancy, unspecified trimester: Secondary | ICD-10-CM | POA: Insufficient documentation

## 2017-03-18 DIAGNOSIS — Z3A40 40 weeks gestation of pregnancy: Secondary | ICD-10-CM

## 2017-03-18 DIAGNOSIS — Z87891 Personal history of nicotine dependence: Secondary | ICD-10-CM

## 2017-03-18 DIAGNOSIS — O479 False labor, unspecified: Secondary | ICD-10-CM | POA: Diagnosis not present

## 2017-03-18 DIAGNOSIS — Z79899 Other long term (current) drug therapy: Secondary | ICD-10-CM | POA: Insufficient documentation

## 2017-03-18 LAB — POCT FERN TEST: POCT Fern Test: NEGATIVE

## 2017-03-18 NOTE — Discharge Instructions (Signed)
Braxton Hicks Contractions °Contractions of the uterus can occur throughout pregnancy, but they are not always a sign that you are in labor. You may have practice contractions called Braxton Hicks contractions. These false labor contractions are sometimes confused with true labor. °What are Braxton Hicks contractions? °Braxton Hicks contractions are tightening movements that occur in the muscles of the uterus before labor. Unlike true labor contractions, these contractions do not result in opening (dilation) and thinning of the cervix. Toward the end of pregnancy (32-34 weeks), Braxton Hicks contractions can happen more often and may become stronger. These contractions are sometimes difficult to tell apart from true labor because they can be very uncomfortable. You should not feel embarrassed if you go to the hospital with false labor. °Sometimes, the only way to tell if you are in true labor is for your health care provider to look for changes in the cervix. The health care provider will do a physical exam and may monitor your contractions. If you are not in true labor, the exam should show that your cervix is not dilating and your water has not broken. °If there are no prenatal problems or other health problems associated with your pregnancy, it is completely safe for you to be sent home with false labor. You may continue to have Braxton Hicks contractions until you go into true labor. °How can I tell the difference between true labor and false labor? °· Differences °¨ False labor °¨ Contractions last 30-70 seconds.: Contractions are usually shorter and not as strong as true labor contractions. °¨ Contractions become very regular.: Contractions are usually irregular. °¨ Discomfort is usually felt in the top of the uterus, and it spreads to the lower abdomen and low back.: Contractions are often felt in the front of the lower abdomen and in the groin. °¨ Contractions do not go away with walking.: Contractions may  go away when you walk around or change positions while lying down. °¨ Contractions usually become more intense and increase in frequency.: Contractions get weaker and are shorter-lasting as time goes on. °¨ The cervix dilates and gets thinner.: The cervix usually does not dilate or become thin. °Follow these instructions at home: °¨ Take over-the-counter and prescription medicines only as told by your health care provider. °¨ Keep up with your usual exercises and follow other instructions from your health care provider. °¨ Eat and drink lightly if you think you are going into labor. °¨ If Braxton Hicks contractions are making you uncomfortable: °¨ Change your position from lying down or resting to walking, or change from walking to resting. °¨ Sit and rest in a tub of warm water. °¨ Drink enough fluid to keep your urine clear or pale yellow. Dehydration may cause these contractions. °¨ Do slow and deep breathing several times an hour. °¨ Keep all follow-up prenatal visits as told by your health care provider. This is important. °Contact a health care provider if: °¨ You have a fever. °¨ You have continuous pain in your abdomen. °Get help right away if: °¨ Your contractions become stronger, more regular, and closer together. °¨ You have fluid leaking or gushing from your vagina. °¨ You pass blood-tinged mucus (bloody show). °¨ You have bleeding from your vagina. °¨ You have low back pain that you never had before. °¨ You feel your baby’s head pushing down and causing pelvic pressure. °¨ Your baby is not moving inside you as much as it used to. °Summary °¨ Contractions that occur before labor are   called Braxton Hicks contractions, false labor, or practice contractions.  Braxton Hicks contractions are usually shorter, weaker, farther apart, and less regular than true labor contractions. True labor contractions usually become progressively stronger and regular and they become more frequent.  Manage discomfort from  [facility] contractions by changing position, resting in a warm bath, drinking plenty of water, or practicing deep breathing. This information is not intended to replace advice given to you by your health care provider. Make sure you discuss any questions you have with your health care provider. Document Released: 11/16/2005 Document Revised: 10/05/2016 Document Reviewed: 10/05/2016 Elsevier Interactive Patient Education  2017 [employer].   Labor Induction Labor induction is when steps are taken to cause a pregnant woman to begin the labor process. Most women go into labor on their own between 37 weeks and 42 weeks of the pregnancy. When this does not happen or when there is a medical need, methods may be used to induce labor. Labor induction causes a pregnant woman's uterus to contract. It also causes the cervix to soften (ripen), open (dilate), and thin out (efface). Usually, labor is not induced before 39 weeks of the pregnancy unless there is a problem with the baby or mother. Before inducing labor, your health care provider will consider a number of factors, including the following:  The medical condition of you and the baby.  How many weeks along you are.  The status of the babys lung maturity.  The condition of the cervix.  The position of the baby. What are the reasons for labor induction? Labor may be induced for the following reasons:  The health of the baby or mother is at risk.  The pregnancy is overdue by 1 week or more.  The water breaks but labor does not start on its own.  The mother has a health condition or serious illness, such as high blood pressure, infection, placental abruption, or diabetes.  The amniotic fluid amounts are low around the baby.  The baby is distressed. Convenience or wanting the baby to be born on a certain date is not a reason for inducing labor. What methods are used for labor induction? Several methods of labor induction may be used,  such as:  Prostaglandin medicine. This medicine causes the cervix to dilate and ripen. The medicine will also start contractions. It can be taken by mouth or by inserting a suppository into the vagina.  Inserting a thin tube (catheter) with a balloon on the end into the vagina to dilate the cervix. Once inserted, the balloon is expanded with water, which causes the cervix to open.  Stripping the membranes. Your health care provider separates amniotic sac tissue from the cervix, causing the cervix to be stretched and causing the release of a hormone called progesterone. This may cause the uterus to contract. It is often done during an office visit. You will be sent home to wait for the contractions to begin. You will then come in for an induction.  Breaking the water. Your health care provider makes a hole in the amniotic sac using a small instrument. Once the amniotic sac breaks, contractions should begin. This may still take hours to see an effect.  Medicine to trigger or strengthen contractions. This medicine is given through an IV access tube inserted into a vein in your arm. All of the methods of induction, besides stripping the membranes, will be done in the hospital. Induction is done in the hospital so that you and the baby  can be carefully monitored. How long does it take for labor to be induced? Some inductions can take up to 2-3 days. Depending on the cervix, it usually takes less time. It takes longer when you are induced early in the pregnancy or if this is your first pregnancy. If a mother is still pregnant and the induction has been going on for 2-3 days, either the mother will be sent home or a cesarean delivery will be needed. What are the risks associated with labor induction? Some of the risks of induction include:  Changes in fetal heart rate, such as too high, too low, or erratic.  Fetal distress.  Chance of infection for the mother and baby.  Increased chance of having a  cesarean delivery.  Breaking off (abruption) of the placenta from the uterus (rare).  Uterine rupture (very rare). When induction is needed for medical reasons, the benefits of induction may outweigh the risks. What are some reasons for not inducing labor? Labor induction should not be done if:  It is shown that your baby does not tolerate labor.  You have had previous surgeries on your uterus, such as a myomectomy or the removal of fibroids.  Your placenta lies very low in the uterus and blocks the opening of the cervix (placenta previa).  Your baby is not in a head-down position.  The umbilical cord drops down into the birth canal in front of the baby. This could cut off the baby's blood and oxygen supply.  You have had a previous cesarean delivery.  There are unusual circumstances, such as the baby being extremely premature. This information is not intended to replace advice given to you by your health care provider. Make sure you discuss any questions you have with your health care provider. Document Released: 04/07/2007 Document Revised: 04/23/2016 Document Reviewed: 06/15/2013 Elsevier Interactive Patient Education  2017 [employer].
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
## 2017-03-18 NOTE — MAU Note (Signed)
Pt reports she lost her mucus plug and has a steady leaking now of clear fluid. c/o sciatic back pain and contractions. Good fetal movement reported  Was 1.5cm dilated at appointment today.

## 2017-03-22 ENCOUNTER — Other Ambulatory Visit: Payer: Self-pay | Admitting: Family Medicine

## 2017-03-22 ENCOUNTER — Ambulatory Visit (HOSPITAL_COMMUNITY)
Admission: RE | Admit: 2017-03-22 | Discharge: 2017-03-22 | Disposition: A | Discharge status: Home / Self Care | Payer: BC Managed Care – PPO | Source: Ambulatory Visit | Attending: Family Medicine | Admitting: Family Medicine

## 2017-03-22 DIAGNOSIS — O99343 Other mental disorders complicating pregnancy, third trimester: Secondary | ICD-10-CM

## 2017-03-22 DIAGNOSIS — B009 Herpesviral infection, unspecified: Secondary | ICD-10-CM

## 2017-03-22 DIAGNOSIS — Z3A40 40 weeks gestation of pregnancy: Secondary | ICD-10-CM | POA: Insufficient documentation

## 2017-03-22 DIAGNOSIS — O480 Post-term pregnancy: Secondary | ICD-10-CM | POA: Insufficient documentation

## 2017-03-23 ENCOUNTER — Inpatient Hospital Stay (HOSPITAL_COMMUNITY)
Admission: RE | Admit: 2017-03-23 | Discharge: 2017-03-26 | DRG: 774 | Disposition: A | Discharge status: Home / Self Care | Payer: BC Managed Care – PPO | Source: Ambulatory Visit | Attending: Family Medicine | Admitting: Family Medicine

## 2017-03-23 ENCOUNTER — Inpatient Hospital Stay (HOSPITAL_COMMUNITY): Payer: BC Managed Care – PPO | Admitting: Anesthesiology

## 2017-03-23 DIAGNOSIS — O9832 Other infections with a predominantly sexual mode of transmission complicating childbirth: Secondary | ICD-10-CM | POA: Diagnosis present

## 2017-03-23 DIAGNOSIS — A6000 Herpesviral infection of urogenital system, unspecified: Secondary | ICD-10-CM | POA: Diagnosis present

## 2017-03-23 DIAGNOSIS — Z3A41 41 weeks gestation of pregnancy: Secondary | ICD-10-CM | POA: Diagnosis not present

## 2017-03-23 DIAGNOSIS — F319 Bipolar disorder, unspecified: Secondary | ICD-10-CM | POA: Diagnosis present

## 2017-03-23 DIAGNOSIS — O99824 Streptococcus B carrier state complicating childbirth: Secondary | ICD-10-CM | POA: Diagnosis present

## 2017-03-23 DIAGNOSIS — O99344 Other mental disorders complicating childbirth: Secondary | ICD-10-CM | POA: Diagnosis present

## 2017-03-23 DIAGNOSIS — Z87891 Personal history of nicotine dependence: Secondary | ICD-10-CM

## 2017-03-23 DIAGNOSIS — O480 Post-term pregnancy: Secondary | ICD-10-CM | POA: Diagnosis present

## 2017-03-23 LAB — CBC
HCT: 36.8 % (ref 36.0–46.0)
Hemoglobin: 12.7 g/dL (ref 12.0–15.0)
MCH: 31 pg (ref 26.0–34.0)
MCHC: 34.5 g/dL (ref 30.0–36.0)
MCV: 89.8 fL (ref 78.0–100.0)
Platelets: 198 10*3/uL (ref 150–400)
RBC: 4.1 MIL/uL (ref 3.87–5.11)
RDW: 15.2 % (ref 11.5–15.5)
WBC: 8.1 10*3/uL (ref 4.0–10.5)

## 2017-03-23 LAB — TYPE AND SCREEN
ABO/RH(D): B POS
Antibody Screen: NEGATIVE

## 2017-03-23 LAB — RPR: RPR Ser Ql: NONREACTIVE

## 2017-03-23 MED ORDER — EPHEDRINE 5 MG/ML INJ
10.0000 mg | INTRAVENOUS | Status: DC | PRN
  Filled 2017-03-23: qty 2

## 2017-03-23 MED ORDER — TERBUTALINE SULFATE 1 MG/ML IJ SOLN: 0.2500 mg | Freq: Once | INTRAMUSCULAR | Status: DC | PRN

## 2017-03-23 MED ORDER — LACTATED RINGERS IV SOLN: 500.0000 mL | Freq: Once | INTRAVENOUS | Status: DC

## 2017-03-23 MED ORDER — OXYTOCIN 40 UNITS IN LACTATED RINGERS INFUSION - SIMPLE MED: 2.5000 [IU]/h | INTRAVENOUS | Status: DC

## 2017-03-23 MED ORDER — OXYCODONE-ACETAMINOPHEN 5-325 MG PO TABS: 1.0000 | ORAL_TABLET | ORAL | Status: DC | PRN

## 2017-03-23 MED ORDER — LACTATED RINGERS IV SOLN
INTRAVENOUS | Status: DC
  Administered 2017-03-23 (×3): via INTRAVENOUS

## 2017-03-23 MED ORDER — FENTANYL CITRATE (PF) 100 MCG/2ML IJ SOLN
100.0000 ug | INTRAMUSCULAR | Status: DC | PRN
  Administered 2017-03-24: 100 ug via INTRAVENOUS
  Filled 2017-03-23: qty 2

## 2017-03-23 MED ORDER — PENICILLIN G POTASSIUM 5000000 UNITS IJ SOLR
5.0000 10*6.[IU] | Freq: Once | INTRAVENOUS | Status: AC
  Administered 2017-03-23: 5 10*6.[IU] via INTRAVENOUS
  Filled 2017-03-23: qty 5

## 2017-03-23 MED ORDER — PHENYLEPHRINE 40 MCG/ML (10ML) SYRINGE FOR IV PUSH (FOR BLOOD PRESSURE SUPPORT)
80.0000 ug | PREFILLED_SYRINGE | INTRAVENOUS | Status: DC | PRN
  Filled 2017-03-23: qty 5

## 2017-03-23 MED ORDER — OXYTOCIN BOLUS FROM INFUSION: 500.0000 mL | Freq: Once | INTRAVENOUS | Status: DC

## 2017-03-23 MED ORDER — LACTATED RINGERS IV SOLN
500.0000 mL | INTRAVENOUS | Status: DC | PRN
  Administered 2017-03-23: 1000 mL via INTRAVENOUS

## 2017-03-23 MED ORDER — PHENYLEPHRINE 40 MCG/ML (10ML) SYRINGE FOR IV PUSH (FOR BLOOD PRESSURE SUPPORT)
80.0000 ug | PREFILLED_SYRINGE | INTRAVENOUS | Status: DC | PRN
  Filled 2017-03-23: qty 10
  Filled 2017-03-23: qty 5

## 2017-03-23 MED ORDER — ARIPIPRAZOLE 5 MG PO TABS
5.0000 mg | ORAL_TABLET | Freq: Every day | ORAL | Status: DC
  Administered 2017-03-23: 5 mg via ORAL
  Filled 2017-03-23 (×2): qty 1

## 2017-03-23 MED ORDER — ONDANSETRON HCL 4 MG/2ML IJ SOLN
4.0000 mg | Freq: Four times a day (QID) | INTRAMUSCULAR | Status: DC | PRN
  Administered 2017-03-23 (×2): 4 mg via INTRAVENOUS
  Filled 2017-03-23 (×2): qty 2

## 2017-03-23 MED ORDER — LIDOCAINE HCL (PF) 1 % IJ SOLN
INTRAMUSCULAR | Status: DC | PRN
  Administered 2017-03-23 (×2): 6 mL via EPIDURAL

## 2017-03-23 MED ORDER — SOD CITRATE-CITRIC ACID 500-334 MG/5ML PO SOLN: 30.0000 mL | ORAL | Status: DC | PRN

## 2017-03-23 MED ORDER — OXYTOCIN 40 UNITS IN LACTATED RINGERS INFUSION - SIMPLE MED
1.0000 m[IU]/min | INTRAVENOUS | Status: DC
  Administered 2017-03-23 – 2017-03-24 (×2): 2 m[IU]/min via INTRAVENOUS
  Filled 2017-03-23: qty 1000

## 2017-03-23 MED ORDER — DIPHENHYDRAMINE HCL 50 MG/ML IJ SOLN: 12.5000 mg | INTRAMUSCULAR | Status: DC | PRN

## 2017-03-23 MED ORDER — FENTANYL 2.5 MCG/ML BUPIVACAINE 1/10 % EPIDURAL INFUSION (WH - ANES)
14.0000 mL/h | INTRAMUSCULAR | Status: DC | PRN
  Administered 2017-03-23 (×3): 14 mL/h via EPIDURAL
  Filled 2017-03-23 (×4): qty 100

## 2017-03-23 MED ORDER — FLEET ENEMA 7-19 GM/118ML RE ENEM: 1.0000 | ENEMA | RECTAL | Status: DC | PRN

## 2017-03-23 MED ORDER — PENICILLIN G POT IN DEXTROSE 60000 UNIT/ML IV SOLN
3.0000 10*6.[IU] | INTRAVENOUS | Status: DC
  Administered 2017-03-23 (×4): 3 10*6.[IU] via INTRAVENOUS
  Filled 2017-03-23 (×9): qty 50

## 2017-03-23 MED ORDER — LIDOCAINE HCL (PF) 1 % IJ SOLN
30.0000 mL | INTRAMUSCULAR | Status: DC | PRN
  Filled 2017-03-23: qty 30

## 2017-03-23 MED ORDER — MISOPROSTOL 25 MCG QUARTER TABLET
25.0000 ug | ORAL_TABLET | ORAL | Status: DC | PRN
  Administered 2017-03-23: 25 ug via VAGINAL
  Filled 2017-03-23 (×3): qty 1

## 2017-03-23 MED ORDER — OXYCODONE-ACETAMINOPHEN 5-325 MG PO TABS: 2.0000 | ORAL_TABLET | ORAL | Status: DC | PRN

## 2017-03-23 MED ORDER — ACETAMINOPHEN 325 MG PO TABS
650.0000 mg | ORAL_TABLET | ORAL | Status: DC | PRN
  Administered 2017-03-24: 650 mg via ORAL
  Filled 2017-03-23: qty 2

## 2017-03-23 NOTE — Progress Notes (Signed)
Labor Progress Note  S: patient resting comfortably with epidural in place. No complaints, no concerns at this time  O:  BP (!) 105/50   Pulse 92   Temp 98.1 F (36.7 C) (Oral)   Resp 16   Ht 5' (1.524 m)   Wt 69.4 kg (153 lb)   LMP 06/09/2016 (Approximate)   BMI 29.88 kg/m  EFM: 120, moderate variability, +accels, no decels [id number]: 6 Effacement (%): 70, 80 Cervical Position: Middle Station: -1 Presentation: Vertex Exam by:: S Nix RN   A&P: 21 y.o. G2P0010 [redacted]w[redacted]d IOL for postdates #Labor:pitocin #FWB: Category 1 tracing #GBS: pos, PCN  Durenda Hurt, MD Resident Physician 10:03 AM

## 2017-03-23 NOTE — Anesthesia Pain Management Evaluation Note (Signed)
  CRNA Pain Management Visit Note  Patient: Veronica Moore, 21 y.o., female  "Hello I am a member of the anesthesia team at [facility]. We have an anesthesia team available at all times to provide care throughout the hospital, including epidural management and anesthesia for C-section. I don't know your plan for the delivery whether it a natural birth, water birth, IV sedation, nitrous supplementation, doula or epidural, but we want to meet your pain goals."   1.Was your pain managed to your expectations on prior hospitalizations?   No prior hospitalizations  2.What is your expectation for pain management during this hospitalization?     Epidural  3.How can we help you reach that goal?   Record the patient's initial score and the patient's pain goal.   Pain: 0  Pain Goal: 5 [facility] wants you to be able to say your pain was always managed very well.  Laban Emperor 03/23/2017
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
## 2017-03-23 NOTE — Anesthesia Preprocedure Evaluation (Signed)
Anesthesia Evaluation  Patient identified by MRN, date of birth, ID band Patient awake    Reviewed: Allergy & Precautions, H&P , NPO status , Patient's Chart, lab work & pertinent test results  Airway Mallampati: I  TM Distance: >3 FB Neck ROM: full    Dental no notable dental hx.    Pulmonary former smoker,    Pulmonary exam normal        Cardiovascular negative cardio ROS Normal cardiovascular exam Rhythm:regular Rate:Normal     Neuro/Psych negative neurological ROS     GI/Hepatic negative GI ROS, Neg liver ROS,   Endo/Other  negative endocrine ROS  Renal/GU negative Renal ROS  negative genitourinary   Musculoskeletal negative musculoskeletal ROS (+)   Abdominal Normal abdominal exam  (+)   Peds  Hematology negative hematology ROS (+)   Anesthesia Other Findings   Reproductive/Obstetrics (+) Pregnancy                             Anesthesia Physical Anesthesia Plan  ASA: II  Anesthesia Plan: Epidural   Post-op Pain Management:    Induction:   Airway Management Planned:   Additional Equipment:   Intra-op Plan:   Post-operative Plan:   Informed Consent: I have reviewed the patients History and Physical, chart, labs and discussed the procedure including the risks, benefits and alternatives for the proposed anesthesia with the patient or authorized representative who has indicated his/her understanding and acceptance.     Plan Discussed with:   Anesthesia Plan Comments:         Anesthesia Quick Evaluation
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
## 2017-03-23 NOTE — Progress Notes (Signed)
Labor Progress Note  S: Patient resting comfortably with epidural. Discussed AROM and patient agreeable  O:  BP (!) 106/59   Pulse (!) 101   Temp 98.1 F (36.7 C) (Oral)   Resp 16   Ht 5' (1.524 m)   Wt 69.4 kg (153 lb)   LMP 06/09/2016 (Approximate)   BMI 29.88 kg/m  EFM: 125 moderate variability, +accels, no decels [id number]: 6 Effacement (%): 60 Cervical Position: Middle Station: -2 Presentation: Vertex Exam by:: Dr. Hillis Range   A&P: 21 y.o. G2P0010 [redacted]w[redacted]d IOL for postdates #Labor: AROM, continue pitocin titration #FWB: Category 1 tracing #GBS: negative  Durenda Hurt, MD Resident Physician 12:54 PM

## 2017-03-23 NOTE — Anesthesia Procedure Notes (Signed)
Epidural Patient location during procedure: OB Start time: 03/23/2017 4:09 AM End time: 03/23/2017 4:13 AM  Staffing Anesthesiologist: Leilani Able  Preanesthetic Checklist Completed: patient identified, surgical consent, pre-op evaluation, timeout performed, IV checked, risks and benefits discussed and monitors and equipment checked  Epidural Patient position: sitting Prep: site prepped and draped and DuraPrep Patient monitoring: continuous pulse ox and blood pressure Approach: midline Location: L3-L4 Injection technique: LOR air  Needle:  Needle type: Tuohy  Needle gauge: 17 G Needle length: 9 cm and 9 Needle insertion depth: 5 cm cm Catheter type: closed end flexible Catheter size: 19 Gauge Catheter at skin depth: 10 cm Test dose: negative and Other  Assessment Sensory level: T9 Events: blood not aspirated, injection not painful, no injection resistance, negative IV test and no paresthesia  Additional Notes Reason for block:procedure for pain

## 2017-03-23 NOTE — Progress Notes (Signed)
Patient ID: Veronica Moore, female   DOB: [birth year], 21 y.o.   MRN: [id number]  S: Patient seen & examined for progress of labor. Patient comfortable with epidural, but feeling back pain.    O:  Vitals:   03/23/17 1431 03/23/17 1501 03/23/17 1531 03/23/17 1605  BP: 132/68 111/69 111/72   Pulse: (!) 116 (!) 103 (!) 101 (!) 104  Resp: Temp: 98.5 F (36.9 C)   99.1 F (37.3 C)  TempSrc: Oral   Oral  SpO2:    98%  Weight:      Height:        Dilation: 6 Effacement (%): 90 Cervical Position: Middle Station: -2 Presentation: Vertex Exam by:: S Nix RN   FHT: 120 bpm, mod var, +accels, no decels TOCO: q57min  IUPC placed with ease.   A/P: IUPC placed- await to determine if adequate Continue pitocin Continue expectant management Anticipate SVD

## 2017-03-24 ENCOUNTER — Encounter (HOSPITAL_COMMUNITY): Payer: Self-pay

## 2017-03-24 DIAGNOSIS — O480 Post-term pregnancy: Secondary | ICD-10-CM

## 2017-03-24 DIAGNOSIS — Z3A41 41 weeks gestation of pregnancy: Secondary | ICD-10-CM

## 2017-03-24 MED ORDER — ACETAMINOPHEN 325 MG PO TABS
650.0000 mg | ORAL_TABLET | ORAL | Status: DC | PRN
  Administered 2017-03-25 – 2017-03-26 (×2): 650 mg via ORAL
  Filled 2017-03-24 (×2): qty 2

## 2017-03-24 MED ORDER — KETOROLAC TROMETHAMINE 30 MG/ML IJ SOLN: 30.0000 mg | Freq: Four times a day (QID) | INTRAMUSCULAR | Status: DC

## 2017-03-24 MED ORDER — TETANUS-DIPHTH-ACELL PERTUSSIS 5-2.5-18.5 LF-MCG/0.5 IM SUSP: 0.5000 mL | Freq: Once | INTRAMUSCULAR | Status: DC

## 2017-03-24 MED ORDER — COCONUT OIL OIL: 1.0000 "application " | TOPICAL_OIL | Status: DC | PRN

## 2017-03-24 MED ORDER — CEFAZOLIN SODIUM-DEXTROSE 2-4 GM/100ML-% IV SOLN: 2.0000 g | Freq: Once | INTRAVENOUS | Status: DC

## 2017-03-24 MED ORDER — METHYLERGONOVINE MALEATE 0.2 MG/ML IJ SOLN
INTRAMUSCULAR | Status: AC
  Filled 2017-03-24: qty 1

## 2017-03-24 MED ORDER — MISOPROSTOL 200 MCG PO TABS
800.0000 ug | ORAL_TABLET | Freq: Once | ORAL | Status: AC
  Administered 2017-03-24: 800 ug via RECTAL

## 2017-03-24 MED ORDER — DIBUCAINE 1 % RE OINT: 1.0000 "application " | TOPICAL_OINTMENT | RECTAL | Status: DC | PRN

## 2017-03-24 MED ORDER — MISOPROSTOL 200 MCG PO TABS
ORAL_TABLET | ORAL | Status: AC
  Administered 2017-03-24: 800 ug via RECTAL
  Filled 2017-03-24: qty 4

## 2017-03-24 MED ORDER — WITCH HAZEL-GLYCERIN EX PADS: 1.0000 "application " | MEDICATED_PAD | CUTANEOUS | Status: DC | PRN

## 2017-03-24 MED ORDER — SIMETHICONE 80 MG PO CHEW: 80.0000 mg | CHEWABLE_TABLET | ORAL | Status: DC | PRN

## 2017-03-24 MED ORDER — BENZOCAINE-MENTHOL 20-0.5 % EX AERO
1.0000 "application " | INHALATION_SPRAY | CUTANEOUS | Status: DC | PRN
  Administered 2017-03-24: 1 via TOPICAL
  Filled 2017-03-24: qty 56

## 2017-03-24 MED ORDER — ARIPIPRAZOLE 5 MG PO TABS
5.0000 mg | ORAL_TABLET | Freq: Every day | ORAL | Status: DC
  Administered 2017-03-24 – 2017-03-26 (×3): 5 mg via ORAL
  Filled 2017-03-24 (×3): qty 1

## 2017-03-24 MED ORDER — ONDANSETRON HCL 4 MG PO TABS: 4.0000 mg | ORAL_TABLET | ORAL | Status: DC | PRN

## 2017-03-24 MED ORDER — TRAMADOL HCL 50 MG PO TABS
50.0000 mg | ORAL_TABLET | Freq: Four times a day (QID) | ORAL | Status: DC | PRN
  Administered 2017-03-24 – 2017-03-26 (×5): 50 mg via ORAL
  Filled 2017-03-24 (×5): qty 1

## 2017-03-24 MED ORDER — KETOROLAC TROMETHAMINE 30 MG/ML IJ SOLN
30.0000 mg | Freq: Four times a day (QID) | INTRAMUSCULAR | Status: AC
  Administered 2017-03-24 – 2017-03-25 (×3): 30 mg via INTRAVENOUS
  Filled 2017-03-24 (×3): qty 1

## 2017-03-24 MED ORDER — IBUPROFEN 600 MG PO TABS
600.0000 mg | ORAL_TABLET | Freq: Four times a day (QID) | ORAL | Status: DC
  Administered 2017-03-24: 600 mg via ORAL
  Filled 2017-03-24: qty 1

## 2017-03-24 MED ORDER — ONDANSETRON HCL 4 MG/2ML IJ SOLN: 4.0000 mg | INTRAMUSCULAR | Status: DC | PRN
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
## 2017-03-24 NOTE — Progress Notes (Signed)
Patient ID: Veronica Moore, female   DOB: 12-06-1995, 21 y.o.   MRN: [id number] I I was called to the patients room because of a shoulder dystocia and the attending physician was in a stat CS.  When I arrived.  The head was on the perineum and Mc roberts position and suprapubic pressure was applied.  The CNM Renee Rival performed and episiotomy and tried to deliver the posterior arm.   I then tended to the pt and tried to deliver the posterior arm and Wood screw maneuver neither worked.  We then placed the pt on her hands and knees and I was able to deliver the right arm which was the posterior arm.  The baby was still difficult to deliver and had to be placed in lithotomy to be delivered.  The baby was then delivered and handed to the NICU team.  I was told it had been about 4 minutes and 44 seconds that passed until the baby was born.  The infant was resuccitated.  by NICU.  I then went and talked with the parents about all that was done and answered any questions they may have.

## 2017-03-24 NOTE — Lactation Note (Signed)
This note was copied from a baby's chart. Lactation Consultation Note  Patient Name: Veronica Moore Bazaldua [id number] Date: 03/24/2017 Reason for consult: Initial assessment;NICU baby Breastfeeding consultation services and support information given.  Providing Breastmilk For Your Baby In NICU booklet also given for review.  This is mom's first baby and newborn is 4 hours old.  Baby is currently in the NICU.  Discussed importance of pumping/hand expression 8-12 times/24 hours.  Taught mom hand expression and colostrum is flowing freely. Initiated symphony pump.  Encouraged to call with concerns/assist.  Maternal Data Has patient been taught Hand Expression?: Yes Does the patient have breastfeeding experience prior to this delivery?: No  Feeding    LATCH Score/Interventions                      Lactation Tools Discussed/Used Pump Review: Setup, frequency, and cleaning;Milk Storage Initiated by:: LC Date initiated:: 03/25/17   Consult Status Consult Status: Follow-up Date: 03/25/17 Follow-up type: In-patient    Huston Foley 03/24/2017, 10:08 AM
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
## 2017-03-24 NOTE — Anesthesia Postprocedure Evaluation (Signed)
Anesthesia Post Note  Patient: Veronica Moore  Procedure(s) Performed: * No procedures listed *  Patient location during evaluation: Mother Baby Anesthesia Type: Epidural Level of consciousness: awake and alert and oriented Pain management: satisfactory to patient Vital Signs Assessment: post-procedure vital signs reviewed and stable Respiratory status: spontaneous breathing and nonlabored ventilation Cardiovascular status: stable Postop Assessment: no headache, no backache, no signs of nausea or vomiting, adequate PO intake and patient able to bend at knees (patient up walking) Anesthetic complications: no        Last Vitals:  Vitals:   03/24/17 1143 03/24/17 1538  BP: (!) 96/49 118/82  Pulse: 87 99  Resp: 16 16  Temp: 36.9 C 36.9 C    Last Pain:  Vitals:   03/24/17 1700  TempSrc:   PainSc: 3    Pain Goal:                 GREGORY,SUZANNE
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
## 2017-03-24 NOTE — Progress Notes (Signed)
I was alerted about pt's baby in NICU and the difficult delivery in NICU discharge planning.  I spoke with her RN who let me know that pt did not wish to speak at this time.  Chaplain Dyanne Carrel, Bcc Pager, [phone] 4:12 PM    03/24/17 1600  Clinical Encounter Type  Visited With Health care provider

## 2017-03-24 NOTE — Progress Notes (Addendum)
Patient ID: Veronica Moore, female   DOB: 10-Feb-1996, 21 y.o.   MRN: [id number] CTBS to standby for assistance w/ delivery due to prolonged crowning and concern for possible shoulder dystocia. Dr. Adrian Blackwater in OR w/ emergent C/S. After delivery of head turtle sign was noted.  1st maneuver: Attempted delivery of posterior (left) shoulder w/out success. 2nd maneuver: McRoberts 3rd maneuver: Suprapubic pressure. Dr. Normand Sloop arrived in room 4th maneuver: Hooked left axilla and attempted delivery of arm w/out success. NICU team called.  Dr. Normand Sloop took over maneuvers. See note.  CNM again tried to deliver posterior arm as pt was turned to hands and knees. Some movement made, but CNM turned care back over to Dr. Normand Sloop who was able to deliver baby.   Arterial Cord pH 7.31. Collected by Joellyn Haff, CNM  Dorathy Kinsman, CNM 03/24/2017 9:08 AM

## 2017-03-25 LAB — CBC
HCT: 27.3 % — ABNORMAL LOW (ref 36.0–46.0)
Hemoglobin: 9.6 g/dL — ABNORMAL LOW (ref 12.0–15.0)
MCH: 31.5 pg (ref 26.0–34.0)
MCHC: 35.2 g/dL (ref 30.0–36.0)
MCV: 89.5 fL (ref 78.0–100.0)
Platelets: 151 10*3/uL (ref 150–400)
RBC: 3.05 MIL/uL — ABNORMAL LOW (ref 3.87–5.11)
RDW: 15.5 % (ref 11.5–15.5)
WBC: 14.2 10*3/uL — ABNORMAL HIGH (ref 4.0–10.5)

## 2017-03-25 MED ORDER — BISACODYL 10 MG RE SUPP
10.0000 mg | Freq: Once | RECTAL | Status: AC
  Administered 2017-03-25: 10 mg via RECTAL
  Filled 2017-03-25: qty 1

## 2017-03-25 MED ORDER — POLYETHYLENE GLYCOL 3350 17 G PO PACK
17.0000 g | PACK | Freq: Every day | ORAL | Status: DC
  Administered 2017-03-25: 17 g via ORAL
  Filled 2017-03-25: qty 1
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
## 2017-03-25 NOTE — Lactation Note (Signed)
This note was copied from a baby's chart. Lactation Consultation Note  Patient Name: Veronica Moore RUEAV'W Date: 03/25/2017   Attempted to consult with mom several times, but mom sleeping soundly in room. Enc patient's bedside nurse to contact [facility] as needed.   Maternal Data    Feeding    LATCH Score/Interventions                      Lactation Tools Discussed/Used     Consult Status      Sherlyn Hay 03/25/2017, 6:12 PM

## 2017-03-26 ENCOUNTER — Ambulatory Visit: Payer: Self-pay

## 2017-03-26 MED ORDER — ACETAMINOPHEN 325 MG PO TABS: 650.0000 mg | ORAL_TABLET | ORAL | 0 refills | Status: DC | PRN

## 2017-03-26 MED ORDER — POLYETHYLENE GLYCOL 3350 17 G PO PACK: 17.0000 g | PACK | Freq: Every day | ORAL | 0 refills | Status: AC | PRN

## 2017-03-26 NOTE — Lactation Note (Signed)
This note was copied from a baby's chart. Lactation Consultation Note  Patient Name: Veronica Moore LFYBO'F Date: 03/26/2017 Reason for consult: Follow-up assessment  With this mom of a NICU baby, term, on a cooling blanket. I met with mom at the baby's bedside. Mom teary. She asked for mom colostrum snappies, which I gave her. Mom is pumping, and had not further questions at this time.    Maternal Data    Feeding    LATCH Score/Interventions                      Lactation Tools Discussed/Used     Consult Status Consult Status: PRN Follow-up type: In-patient (BICU)    Tonna Corner 03/26/2017, 5:59 PM

## 2017-03-26 NOTE — Discharge Instructions (Signed)
Care of a Perineal Tear °A perineal tear is a cut (laceration) in the tissue between the opening of the vagina and the anus (perineum). Some women naturally develop a perineal tear during a vaginal birth. This can happen as the baby emerges from the birth canal and the perineum is stretched. Perineal tears are graded based on how deep and long the laceration is. The grading for perineal tears is as follows: °· First degree. This involves a shallow tear at the edge of the vaginal opening that extends slightly into the perineal skin. °· Second degree. This involves tearing described in a first degree perineal tear and also a deeper tear of the vaginal opening and perineal tissues. It may also include tearing of a muscle just under the perineal skin. °· Third degree. This involves tearing described in a first and second degree perineal tear, with the tear extending into the muscle of the anus (anal sphincter). °· Fourth degree. This involves all levels of tear described for first, second, and third degree perineal tear, with the tear extending into the rectum. ° °First degree perineal tears may or may not be stitched closed, depending on their location and appearance. Second, third, and fourth degree perineal tears are stitched closed immediately after the baby’s birth. °What are the risks? °Depending on the type of perineal tear you have, you may be at risk for the following: °· Bleeding. °· Developing a collection of blood in the perineal tear area (hematoma). °· Pain. This may include pain with urination or bowel movements. °· Infection at the site of the tear. °· Fever. °· Trouble controlling your bowels (fecal incontinence). °· Painful sexual intercourse. ° °How to care for a perineal tear °· The first day, put ice on the area of the tear. °? Put ice in a plastic bag. °? Place a towel between your skin and the bag. °? Leave the ice on for 20 minutes, 2-3 times a day. °· Bathe using a warm sitz bath as directed by  your health care provider. This can speed up healing. Sitz baths can be performed in your bathtub or using a sitz bath kit that fits over your toilet. °? Place 3-4 in. (7.6-10 cm) of warm water in your bathtub or fill the sitz bath over-the-toilet container with warm water. Make sure the water is not too hot by placing a drop on your wrist. °? Sit in the warm water for 20-30 minutes. °? After bathing, pat your perineum dry with a clean towel. Do not scrub the perineum as this could cause pain, irritation, or open any stitches you may have. °? Keep the over-the-toilet sitz bath container clean by rinsing it thoroughly after each use. Ask for help in keeping the bathtub clean with diluted bleach and water (2 Tbsp [30 mL] of bleach to ½ gal [1.9 L] of water). °? Repeat the sitz bath as often as you would like to relieve perineal pain, itching, or discomfort. °· Apply a numbing spray to the perineal tear site as directed by your health care provider. This may help with discomfort. °· Wash your hands before and after applying medicine to the area. °· Put about 3 witch hazel-containing hemorrhoid treatment pads on top of your sanitary pad. The witch hazel in the hemorrhoid pads helps with discomfort and swelling. °· Get a squeeze bottle to squeeze warm water on your perineum when urinating, spraying the area from front to back. Pat the area to dry it. °· Sitting on an inflatable   ring or pillow may provide comfort. °· Take medicines only as directed by your health care provider. °· Do not have sexual intercourse or use tampons until your health care provider says it is okay. Typically, you must wait at least 6 weeks. °· Keep all postpartum appointments as directed by your health care provider. °Contact a health care provider if: °· Your pain is not relieved with medicines. °· You have painful urination. °· You have a fever. °Get help right away if: °· You have redness, swelling, or increasing pain in the area of the  tear. °· You have pus coming from the area of the tear. °· You notice a bad smell coming from the area of the tear. °· Your tear opens. °· You notice swelling in the area of the tear that is larger than when you left the hospital. °· You cannot urinate. °This information is not intended to replace advice given to you by your health care provider. Make sure you discuss any questions you have with your health care provider. °Document Released: 04/02/2014 Document Revised: 04/29/2016 Document Reviewed: 08/22/2013 °Elsevier Interactive Patient Education © 2017 [employer] °Home Care Instructions for Mom °ACTIVITY °· Gradually return to your regular activities. °· Let yourself rest. Nap while your baby sleeps. °· Avoid lifting anything that is heavier than 10 lb (4.5 kg) until your health care provider says it is okay. °· Avoid activities that take a lot of effort and energy (are strenuous) until approved by your health care provider. Walking at a slow-to-moderate pace is usually safe. °· If you had a cesarean delivery: °? Do not vacuum, climb stairs, or drive a car for 4-6 weeks. °? Have someone help you at home until you feel like you can do your usual activities yourself. °? Do exercises as told by your health care provider, if this applies. ° °VAGINAL BLEEDING °You may continue to bleed for 4-6 weeks after delivery. Over time, the amount of blood usually decreases and the color of the blood usually gets lighter. However, the flow of bright red blood may increase if you have been too active. If you need to use more than one pad in an hour because your pad gets soaked, or if you pass a large clot: °· Lie down. °· Raise your feet. °· Place a cold compress on your lower abdomen. °· Rest. °· Call your health care provider. ° °If you are breastfeeding, your period should return anytime between 8 weeks after delivery and the time that you stop breastfeeding. If you are not breastfeeding, your period should return 6-8  weeks after delivery. °PERINEAL CARE °The perineal area, or perineum, is the part of your body between your thighs. After delivery, this area needs special care. Follow these instructions as told by your health care provider. °· Take warm tub baths for 15-20 minutes. °· Use medicated pads and pain-relieving sprays and creams as told. °· Do not use tampons or douches until vaginal bleeding has stopped. °· Each time you go to the bathroom: °? Use a peri bottle. °? Change your pad. °? Use towelettes in place of toilet paper until your stitches have healed. °· Do Kegel exercises every day. Kegel exercises help to maintain the muscles that support the vagina, bladder, and bowels. You can do these exercises while you are standing, sitting, or lying down. To do Kegel exercises: °? Tighten the muscles of your abdomen and the muscles that surround your birth canal. °? Hold for a few seconds. °?   Relax. °? Repeat until you have done this 5 times in a row. °· To prevent hemorrhoids from developing or getting worse: °? Drink enough fluid to keep your urine clear or pale yellow. °? Avoid straining when having a bowel movement. °? Take over-the-counter medicines and stool softeners as told by your health care provider. ° °BREAST CARE °· Wear a tight-fitting bra. °· Avoid taking over-the-counter pain medicine for breast discomfort. °· Apply ice to the breasts to help with discomfort as needed: °? Put ice in a plastic bag. °? Place a towel between your skin and the bag. °? Leave the ice on for 20 minutes or as told by your health care provider. ° °NUTRITION °· Eat a well-balanced diet. °· Do not try to lose weight quickly by cutting back on calories. °· Take your prenatal vitamins until your postpartum checkup or until your health care provider tells you to stop. ° °POSTPARTUM DEPRESSION °You may find yourself crying for no apparent reason and unable to cope with all of the changes that come with having a newborn. This mood is  called postpartum depression. Postpartum depression happens because your hormone levels change after delivery. If you have postpartum depression, get support from your partner, friends, and family. If the depression does not go away on its own after several weeks, contact your health care provider. °BREAST SELF-EXAM °Do a breast self-exam each month, at the same time of the month. If you are breastfeeding, check your breasts just after a feeding, when your breasts are less full. If you are breastfeeding and your period has started, check your breasts on day 5, 6, or 7 of your period. °Report any lumps, bumps, or discharge to your health care provider. Know that breasts are normally lumpy if you are breastfeeding. This is temporary, and it is not a health risk. °INTIMACY AND SEXUALITY °Avoid sexual activity for at least 3-4 weeks after delivery or until the brownish-red vaginal flow is completely gone. If you want to avoid pregnancy, use some form of birth control. You can get pregnant after delivery, even if you have not had your period. °SEEK MEDICAL CARE IF: °· You feel unable to cope with the changes that a child brings to your life, and these feelings do not go away after several weeks. °· You notice a lump, a bump, or discharge on your breast. ° °SEEK IMMEDIATE MEDICAL CARE IF: °· Blood soaks your pad in 1 hour or less. °· You have: °? Severe pain or cramping in your lower abdomen. °? A bad-smelling vaginal discharge. °? A fever that is not controlled by medicine. °? A fever, and an area of your breast is red and sore. °? Pain or redness in your calf. °? Sudden, severe chest pain. °? Shortness of breath. °? Painful or bloody urination. °? Problems with your vision. °· You vomit for 12 hours or longer. °· You develop a severe headache. °· You have serious thoughts about hurting yourself, your child, or anyone else. ° °This information is not intended to replace advice given to you by your health care provider.  Make sure you discuss any questions you have with your health care provider. °Document Released: 11/13/2000 Document Revised: 04/23/2016 Document Reviewed: 05/20/2015 °Elsevier Interactive Patient Education © 2017 [employer] ° °

## 2017-03-26 NOTE — Discharge Summary (Signed)
OB Discharge Summary     Patient Name: Veronica Moore DOB: 1995/12/17 MRN: [id number]  Date of admission: 03/23/2017 Delivering MD: Jaymes Graff   Date of discharge: 03/26/2017  Admitting diagnosis: INDUCTION Intrauterine pregnancy: [redacted]w[redacted]d     Secondary diagnosis:  Active Problems:   Post-dates pregnancy  Additional problems: 5 minute shoulder dystocia, 2nd degree perineal laceration     Discharge diagnosis: Term Pregnancy Delivered                                                                                                Post partum procedures:none  Augmentation: AROM, Pitocin, Cytotec and Foley Balloon  Complications: shoulder dystocia  Hospital course:  Onset of Labor With Vaginal Delivery     21 y.o. yo G2P1011 at [redacted]w[redacted]d was admitted for IOL for postdates on 03/23/2017. Patient had an uncomplicated labor course as follows:  Membrane Rupture Time/Date: 12:48 PM ,03/23/2017   Intrapartum Procedures: Episiotomy: Median [2]                                         Lacerations:  2nd degree [3]  Patient had a delivery of a Viable infant. 03/24/2017  Information for the patient's newborn:  Sharnetta, Gielow [[id number]]  Delivery Method: Vaginal, Spontaneous Delivery (Filed from Delivery Summary)    Pateint had an uncomplicated postpartum course.  She is ambulating, tolerating a regular diet, passing flatus, and urinating well. Patient is discharged home in stable condition on 03/26/17.   Physical exam  Vitals:   03/24/17 2142 03/25/17 0050 03/25/17 0750 03/25/17 1954  BP: 122/69 127/86 118/74 124/79  Pulse: (!) 110 100 94 91  Resp: Temp: 98.5 F (36.9 C) 98.7 F (37.1 C) 97.8 F (36.6 C) 98.7 F (37.1 C)  TempSrc: Oral Oral Oral Oral  SpO2: 98% 97% 95% 97%  Weight:      Height:       General: alert, cooperative and no distress Lochia: appropriate Uterine Fundus: firm Incision: N/A DVT Evaluation: No evidence of DVT seen on physical  exam. Labs: Lab Results  Component Value Date   WBC 14.2 (H) 03/25/2017   HGB 9.6 (L) 03/25/2017   HCT 27.3 (L) 03/25/2017   MCV 89.5 03/25/2017   PLT 151 03/25/2017   CMP Latest Ref Rng & Units 12/02/2015  Glucose 65 - 99 mg/dL 96  BUN 6 - 20 mg/dL 14  Creatinine 8.46 - 9.62 mg/dL 9.52  Sodium 841 - 324 mmol/L 141  Potassium 3.5 - 5.1 mmol/L 3.9  Chloride 101 - 111 mmol/L 106  CO2 22 - 32 mmol/L 25  Calcium 8.9 - 10.3 mg/dL 9.6  Total Protein 6.5 - 8.1 g/dL 7.9  Total Bilirubin 0.3 - 1.2 mg/dL 0.7  Alkaline Phos 38 - 126 U/L 69  AST 15 - 41 U/L 16  ALT 14 - 54 U/L 14    Discharge instruction: per After Visit Summary and "Baby and Me Booklet".  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    Pineapple Swelling, Other (See Comments)   Reaction:  Mouth/lip swelling       Medication List    TAKE these medications   acetaminophen 325 MG tablet Commonly known as:  TYLENOL Take 2 tablets (650 mg total) by mouth every 4 (four) hours as needed for moderate pain.   acyclovir 400 MG tablet Commonly known as:  ZOVIRAX Take 1 tablet (400 mg total) by mouth 3 (three) times daily.   ARIPiprazole 5 MG tablet Commonly known as:  ABILIFY Take 1 tablet (5 mg total) by mouth daily. Take 1/2 tablet for about a week, then increase to 1 tablet daily What changed:  additional instructions   calcium carbonate 500 MG chewable tablet Commonly known as:  TUMS - dosed in mg elemental calcium Chew 1-2 tablets by mouth every 2 (two) hours as needed for indigestion or heartburn.   ferrous sulfate 325 (65 FE) MG tablet Take 325 mg by mouth daily with breakfast.   polyethylene glycol packet Commonly known as:  MIRALAX / GLYCOLAX Take 17 g by mouth daily as needed for moderate constipation.   prenatal multivitamin Tabs tablet Take 1 tablet by mouth daily at 12 noon. What changed:  when to take  this       Diet: routine diet  Activity: Advance as tolerated. Pelvic rest for 6 weeks.   Outpatient follow up:1 week Follow up Appt:Future Appointments Date Time Provider Department Center  04/19/2017 8:00 AM Burnard Leigh, MD [facility] None   Follow up Visit:No Follow-up on file.  Postpartum contraception: IUD Paragard  Newborn Data: Live born female  Birth Weight: 8 lb 15.9 oz (4080 g) APGAR: 1, 3  Baby Feeding: Bottle and Breast Disposition:NICU   03/26/2017 Durenda Hurt, MD

## 2017-03-26 NOTE — Progress Notes (Signed)
Patient ID: Veronica Moore, female   DOB: Apr 14, 1996, 21 y.o.   MRN: [id number] Met with pt in NICU to see how she's feeling and ask if she has any questions about delivery since this CNM was one of the providers at delivery. Pt expressed gratitude for CNM visit. No questions about delivery, but reports her perineal swelling is much better, is ambulating w/ only minor discomfort. Pt smiling, optimistic about baby's improving status. Would like to see CNM in [place] in 1 week. In-basket massage sent to Admin pool for appt 03/31/17 at 11:00 and contact info given to pt. Will continue Abilify and has F/U appt w/ [facility] in 3 weeks. Support given.   [place], CNM 03/26/2017 2:17 PM
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
## 2017-03-28 ENCOUNTER — Encounter (HOSPITAL_COMMUNITY): Payer: Self-pay | Admitting: *Deleted

## 2017-03-28 ENCOUNTER — Inpatient Hospital Stay (HOSPITAL_COMMUNITY)
Admission: AD | Admit: 2017-03-28 | Discharge: 2017-03-28 | Disposition: A | Discharge status: Home / Self Care | Payer: BC Managed Care – PPO | Source: Ambulatory Visit | Attending: Obstetrics & Gynecology | Admitting: Obstetrics & Gynecology

## 2017-03-28 DIAGNOSIS — N939 Abnormal uterine and vaginal bleeding, unspecified: Secondary | ICD-10-CM | POA: Diagnosis not present

## 2017-03-28 DIAGNOSIS — Z818 Family history of other mental and behavioral disorders: Secondary | ICD-10-CM | POA: Insufficient documentation

## 2017-03-28 DIAGNOSIS — Z915 Personal history of self-harm: Secondary | ICD-10-CM | POA: Insufficient documentation

## 2017-03-28 DIAGNOSIS — Z811 Family history of alcohol abuse and dependence: Secondary | ICD-10-CM | POA: Diagnosis not present

## 2017-03-28 DIAGNOSIS — Z87891 Personal history of nicotine dependence: Secondary | ICD-10-CM | POA: Insufficient documentation

## 2017-03-28 DIAGNOSIS — F4310 Post-traumatic stress disorder, unspecified: Secondary | ICD-10-CM | POA: Insufficient documentation

## 2017-03-28 DIAGNOSIS — F319 Bipolar disorder, unspecified: Secondary | ICD-10-CM | POA: Diagnosis not present

## 2017-03-28 DIAGNOSIS — F909 Attention-deficit hyperactivity disorder, unspecified type: Secondary | ICD-10-CM | POA: Diagnosis not present

## 2017-03-28 DIAGNOSIS — Z79899 Other long term (current) drug therapy: Secondary | ICD-10-CM | POA: Insufficient documentation

## 2017-03-28 LAB — CBC
HCT: 31.4 % — ABNORMAL LOW (ref 36.0–46.0)
Hemoglobin: 10.5 g/dL — ABNORMAL LOW (ref 12.0–15.0)
MCH: 30.2 pg (ref 26.0–34.0)
MCHC: 33.4 g/dL (ref 30.0–36.0)
MCV: 90.2 fL (ref 78.0–100.0)
Platelets: 312 10*3/uL (ref 150–400)
RBC: 3.48 MIL/uL — ABNORMAL LOW (ref 3.87–5.11)
RDW: 14.6 % (ref 11.5–15.5)
WBC: 10.2 10*3/uL (ref 4.0–10.5)

## 2017-03-28 LAB — COMPREHENSIVE METABOLIC PANEL
ALT: 26 U/L (ref 14–54)
AST: 26 U/L (ref 15–41)
Albumin: 2.8 g/dL — ABNORMAL LOW (ref 3.5–5.0)
Alkaline Phosphatase: 116 U/L (ref 38–126)
Anion gap: 7 (ref 5–15)
BUN: 17 mg/dL (ref 6–20)
CO2: 24 mmol/L (ref 22–32)
Calcium: 9.2 mg/dL (ref 8.9–10.3)
Chloride: 105 mmol/L (ref 101–111)
Creatinine, Ser: 0.69 mg/dL (ref 0.44–1.00)
GFR calc Af Amer: >60 mL/min (ref >60)
GFR calc non Af Amer: >60 mL/min (ref >60)
Glucose, Bld: 98 mg/dL (ref 65–99)
Potassium: 4.6 mmol/L (ref 3.5–5.1)
Sodium: 136 mmol/L (ref 135–145)
Total Bilirubin: 0.3 mg/dL (ref 0.3–1.2)
Total Protein: 6.5 g/dL (ref 6.5–8.1)

## 2017-03-28 MED ORDER — ARIPIPRAZOLE 5 MG PO TABS
5.0000 mg | ORAL_TABLET | Freq: Once | ORAL | Status: AC
  Administered 2017-03-28: 5 mg via ORAL
  Filled 2017-03-28: qty 1

## 2017-03-28 NOTE — MAU Provider Note (Signed)
History     CSN: [id number]  Arrival date and time: 03/28/17 5621   First Provider Initiated Contact with Patient 03/28/17 2115      Chief Complaint  Patient presents with  . Vaginal Bleeding   Veronica Moore is a  21 y.o. G2P1011 who is S/P NSVD on 03/24/17. She states that today she had normal bleeding, but then after a pumping session she passed a clot about the size of an apple. She had a small gush of bleeding afterwards, but states that her bleeding is normal now. She denies any pain. She reports feeling anxious and "jittery" today. She forgot her dose of Abilify today. She denies any HA, visual disturbance or RUQ pain.    Vaginal Bleeding  This is a new problem. The current episode started today. The problem occurs intermittently. The problem has been unchanged. The patient is experiencing no pain. She is not pregnant. The vaginal discharge was bloody. The vaginal bleeding is typical of menses. She has been passing clots (one clot about the size of an apple ). Nothing aggravates the symptoms. She has tried nothing for the symptoms.   Past Medical History:  Diagnosis Date  . ADHD (attention deficit hyperactivity disorder)   . Alcohol abuse, in remission   . Allergy   . Benzodiazepine abuse in remission   . Bipolar depression (HCC)   . Bipolar disorder (HCC)   . PTSD (post-traumatic stress disorder)   . PTSD (post-traumatic stress disorder)   . Suicide threat or attempt    history of SI and history of attempt 2015    Past Surgical History:  Procedure Laterality Date  . NO PAST SURGERIES      Family History  Problem Relation Age of Onset  . ADD / ADHD Mother   . Alcohol abuse Mother   . Anxiety disorder Mother   . Bipolar disorder Mother   . Drug abuse Mother   . Seizures Mother   . Sexual abuse Mother   . ADD / ADHD Father   . Alcohol abuse Father   . Anxiety disorder Father   . Bipolar disorder Father   . Depression Father   . Drug abuse Father   . ADD / ADHD  Sister   . ADD / ADHD Brother   . Dementia Maternal Grandmother   . Seizures Cousin   . Schizophrenia Cousin   . ADD / ADHD Sister     Social History  Substance Use Topics  . Smoking status: Former Smoker    Packs/day: 1.00    Years: 8.00    Types: Cigarettes    Quit date: 06/13/2016  . Smokeless tobacco: Never Used  . Alcohol use No    Allergies:  Allergies  Allergen Reactions  . Red Dye Anaphylaxis and Other (See Comments)    Pt is allergic to red dye 40.   Marland Kitchen Pineapple Swelling and Other (See Comments)    Reaction:  Mouth/lip swelling     Prescriptions Prior to Admission  Medication Sig Dispense Refill Last Dose  . acetaminophen (TYLENOL) 325 MG tablet Take 2 tablets (650 mg total) by mouth every 4 (four) hours as needed for moderate pain. 30 tablet 0 03/28/2017 at Unknown time  . acyclovir (ZOVIRAX) 400 MG tablet Take 1 tablet (400 mg total) by mouth 3 (three) times daily. 60 tablet 1 Past Week at Unknown time  . ARIPiprazole (ABILIFY) 5 MG tablet Take 1 tablet (5 mg total) by mouth daily. Take 1/2 tablet for about  a week, then increase to 1 tablet daily (Patient taking differently: Take 5 mg by mouth daily. ) 90 tablet 0 03/27/2017 at Unknown time  . ferrous sulfate 325 (65 FE) MG tablet Take 325 mg by mouth daily with breakfast.    Past Month at Unknown time  . polyethylene glycol (MIRALAX / GLYCOLAX) packet Take 17 g by mouth daily as needed for moderate constipation. 14 each 0 03/27/2017 at Unknown time  . Prenatal Vit-Fe Fumarate-FA (PRENATAL MULTIVITAMIN) TABS tablet Take 1 tablet by mouth daily at 12 noon. (Patient taking differently: Take 1 tablet by mouth daily. ) 90 tablet 1 Past Week at Unknown time  . calcium carbonate (TUMS - DOSED IN MG ELEMENTAL CALCIUM) 500 MG chewable tablet Chew 1-2 tablets by mouth every 2 (two) hours as needed for indigestion or heartburn.   More than a month at Unknown time    Review of Systems  Genitourinary: Positive for vaginal bleeding.    Physical Exam   Blood pressure 140/88, pulse 95, temperature 98.9 F (37.2 C), temperature source Oral, resp. rate 16, height 5' (1.524 m), weight 141 lb (64 kg), last menstrual period 06/09/2016, SpO2 99 %, unknown if currently breastfeeding.  Physical Exam  Nursing note and vitals reviewed. Constitutional: She is oriented to person, place, and time. She appears well-developed and well-nourished. No distress.  HENT:  Head: Normocephalic.  Cardiovascular: Normal rate.   Respiratory: Effort normal.  GI: Soft. There is no tenderness. There is no rebound.  Genitourinary:  Genitourinary Comments: Fundus firm, about midway b/w umbilicus and SP  Bleeding scant   Neurological: She is alert and oriented to person, place, and time.  Skin: Skin is warm and dry.  Psychiatric: She has a normal mood and affect.   Results for orders placed or performed during the hospital encounter of 03/28/17 (from the past 24 hour(s))  CBC     Status: Abnormal   Collection Time: 03/28/17  9:04 PM  Result Value Ref Range   WBC 10.2 4.0 - 10.5 K/uL   RBC 3.48 (L) 3.87 - 5.11 MIL/uL   Hemoglobin 10.5 (L) 12.0 - 15.0 g/dL   HCT 16.1 (L) 09.6 - 04.5 %   MCV 90.2 78.0 - 100.0 fL   MCH 30.2 26.0 - 34.0 pg   MCHC 33.4 30.0 - 36.0 g/dL   RDW 40.9 81.1 - 91.4 %   Platelets 312 150 - 400 K/uL  Comprehensive metabolic panel     Status: Abnormal   Collection Time: 03/28/17  9:04 PM  Result Value Ref Range   Sodium 136 135 - 145 mmol/L   Potassium 4.6 3.5 - 5.1 mmol/L   Chloride 105 101 - 111 mmol/L   CO2 24 22 - 32 mmol/L   Glucose, Bld 98 65 - 99 mg/dL   BUN 17 6 - 20 mg/dL   Creatinine, Ser 7.82 0.44 - 1.00 mg/dL   Calcium 9.2 8.9 - 95.6 mg/dL   Total Protein 6.5 6.5 - 8.1 g/dL   Albumin 2.8 (L) 3.5 - 5.0 g/dL   AST 26 15 - 41 U/L   ALT 26 14 - 54 U/L   Alkaline Phosphatase 116 38 - 126 U/L   Total Bilirubin 0.3 0.3 - 1.2 mg/dL   GFR calc non Af Amer >60 >60 mL/min   GFR calc Af Amer >60 >60 mL/min    Anion gap 7 5 - 15     MAU Course  Procedures  MDM Patient given her dose of  abilify that was missed.  Labs normal , no PIH symptoms.  Reassured about bleeding, questions answered to patient's satisfaction.  Assessment and Plan   1. Vagina bleeding   2. Postpartum hemorrhage, unspecified type    DC home Comfort measures reviewed  Bleeding precautions RX: none  Return to MAU as needed FU with OB as planned  Follow-up Information    [facility] Follow up.   Specialty:  Obstetrics and Gynecology Contact information: [address] [place] [address] [phone]           Thressa Sheller 03/28/2017, 10:38 PM

## 2017-03-28 NOTE — MAU Note (Signed)
Pt here with vaginal bleeding and clots, one the size of an apple; delivered on 4/25 with shoulder dystocia. Has been dizzy the last hour.

## 2017-03-28 NOTE — Discharge Instructions (Signed)
Postpartum Hemorrhage Postpartum hemorrhage is excessive blood loss after childbirth. Vaginal bleeding after delivery is normal and should be expected. Bleeding (lochia) will occur for several days after childbirth. This can be expected with normal vaginal deliveries and cesarean deliveries. However, postpartum hemorrhage is a potentially serious condition. What are the causes? This condition is caused by:  A loss of muscle tone in the uterus after childbirth. This can be caused by:  An abnormal placenta.  Infection.  Bladder swelling (distension).  Failure to deliver all of the placenta or the retention of clots.  Wounds in the birth canal caused by delivery of the fetus.  Infection of the uterus.  Infection of tissue around the fetus.  A tear in the uterus.  Tearing of the vagina or cervix during delivery.  A maternal bleeding disorder that prevents blood from clotting (rare). What increases the risk? This condition is likely to develop in people who:  Have a history of postpartum hemorrhage.  Had a delivery that lasted longer than usual.  Have an excess of amniotic fluid in the amniotic sac (polyhydramnios), leading the uterus to stretch too much.  Have delivered quintuplets or more babies.  Had high blood pressure, seizures, or coma during pregnancy.  Had a condition called preeclampsia or eclampsia during pregnancy.  Had problems with the placenta.  Had complications during labor or delivery.  Are obese.  Are 22 years old or older.  Are Asian or Hispanic. What are the signs or symptoms? Symptoms of this condition include:  Passing large clots or pieces of tissue. These may be small pieces of placenta left after delivery.  Soaking more than one sanitary pad per hour for several hours.  Heavy, bright-red bleeding that occurs 4 days or more after delivery.  Discharge that has a bad smell.  An unexplained fever.  Nausea or vomiting.  Pain or swelling  near the vagina or perineum.  A drop in blood pressure.  Lightheadedness or fainting.  Shortness of breath.  A fast heart rate that happens with very little activity.  Signs of shock, such as:  Blurry vision.  Chills.  Dizziness.  Weakness. How is this diagnosed? This condition may be diagnosed based on:  Your symptoms.  A physical exam of your perineum, vagina, cervix, and uterus.  Tests, including:  Blood pressure and pulse measurements.  Blood tests.  Blood clotting tests.  Ultrasonography. How is this treated? Treatment for this condition depends on the severity of your symptoms. It may include:  Uterine massage.  Medicines to help the uterus contract.  Blood transfusions.  Fluids given through the vein.  A medical procedure to compress arteries supplying the uterus. Sometimes bleeding occurs if portions of the placenta are left behind in the uterus after delivery. If this happens, a curettage or scraping of the inside of the uterus must be done (rare). This usually stops the bleeding. If curettage does not stop the bleeding, surgery may be done to remove the uterus (hysterectomy), but this rarely occurs. If bleeding is due to clotting or bleeding problems that are not related to the pregnancy, other treatments may be needed. Follow these instructions at home:  Limit your activity as directed by your health care provider.Your health care provider may order bed rest (getting up to go to the bathroom only) or may allow you to continue light activity.  Keep track of the number of pads you use each day and how soaked (saturated) they are. Write down this information.  Do not  use tampons.  Do not douche or have sexual intercourse until your health care provider approves.  Drink enough fluids to keep your urine clear or pale yellow.  Get enough rest.  Eat foods that are rich in iron, such as spinach, red meat, and legumes.  Take any over-the-counter and  prescription medicines only as told by your health care provider.  Keep all follow-up visits as told by your health care provider. This is important. Get help right away if:  You experience severe cramps in your stomach, back, or abdomen.  You have a fever.  You pass large clots or tissue. Save any tissue for your health care provider to look at.  Your bleeding increases.  You have heavy bleeding that soaks one pad per hour for 2 hours in a row.  You faint or become dizzy, weak, or lightheaded.  Your sanitary pad count per hour is increasing.  You are urinating less than usual or not at all.  You have shortness of breath.  Your heart rate is faster than usual.  You have sudden chest pain. This information is not intended to replace advice given to you by your health care provider. Make sure you discuss any questions you have with your health care provider. Document Released: 02/06/2004 Document Revised: 07/15/2016 Document Reviewed: 06/19/2016 Elsevier Interactive Patient Education  2017 [employer].

## 2017-03-31 ENCOUNTER — Ambulatory Visit (INDEPENDENT_AMBULATORY_CARE_PROVIDER_SITE_OTHER): Payer: BC Managed Care – PPO | Admitting: Advanced Practice Midwife

## 2017-03-31 ENCOUNTER — Ambulatory Visit (INDEPENDENT_AMBULATORY_CARE_PROVIDER_SITE_OTHER): Payer: BC Managed Care – PPO | Admitting: Clinical

## 2017-03-31 ENCOUNTER — Encounter: Payer: Self-pay | Admitting: Advanced Practice Midwife

## 2017-03-31 VITALS — BP 134/88 | HR 90 | Wt 135.6 lb

## 2017-03-31 DIAGNOSIS — R69 Illness, unspecified: Secondary | ICD-10-CM

## 2017-03-31 DIAGNOSIS — F329 Major depressive disorder, single episode, unspecified: Secondary | ICD-10-CM | POA: Diagnosis not present

## 2017-03-31 DIAGNOSIS — Z1389 Encounter for screening for other disorder: Secondary | ICD-10-CM | POA: Diagnosis not present

## 2017-03-31 DIAGNOSIS — F419 Anxiety disorder, unspecified: Secondary | ICD-10-CM | POA: Diagnosis not present

## 2017-03-31 DIAGNOSIS — F515 Nightmare disorder: Secondary | ICD-10-CM

## 2017-03-31 DIAGNOSIS — F32A Depression, unspecified: Secondary | ICD-10-CM
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
## 2017-03-31 NOTE — BH Specialist Note (Signed)
Integrated Behavioral Health Initial Visit  MRN: [id number] Name: Veronica Moore   Session Start time: 1:05 Session End time: 1:10 Total time: 5 minutes  Type of Service: Integrated Behavioral Health- Individual/Family Interpretor:No. Interpretor Name and Language: n/a   Warm Hand Off Completed.       SUBJECTIVE: Veronica Moore is a 21 y.o. female accompanied by patient. 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 Patient reports the following symptoms/concerns: Pt states that she wants to come back and talk further, at next visit; open to receiving information about Mom Talk support group for new mothers postpartum. Duration of problem: One week; Severity of problem: moderate  OBJECTIVE: Mood: Appropriate and Affect: Appropriate Risk of harm to self or others: No plan to harm self or others   LIFE CONTEXT: Family and Social: - School/Work: - Self-Care: - Life Changes: Recent traumatic childbirth  GOALS ADDRESSED: Patient will reduce symptoms of:  Increase adequate support systems for patient/family today; will address additional goals at next visit   INTERVENTIONS: Link to [employer]  Standardized Assessments completed: GAD-7 and PHQ 9  ASSESSMENT: Patient currently experiencing Diagnosis deferred. Patient may benefit from postpartum visit with [facility], prior to medical postpartum visit.  PLAN: 1. Follow up with behavioral health clinician on : One week, or earlier, as needed 2. Behavioral recommendations:  -Consider Mom Talk and Breastfeeding support group next Tuesday, 04-06-17, at 10am/11am, [employer] 3. Referral(s): Integrated Art [facility] (In Clinic) and [employer]:  Mom Talk support group 4. "From scale of 1-10, how likely are you to follow plan?": 7  Rae Lips, LCSWA   Depression screen [facility] 2/9 03/31/2017 03/03/2017 02/24/2017 02/10/2017 12/28/2016  Decreased Interest 1 0 0 0 0  Down,  Depressed, Hopeless 1 0 0 0 1  PHQ - 2 Score 2 0 0 0 1  Altered sleeping 2 - - 1 1  Tired, decreased energy 3 - - 1 1  Change in appetite 3 - - 1 1  Feeling bad or failure about yourself  3 - - 0 1  Trouble concentrating 2 - - 0 0  Moving slowly or fidgety/restless 3 - - 0 0  Suicidal thoughts 0 - - - 0  PHQ-9 Score 18 - - 3 5  Difficult doing work/chores - - - - -   GAD 7 : Generalized Anxiety Score 03/31/2017  Nervous, Anxious, on Edge 3  Control/stop worrying 2  Worry too much - different things 2  Trouble relaxing 2  Restless 3  Easily annoyed or irritable 2  Afraid - awful might happen 2  Total GAD 7 Score 16

## 2017-03-31 NOTE — Patient Instructions (Signed)
Postpartum Depression and Baby Blues The postpartum period begins right after the birth of a baby. During this time, there is often a great amount of joy and excitement. It is also a time of many changes in the life of the parents. Regardless of how many times a mother gives birth, each child brings new challenges and dynamics to the family. It is not unusual to have feelings of excitement along with confusing shifts in moods, emotions, and thoughts. All mothers are at risk of developing postpartum depression or the "baby blues." These mood changes can occur right after giving birth, or they may occur many months after giving birth. The baby blues or postpartum depression can be mild or severe. Additionally, postpartum depression can go away rather quickly, or it can be a long-term condition. What are the causes? Raised hormone levels and the rapid drop in those levels are thought to be a main cause of postpartum depression and the baby blues. A number of hormones change during and after pregnancy. Estrogen and progesterone usually decrease right after the delivery of your baby. The levels of thyroid hormone and various cortisol steroids also rapidly drop. Other factors that play a role in these mood changes include major life events and genetics. What increases the risk? If you have any of the following risks for the baby blues or postpartum depression, know what symptoms to watch out for during the postpartum period. Risk factors that may increase the likelihood of getting the baby blues or postpartum depression include:  Having a personal or family history of depression.  Having depression while being pregnant.  Having premenstrual mood issues or mood issues related to oral contraceptives.  Having a lot of life stress.  Having marital conflict.  Lacking a social support network.  Having a baby with special needs.  Having health problems, such as diabetes.  What are the signs or  symptoms? Symptoms of baby blues include:  Brief changes in mood, such as going from extreme happiness to sadness.  Decreased concentration.  Difficulty sleeping.  Crying spells, tearfulness.  Irritability.  Anxiety.  Symptoms of postpartum depression typically begin within the first month after giving birth. These symptoms include:  Difficulty sleeping or excessive sleepiness.  Marked weight loss.  Agitation.  Feelings of worthlessness.  Lack of interest in activity or food.  Postpartum psychosis is a very serious condition and can be dangerous. Fortunately, it is rare. Displaying any of the following symptoms is cause for immediate medical attention. Symptoms of postpartum psychosis include:  Hallucinations and delusions.  Bizarre or disorganized behavior.  Confusion or disorientation.  How is this diagnosed? A diagnosis is made by an evaluation of your symptoms. There are no medical or lab tests that lead to a diagnosis, but there are various questionnaires that a health care provider may use to identify those with the baby blues, postpartum depression, or psychosis. Often, a screening tool called the Edinburgh Postnatal Depression Scale is used to diagnose depression in the postpartum period. How is this treated? The baby blues usually goes away on its own in 1-2 weeks. Social support is often all that is needed. You will be encouraged to get adequate sleep and rest. Occasionally, you may be given medicines to help you sleep. Postpartum depression requires treatment because it can last several months or longer if it is not treated. Treatment may include individual or group therapy, medicine, or both to address any social, physiological, and psychological factors that may play a role in the   depression. Regular exercise, a healthy diet, rest, and social support may also be strongly recommended. Postpartum psychosis is more serious and needs treatment right away.  Hospitalization is often needed. Follow these instructions at home:  Get as much rest as you can. Nap when the baby sleeps.  Exercise regularly. Some women find yoga and walking to be beneficial.  Eat a balanced and nourishing diet.  Do little things that you enjoy. Have a cup of tea, take a bubble bath, read your favorite magazine, or listen to your favorite music.  Avoid alcohol.  Ask for help with household chores, cooking, grocery shopping, or running errands as needed. Do not try to do everything.  Talk to people close to you about how you are feeling. Get support from your partner, family members, friends, or other new moms.  Try to stay positive in how you think. Think about the things you are grateful for.  Do not spend a lot of time alone.  Only take over-the-counter or prescription medicine as directed by your health care provider.  Keep all your postpartum appointments.  Let your health care provider know if you have any concerns. Contact a health care provider if: You are having a reaction to or problems with your medicine. Get help right away if:  You have suicidal feelings.  You think you may harm the baby or someone else. This information is not intended to replace advice given to you by your health care provider. Make sure you discuss any questions you have with your health care provider. Document Released: 08/20/2004 Document Revised: 04/23/2016 Document Reviewed: 08/28/2013 Elsevier Interactive Patient Education  2017 Elsevier Inc.  

## 2017-03-31 NOTE — Progress Notes (Signed)
Patient and/or legal guardian verbally consented to meet with Behavioral Health Clinician about presenting concerns.   

## 2017-04-02 ENCOUNTER — Ambulatory Visit: Payer: Self-pay

## 2017-04-02 NOTE — Lactation Note (Signed)
This note was copied from a baby's chart. Lactation Consultation Note  Patient Name: Veronica Moore BJYNW'GToday's Date: 04/02/2017 Reason for consult: Follow-up assessment;NICU baby   With this mom of a NICU term baby, now 389 days old. I was called to help mom with latching the baby. The baby has a limp and sore right arm, so I helped mom reposition the baby in football hold to her right breast, . He latched easily, with lots of audible swallows. He unlatched after 15 minutes, mom burped him and relatched him by herself. Mom was so happy that she was able to latch him independently. Mom knows to call for questions/concerns.    Maternal Data    Feeding Feeding Type: Breast Fed Length of feed:  (still latched when I left the room)  LATCH Score/Interventions Latch: Grasps breast easily, tongue down, lips flanged, rhythmical sucking.  Audible Swallowing: Spontaneous and intermittent  Type of Nipple: Everted at rest and after stimulation  Comfort (Breast/Nipple): Soft / non-tender     Hold (Positioning): Assistance needed to correctly position infant at breast and maintain latch. Intervention(s): Breastfeeding basics reviewed;Support Pillows;Position options;Skin to skin  LATCH Score: 9  Lactation Tools Discussed/Used     Consult Status Consult Status: PRN Follow-up type:  (NICU)    Alfred LevinsLee, Veronica Moore 04/02/2017, 1:34 PM

## 2017-04-03 ENCOUNTER — Telehealth (HOSPITAL_COMMUNITY): Payer: Self-pay

## 2017-04-03 NOTE — Addendum Note (Signed)
Addended by: Dorathy KinsmanSMITH, [place] on: 04/03/2017 12:03 AM   Modules accepted: Level of Service

## 2017-04-03 NOTE — Telephone Encounter (Signed)
Pt concerned because she was producing 4-5 oz per session now barely 3 oz. Pumps for 15 minutes. Sherilyn CooterHenry is in the NICU and was born at 7641 weeks. He eats 80 ml every 3-4 hours. Encouraged using the "let-down" button a second  Time during the session, pump 20 minutes and use hands on pumping. OP scheduled for Monday with Thayer Ohmhris.

## 2017-04-05 ENCOUNTER — Ambulatory Visit (HOSPITAL_COMMUNITY)
Admission: RE | Admit: 2017-04-05 | Discharge: 2017-04-05 | Disposition: A | Discharge status: Home / Self Care | Payer: BC Managed Care – PPO | Source: Ambulatory Visit | Attending: Advanced Practice Midwife | Admitting: Advanced Practice Midwife

## 2017-04-05 ENCOUNTER — Ambulatory Visit: Payer: Self-pay

## 2017-04-05 DIAGNOSIS — Z029 Encounter for administrative examinations, unspecified: Secondary | ICD-10-CM | POA: Insufficient documentation

## 2017-04-05 NOTE — Lactation Note (Signed)
This note was copied from a baby's chart. Lactation Consultation Note  Patient Name: Boy Alfonse RasSydney Gavina WUJWJ'XToday's Date: 04/05/2017 Reason for consult: Follow-up assessment;NICU baby   Follow up with mom in the NICU prior to d/c. Mom is concerned infant is not taking as much volume and lost some weight yesterday. Infant is ad lib demand. Mom is pumping 8-10 oz /pumping. Enc mom to offer infant hind milk for now and to save foremilk at this time. Mom reports she is BF infant a few times a day. Enc mom to pump off some foremilk before putting infant to breast. Enc mom to maintain pumping until infant transferring milk and gaining weight, mom voiced understanding.   FOB was angry that infant may not go home since he lost weight and voiced that infant is a big baby and why are we so concerned. Enc parents to ask these questions of the Neo/NNP.   Mom just returned from seeing LC OP, she reports she thought that was what she was supposed to do. Discussed that I had stopped by earlier to work with mom and mom was not in the room, she was with LC downstairs.   Offered mom OP appt to take infant to, mom declined and said she would call back for appt. Mom without further questions/concerns. Report to Valentina ShaggyFairy Coleman, NNP and Dr. Algis Downs.    Maternal Data Formula Feeding for Exclusion: No Has patient been taught Hand Expression?: Yes Does the patient have breastfeeding experience prior to this delivery?: No  Feeding    LATCH Score/Interventions                      Lactation Tools Discussed/Used Initiated by:: Reviewed   Consult Status Consult Status: PRN Follow-up type: Call as needed    Ed BlalockSharon S Hice 04/05/2017, 10:19 AM

## 2017-04-05 NOTE — Lactation Note (Signed)
Lactation Consult   Mother's reason for visit:  Pumping assistance Visit Type:  OP Appointment Notes:  Patient is here today because she feels her MS has decreased.  Showed her pumping techniques to increase output with positive results. Pt commented that her breasts were softer and emptier than ever.  Explained that draining them well will help with increasing her MS.  Baby continues to be in NICU and is BF about once a day.  She plans to schedule an OP appointment after baby is discharged. Consult:  Initial Lactation Consultant:  Soyla DryerJoseph, Maryann  ________________________________________________________________________    ________________________________________________________________________  Mother's Name: Veronica Moore Type of delivery:  Vaginal Breastfeeding Experience:  First baby Maternal Medical Conditions:  See history Maternal Medications:  abilify,PNV, Tylenol  ________________________________________________________________________ *   ________________________________________________________________________  Maternal Breast Assessment  Breast:  Full Nipple:  Erect Pain level:  0 Pain interventions:  NA  _______________________________________________________________________

## 2017-04-07 ENCOUNTER — Ambulatory Visit (INDEPENDENT_AMBULATORY_CARE_PROVIDER_SITE_OTHER): Payer: BC Managed Care – PPO | Admitting: Clinical

## 2017-04-07 DIAGNOSIS — F4323 Adjustment disorder with mixed anxiety and depressed mood: Secondary | ICD-10-CM

## 2017-04-07 NOTE — BH Specialist Note (Signed)
Integrated Behavioral Health Follow Up Visit  MRN: [id number] Name: Veronica Moore   Session Start time: 9:00 Session End time: 9:30 Total time: 30 minutes Number of Integrated Behavioral Health Clinician visits: 2/10 First "visit" only 5 minute introduction  Type of Service: Integrated Behavioral Health- Individual/Family Interpretor:No. Interpretor Name and Language: n/a   Warm Hand Off Completed.       SUBJECTIVE: Veronica Moore is a 21 y.o. female accompanied by patient. Patient was referred by Dorathy KinsmanVirginia Smith, CNM for depression and anxiety. Patient reports the following symptoms/concerns: Pt states her primary concern today is adjusting her expectations of motherhood to reality, is forgetting to eat, feels like a bad mother if she starts to fall asleep during the day("like I should be getting things done"), forgetful, and overwhelmingly tired. Duration of problem: Two weeks; Severity of problem: moderate  OBJECTIVE: Mood: Anxious and Depressed and Affect: Depressed and Visibly tired Risk of harm to self or others: No plan to harm self or others   LIFE CONTEXT: Family and Social: Lives with husband and newborn son School/Work: On maternity leave Self-Care: - Life Changes: Recent childbirth  GOALS ADDRESSED: Patient will reduce symptoms of: anxiety and depression and increase knowledge and/or ability of: coping skills and also: Increase healthy adjustment to current life circumstances  INTERVENTIONS: Solution-Focused Strategies and Psychoeducation and/or Health Education Standardized Assessments completed: GAD-7 and PHQ 9  ASSESSMENT: Patient currently experiencing Adjustment disorder with mixed anxious and depressed mood. Patient may benefit from psychoeducation and brief therapeutic intervention regarding coping with symptoms of anxiety and depression.  PLAN: 1. Follow up with behavioral health clinician on : Two weeks, 1 week phone check 2. Behavioral  recommendations:  -Take a nap today, at baby's next nap -Sleep when baby sleeps, as much as able, for the next week -Keep list of tasks in kitchen, for friends/family to complete, when they inquire about helping -Consider placing water bottle and healthy snack in a basket next to couch and/or bed, daily -Consider sleep app for improved family sleep tonight; use as long as it remains helpful -Attend Mom Talk support group, [facility] -Read educational material regarding coping with symptoms of anxiety and depression 3. Referral(s): [facility] ([facility])  Valetta CloseJamie C La Mesilla[place]Mannes, [place]WA  Dep[facility] 2/[facility] 03/31/2017 03/03/2017 02/24/2017 02/10/2017 12/28/2016  Decreased Interest 1 0 0 0 0  Down, Depressed, Hopeless 1 0 0 0 1  PHQ - 2 Score 2 0 0 0 1  Altered sleeping 2 - - 1 1  Tired, decreased energy 3 - - 1 1  Change in appetite 3 - - 1 1  Feeling bad or failure about yourself  3 - - 0 1  Trouble concentrating 2 - - 0 0  Moving slowly or fidgety/restless 3 - - 0 0  Suicidal thoughts 0 - - - 0  PHQ-9 Score 18 - - 3 5  Difficult doing work/chores - - - - -   GAD 7 : Generalized Anxiety Score 03/31/2017  Nervous, Anxious, on Edge 3  Control/stop worrying 2  Worry too much - different things 2  Trouble relaxing 2  Restless 3  Easily annoyed or irritable 2  Afraid - awful might happen 2  Total GAD 7 Score 16

## 2017-04-18 ENCOUNTER — Ambulatory Visit (HOSPITAL_COMMUNITY)
Admission: EM | Admit: 2017-04-18 | Discharge: 2017-04-18 | Disposition: A | Discharge status: Nursing Home (Includes ICF) | Payer: BC Managed Care – PPO

## 2017-04-18 ENCOUNTER — Inpatient Hospital Stay (HOSPITAL_COMMUNITY)
Admission: AD | Admit: 2017-04-18 | Discharge: 2017-04-18 | Disposition: A | Discharge status: Home / Self Care | Payer: BC Managed Care – PPO | Source: Ambulatory Visit | Attending: Obstetrics & Gynecology | Admitting: Obstetrics & Gynecology

## 2017-04-18 DIAGNOSIS — F1011 Alcohol abuse, in remission: Secondary | ICD-10-CM | POA: Diagnosis not present

## 2017-04-18 DIAGNOSIS — R300 Dysuria: Secondary | ICD-10-CM | POA: Insufficient documentation

## 2017-04-18 DIAGNOSIS — Z818 Family history of other mental and behavioral disorders: Secondary | ICD-10-CM | POA: Diagnosis not present

## 2017-04-18 DIAGNOSIS — Z79899 Other long term (current) drug therapy: Secondary | ICD-10-CM | POA: Insufficient documentation

## 2017-04-18 DIAGNOSIS — Z4801 Encounter for change or removal of surgical wound dressing: Secondary | ICD-10-CM | POA: Insufficient documentation

## 2017-04-18 DIAGNOSIS — Z811 Family history of alcohol abuse and dependence: Secondary | ICD-10-CM | POA: Insufficient documentation

## 2017-04-18 DIAGNOSIS — Z87891 Personal history of nicotine dependence: Secondary | ICD-10-CM | POA: Diagnosis not present

## 2017-04-18 DIAGNOSIS — Z888 Allergy status to other drugs, medicaments and biological substances status: Secondary | ICD-10-CM | POA: Insufficient documentation

## 2017-04-18 DIAGNOSIS — F909 Attention-deficit hyperactivity disorder, unspecified type: Secondary | ICD-10-CM | POA: Diagnosis not present

## 2017-04-18 DIAGNOSIS — O901 Disruption of perineal obstetric wound: Secondary | ICD-10-CM | POA: Diagnosis not present

## 2017-04-18 LAB — WET PREP, GENITAL
Clue Cells Wet Prep HPF POC: NONE SEEN
Sperm: NONE SEEN
Trich, Wet Prep: NONE SEEN
Yeast Wet Prep HPF POC: NONE SEEN

## 2017-04-18 MED ORDER — LIDOCAINE 5 % EX OINT: 1.0000 "application " | TOPICAL_OINTMENT | CUTANEOUS | 0 refills | Status: DC | PRN

## 2017-04-18 MED ORDER — HYDROCORTISONE 2.5 % EX CREA: TOPICAL_CREAM | Freq: Two times a day (BID) | CUTANEOUS | 0 refills | Status: DC

## 2017-04-18 NOTE — MAU Note (Addendum)
Patient had baby on 03/24/17 had a 2nd degree tear with a tear inside as well, every time she sits feels she is tearing apart.  Had episiotomy. Has been using sitz baths.

## 2017-04-18 NOTE — Discharge Instructions (Signed)
Care of a Perineal Tear °A perineal tear is a cut (laceration) in the tissue between the opening of the vagina and the anus (perineum). Some women naturally develop a perineal tear during a vaginal birth. This can happen as the baby emerges from the birth canal and the perineum is stretched. Perineal tears are graded based on how deep and long the laceration is. The grading for perineal tears is as follows: °· First degree. This involves a shallow tear at the edge of the vaginal opening that extends slightly into the perineal skin. °· Second degree. This involves tearing described in a first degree perineal tear and also a deeper tear of the vaginal opening and perineal tissues. It may also include tearing of a muscle just under the perineal skin. °· Third degree. This involves tearing described in a first and second degree perineal tear, with the tear extending into the muscle of the anus (anal sphincter). °· Fourth degree. This involves all levels of tear described for first, second, and third degree perineal tear, with the tear extending into the rectum. ° °First degree perineal tears may or may not be stitched closed, depending on their location and appearance. Second, third, and fourth degree perineal tears are stitched closed immediately after the baby’s birth. °What are the risks? °Depending on the type of perineal tear you have, you may be at risk for the following: °· Bleeding. °· Developing a collection of blood in the perineal tear area (hematoma). °· Pain. This may include pain with urination or bowel movements. °· Infection at the site of the tear. °· Fever. °· Trouble controlling your bowels (fecal incontinence). °· Painful sexual intercourse. ° °How to care for a perineal tear °· The first day, put ice on the area of the tear. °? Put ice in a plastic bag. °? Place a towel between your skin and the bag. °? Leave the ice on for 20 minutes, 2-3 times a day. °· Bathe using a warm sitz bath as directed by  your health care provider. This can speed up healing. Sitz baths can be performed in your bathtub or using a sitz bath kit that fits over your toilet. °? Place 3-4 in. (7.6-10 cm) of warm water in your bathtub or fill the sitz bath over-the-toilet container with warm water. Make sure the water is not too hot by placing a drop on your wrist. °? Sit in the warm water for 20-30 minutes. °? After bathing, pat your perineum dry with a clean towel. Do not scrub the perineum as this could cause pain, irritation, or open any stitches you may have. °? Keep the over-the-toilet sitz bath container clean by rinsing it thoroughly after each use. Ask for help in keeping the bathtub clean with diluted bleach and water (2 Tbsp [30 mL] of bleach to ½ gal [1.9 L] of water). °? Repeat the sitz bath as often as you would like to relieve perineal pain, itching, or discomfort. °· Apply a numbing spray to the perineal tear site as directed by your health care provider. This may help with discomfort. °· Wash your hands before and after applying medicine to the area. °· Put about 3 witch hazel-containing hemorrhoid treatment pads on top of your sanitary pad. The witch hazel in the hemorrhoid pads helps with discomfort and swelling. °· Get a squeeze bottle to squeeze warm water on your perineum when urinating, spraying the area from front to back. Pat the area to dry it. °· Sitting on an inflatable   ring or pillow may provide comfort. °· Take medicines only as directed by your health care provider. °· Do not have sexual intercourse or use tampons until your health care provider says it is okay. Typically, you must wait at least 6 weeks. °· Keep all postpartum appointments as directed by your health care provider. °Contact a health care provider if: °· Your pain is not relieved with medicines. °· You have painful urination. °· You have a fever. °Get help right away if: °· You have redness, swelling, or increasing pain in the area of the  tear. °· You have pus coming from the area of the tear. °· You notice a bad smell coming from the area of the tear. °· Your tear opens. °· You notice swelling in the area of the tear that is larger than when you left the hospital. °· You cannot urinate. °This information is not intended to replace advice given to you by your health care provider. Make sure you discuss any questions you have with your health care provider. °Document Released: 04/02/2014 Document Revised: 04/29/2016 Document Reviewed: 08/22/2013 °Elsevier Interactive Patient Education © 2017 Elsevier Inc. ° °

## 2017-04-18 NOTE — MAU Provider Note (Signed)
Patient Veronica Moore  Is a G1P1 here with complaints of burning and discomfort on her vagina, made worse by urination. She is here because she wants to get checked out; her history is significant for 5 min shoulder dystocia at delivery, as well as 2nd degree tear and ML episiotomy.  History     CSN: [id number]  Arrival date and time: 04/18/17 1236   First Provider Initiated Contact with Patient 04/18/17 1321      Chief Complaint  Patient presents with  . Wound Check   HPI She feels burning with urination when urine touches her skin, but no dysuria. For the past few days she has felt like something was "open" down there. She was recently in a swimming pool and is worried about an infection.  She denies bleeding and fever. She endorses pain with intercourse.  OB History    Gravida Para Term Preterm AB Living   2 1 1  0 1 1   SAB TAB Ectopic Multiple Live Births   1     0 1      Past Medical History:  Diagnosis Date  . ADHD (attention deficit hyperactivity disorder)   . Alcohol abuse, in remission   . Allergy   . Benzodiazepine abuse in remission   . Bipolar depression (HCC)   . Bipolar disorder (HCC)   . PTSD (post-traumatic stress disorder)   . PTSD (post-traumatic stress disorder)   . Suicide threat or attempt    history of SI and history of attempt 2015    Past Surgical History:  Procedure Laterality Date  . NO PAST SURGERIES      Family History  Problem Relation Age of Onset  . ADD / ADHD Mother   . Alcohol abuse Mother   . Anxiety disorder Mother   . Bipolar disorder Mother   . Drug abuse Mother   . Seizures Mother   . Sexual abuse Mother   . ADD / ADHD Father   . Alcohol abuse Father   . Anxiety disorder Father   . Bipolar disorder Father   . Depression Father   . Drug abuse Father   . ADD / ADHD Sister   . ADD / ADHD Brother   . Dementia Maternal Grandmother   . Seizures Cousin   . Schizophrenia Cousin   . ADD / ADHD Sister     Social History   Substance Use Topics  . Smoking status: Former Smoker    Packs/day: 1.00    Years: 8.00    Types: Cigarettes    Quit date: 06/13/2016  . Smokeless tobacco: Never Used  . Alcohol use No    Allergies:  Allergies  Allergen Reactions  . Red Dye Anaphylaxis and Other (See Comments)    Pt is allergic to red dye 40.   Marland Kitchen. Pineapple Swelling and Other (See Comments)    Reaction:  Mouth/lip swelling     Prescriptions Prior to Admission  Medication Sig Dispense Refill Last Dose  . acetaminophen (TYLENOL) 325 MG tablet Take 2 tablets (650 mg total) by mouth every 4 (four) hours as needed for moderate pain. (Patient not taking: Reported on 03/31/2017) 30 tablet 0 Not Taking  . acyclovir (ZOVIRAX) 400 MG tablet Take 1 tablet (400 mg total) by mouth 3 (three) times daily. (Patient not taking: Reported on 03/31/2017) 60 tablet 1 Not Taking  . ARIPiprazole (ABILIFY) 5 MG tablet Take 1 tablet (5 mg total) by mouth daily. Take 1/2 tablet for about a week,  then increase to 1 tablet daily (Patient taking differently: Take 5 mg by mouth daily. ) 90 tablet 0 Taking  . calcium carbonate (TUMS - DOSED IN MG ELEMENTAL CALCIUM) 500 MG chewable tablet Chew 1-2 tablets by mouth every 2 (two) hours as needed for indigestion or heartburn.   Not Taking  . ferrous sulfate 325 (65 FE) MG tablet Take 325 mg by mouth daily with breakfast.    Not Taking  . Prenatal Vit-Fe Fumarate-FA (PRENATAL MULTIVITAMIN) TABS tablet Take 1 tablet by mouth daily at 12 noon. (Patient taking differently: Take 1 tablet by mouth daily. ) 90 tablet 1 Taking    Review of Systems  Respiratory: Negative.   Cardiovascular: Negative.   Gastrointestinal: Negative.   Genitourinary: Positive for dyspareunia and vaginal pain. Negative for difficulty urinating, dysuria, flank pain and frequency.  Musculoskeletal: Negative.   Neurological: Negative.    Physical Exam   Blood pressure 112/62, pulse 87, temperature 98.2 F (36.8 C), resp. rate 18,  unknown if currently breastfeeding.  Physical Exam  Constitutional: She is oriented to person, place, and time. She appears well-developed and well-nourished.  HENT:  Head: Normocephalic.  Neck: Normal range of motion.  GI: Soft.  Genitourinary:  Genitourinary Comments: NEFG; vaginal walls are pink and intact; cervix is pink with no CMT. One stitch still present on the perineum with slight separation of edges of episiotomy. No bleeding, no pus, no usual discharge or oder. Vagino-recto exam normal; no evidence of fistula.  Musculoskeletal: Normal range of motion.  Neurological: She is alert and oriented to person, place, and time.  Skin: Skin is warm and dry.  Psychiatric: She has a normal mood and affect.    MAU Course  Procedures  MDM -No evidence of wound infection (no redness or pus or warmth) or vaginal-rectal fistula; pain mostly likely due to slight separation of the 2nd degree laceration   Assessment and Plan   1. Disruption of perineal laceration repair in the puerperium     2. Patient advise pelvic rest for at least 2 months until wound is completely healed.  3. Recommended stool softeners to avoid straining, continue to use peri bottle. Avoid baths, douching, water immersion until healed.  4. RX for Lidocaine jelly and steroid cream sent to pharmacy 5. Patient to keep her pp appointment on the 05-05-2017  Charlesetta Garibaldi Kooistra CNM 04/18/2017, 1:25 PM

## 2017-04-19 ENCOUNTER — Ambulatory Visit (HOSPITAL_COMMUNITY): Payer: BC Managed Care – PPO | Admitting: Psychiatry

## 2017-04-19 ENCOUNTER — Ambulatory Visit (INDEPENDENT_AMBULATORY_CARE_PROVIDER_SITE_OTHER): Payer: BC Managed Care – PPO | Admitting: Psychiatry

## 2017-04-19 ENCOUNTER — Encounter (HOSPITAL_COMMUNITY): Payer: Self-pay | Admitting: Psychiatry

## 2017-04-19 VITALS — BP 104/64 | HR 76 | Ht 60.0 in | Wt 133.0 lb

## 2017-04-19 DIAGNOSIS — Z810 Family history of intellectual disabilities: Secondary | ICD-10-CM

## 2017-04-19 DIAGNOSIS — Z813 Family history of other psychoactive substance abuse and dependence: Secondary | ICD-10-CM | POA: Diagnosis not present

## 2017-04-19 DIAGNOSIS — Z811 Family history of alcohol abuse and dependence: Secondary | ICD-10-CM | POA: Diagnosis not present

## 2017-04-19 DIAGNOSIS — Z818 Family history of other mental and behavioral disorders: Secondary | ICD-10-CM

## 2017-04-19 DIAGNOSIS — F17210 Nicotine dependence, cigarettes, uncomplicated: Secondary | ICD-10-CM

## 2017-04-19 DIAGNOSIS — F3175 Bipolar disorder, in partial remission, most recent episode depressed: Secondary | ICD-10-CM | POA: Diagnosis not present

## 2017-04-19 DIAGNOSIS — F4310 Post-traumatic stress disorder, unspecified: Secondary | ICD-10-CM

## 2017-04-19 MED ORDER — ARIPIPRAZOLE 5 MG PO TABS: 5.0000 mg | ORAL_TABLET | Freq: Every day | ORAL | 1 refills | Status: DC

## 2017-04-19 MED ORDER — SERTRALINE HCL 50 MG PO TABS: 50.0000 mg | ORAL_TABLET | Freq: Every day | ORAL | 1 refills | Status: DC

## 2017-04-19 NOTE — Progress Notes (Signed)
BH MD/PA/NP OP Progress Note  04/19/2017 9:23 AM Veronica Moore  MRN:  [id number]  Chief Complaint:  Chief Complaint    Follow-up    med follow-up  Subjective:  Veronica Moore presents today for follow-up of depression, PTSD, bipolar. Spent time reviewing the birth of her child recently. She shared some of the health concerns that baby Lollie Sails has been having, and I spent time empathizing and validating with her difficulties. She has noted some increase in her anxiety and worry, which I normalized. We also spent time discussing the use of Zoloft for anxiety given her history of trauma and depression. She was agreeable to start Zoloft at 25 mg, increase to 50 mg in one week. She is noted substantial improvements in her mood with Abilify, and denies any suicidal thoughts. She denies any baby harm thoughts. Her fianc has given her very positive feedback on her mood improvements with Abilify. She agrees to follow-up in 3 months.  Visit Diagnosis:    ICD-9-CM ICD-10-CM   1. PTSD (post-traumatic stress disorder) 309.81 F43.10 sertraline (ZOLOFT) 50 MG tablet  2. Bipolar disorder, in partial remission, most recent episode depressed (HCC) 296.55 F31.75 ARIPiprazole (ABILIFY) 5 MG tablet    Past Psychiatric History: See intake H&P for full details. Reviewed, with no updates at this time.   Past Medical History:  Past Medical History:  Diagnosis Date  . ADHD (attention deficit hyperactivity disorder)   . Alcohol abuse, in remission   . Allergy   . Benzodiazepine abuse in remission   . Bipolar depression (HCC)   . Bipolar disorder (HCC)   . PTSD (post-traumatic stress disorder)   . PTSD (post-traumatic stress disorder)   . Suicide threat or attempt    history of SI and history of attempt 2015    Past Surgical History:  Procedure Laterality Date  . NO PAST SURGERIES      Family Psychiatric History: See intake H&P for full details. Reviewed, with no updates at this time.   Family History:   Family History  Problem Relation Age of Onset  . ADD / ADHD Mother   . Alcohol abuse Mother   . Anxiety disorder Mother   . Bipolar disorder Mother   . Drug abuse Mother   . Seizures Mother   . Sexual abuse Mother   . ADD / ADHD Father   . Alcohol abuse Father   . Anxiety disorder Father   . Bipolar disorder Father   . Depression Father   . Drug abuse Father   . ADD / ADHD Sister   . ADD / ADHD Brother   . Dementia Maternal Grandmother   . Seizures Cousin   . Schizophrenia Cousin   . ADD / ADHD Sister     Social History:  Social History   Social History  . Marital status: Single    Spouse name: N/A  . Number of children: N/A  . Years of education: N/A   Social History Main Topics  . Smoking status: Current Some Day Smoker    Packs/day: 1.00    Years: 8.00    Types: Cigarettes    Last attempt to quit: 06/13/2016  . Smokeless tobacco: Never Used     Comment: Now smoking 1-2 a day.  . Alcohol use No  . Drug use: No     Comment: none with pregnancy  . Sexual activity: Not Currently    Partners: Male    Birth control/ protection: None   Other Topics Concern  .  None   Social History Narrative   Patient currently lives with her fianc. She works at [employer]. Has a close relationship with her twin sister and her grandmother.  She has a prior history of significant opiate, cocaine, and alcohol addiction.  Has been abstinent since May 2017    Allergies:  Allergies  Allergen Reactions  . Red Dye Anaphylaxis and Other (See Comments)    Pt is allergic to red dye 40.   Marland Kitchen. Pineapple Swelling and Other (See Comments)    Reaction:  Mouth/lip swelling     Metabolic Disorder Labs: No results found for: HGBA1C, MPG No results found for: PROLACTIN No results found for: CHOL, TRIG, HDL, CHOLHDL, VLDL, LDLCALC   Current Medications: Current Outpatient Prescriptions  Medication Sig Dispense Refill  . acetaminophen (TYLENOL) 325 MG tablet Take 2 tablets (650 mg  total) by mouth every 4 (four) hours as needed for moderate pain. 30 tablet 0  . ARIPiprazole (ABILIFY) 5 MG tablet Take 1 tablet (5 mg total) by mouth daily. 90 tablet 1  . hydrocortisone 2.5 % cream Apply topically 2 (two) times daily. 30 g 0  . lidocaine (XYLOCAINE) 5 % ointment Apply 1 application topically as needed. 35.44 g 0  . Prenatal Vit-Fe Fumarate-FA (PRENATAL MULTIVITAMIN) TABS tablet Take 1 tablet by mouth daily at 12 noon. (Patient taking differently: Take 1 tablet by mouth daily. ) 90 tablet 1  . acyclovir (ZOVIRAX) 400 MG tablet Take 1 tablet (400 mg total) by mouth 3 (three) times daily. (Patient not taking: Reported on 03/31/2017) 60 tablet 1  . calcium carbonate (TUMS - DOSED IN MG ELEMENTAL CALCIUM) 500 MG chewable tablet Chew 1-2 tablets by mouth every 2 (two) hours as needed for indigestion or heartburn.    . sertraline (ZOLOFT) 50 MG tablet Take 1 tablet (50 mg total) by mouth daily. 90 tablet 1   No current facility-administered medications for this visit.     Neurologic: Headache: Negative Seizure: Negative Paresthesias: Negative  Musculoskeletal: Strength & Muscle Tone: within normal limits Gait & Station: normal Patient leans: N/A  Psychiatric Specialty Exam: ROS  Blood pressure 104/64, pulse 76, height 5' (1.524 m), weight 60.3 kg (133 lb), unknown if currently breastfeeding.Body mass index is 25.97 kg/m.  General Appearance: Casual and Well Groomed  Eye Contact:  Good  Speech:  Clear and Coherent  Volume:  Normal  Mood:  Anxious  Affect:  Congruent  Thought Process:  Goal Directed  Orientation:  Full (Time, Place, and Person)  Thought Content: Logical   Suicidal Thoughts:  No  Homicidal Thoughts:  No  Memory:  Immediate;   Good  Judgement:  Good  Insight:  Fair  Psychomotor Activity:  Normal  Concentration:  Concentration: Fair  Recall:  Good  Fund of Knowledge: Good  Language: Good  Akathisia:  Negative  Handed:  Right  AIMS (if indicated):   0  Assets:  Communication Skills Desire for Improvement Financial Resources/Insurance Housing Intimacy Transportation  ADL's:  Intact  Cognition: WNL  Sleep:  5-6 hours, new baby     Treatment Plan Summary: Veronica Moore is a 21 year old female with chronic PTSD, and a history of bipolar disorder.   She is approximately one month postpartum. 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 She has had some improvement of her mood lability with Abilify, but continues to struggle with anxiety. We will titrate Zoloft as below and follow up in 3 months.  No acute safety issues at this time.  1. PTSD (post-traumatic stress disorder)   2. Bipolar disorder, in partial remission, most recent episode depressed (HCC)    Continue Abilify 5 mg daily Zoloft 50 mg daily, plan to titrate to max dose, and taper Abilify if tolerated Follow-up in 3 months Spent time continuing to encourage healthy sleep cycle, and utilizing fianc support so that she can get longer periods of uninterrupted sleep  Burnard Leigh, MD 04/19/2017, 9:23 AM

## 2017-04-30 ENCOUNTER — Encounter (HOSPITAL_COMMUNITY): Payer: Self-pay

## 2017-04-30 ENCOUNTER — Inpatient Hospital Stay (HOSPITAL_COMMUNITY)
Admission: AD | Admit: 2017-04-30 | Discharge: 2017-05-01 | Disposition: A | Discharge status: Home / Self Care | Payer: BC Managed Care – PPO | Source: Ambulatory Visit | Attending: Obstetrics & Gynecology | Admitting: Obstetrics & Gynecology

## 2017-04-30 DIAGNOSIS — Z813 Family history of other psychoactive substance abuse and dependence: Secondary | ICD-10-CM | POA: Insufficient documentation

## 2017-04-30 DIAGNOSIS — N751 Abscess of Bartholin's gland: Secondary | ICD-10-CM | POA: Insufficient documentation

## 2017-04-30 DIAGNOSIS — Z811 Family history of alcohol abuse and dependence: Secondary | ICD-10-CM | POA: Insufficient documentation

## 2017-04-30 DIAGNOSIS — F909 Attention-deficit hyperactivity disorder, unspecified type: Secondary | ICD-10-CM | POA: Insufficient documentation

## 2017-04-30 DIAGNOSIS — Z79899 Other long term (current) drug therapy: Secondary | ICD-10-CM | POA: Insufficient documentation

## 2017-04-30 DIAGNOSIS — F4310 Post-traumatic stress disorder, unspecified: Secondary | ICD-10-CM | POA: Insufficient documentation

## 2017-04-30 DIAGNOSIS — F319 Bipolar disorder, unspecified: Secondary | ICD-10-CM | POA: Insufficient documentation

## 2017-04-30 DIAGNOSIS — F17210 Nicotine dependence, cigarettes, uncomplicated: Secondary | ICD-10-CM | POA: Insufficient documentation

## 2017-04-30 DIAGNOSIS — Z818 Family history of other mental and behavioral disorders: Secondary | ICD-10-CM | POA: Insufficient documentation

## 2017-04-30 NOTE — MAU Note (Addendum)
I have a cyst that was size of small marble this am. Now is 3 times that long on outer labia. Area is swollen and long. Had baby 5.5wks ago. Vaginal delivery with shoulder dystocia. Baby doing well.

## 2017-05-01 ENCOUNTER — Encounter (HOSPITAL_COMMUNITY): Payer: Self-pay | Admitting: Advanced Practice Midwife

## 2017-05-01 DIAGNOSIS — N751 Abscess of Bartholin's gland: Secondary | ICD-10-CM | POA: Diagnosis not present

## 2017-05-01 DIAGNOSIS — F909 Attention-deficit hyperactivity disorder, unspecified type: Secondary | ICD-10-CM | POA: Diagnosis not present

## 2017-05-01 DIAGNOSIS — Z79899 Other long term (current) drug therapy: Secondary | ICD-10-CM | POA: Diagnosis not present

## 2017-05-01 DIAGNOSIS — Z813 Family history of other psychoactive substance abuse and dependence: Secondary | ICD-10-CM | POA: Diagnosis not present

## 2017-05-01 DIAGNOSIS — F319 Bipolar disorder, unspecified: Secondary | ICD-10-CM | POA: Diagnosis not present

## 2017-05-01 DIAGNOSIS — F4310 Post-traumatic stress disorder, unspecified: Secondary | ICD-10-CM | POA: Diagnosis not present

## 2017-05-01 DIAGNOSIS — F17210 Nicotine dependence, cigarettes, uncomplicated: Secondary | ICD-10-CM | POA: Diagnosis not present

## 2017-05-01 DIAGNOSIS — Z811 Family history of alcohol abuse and dependence: Secondary | ICD-10-CM | POA: Diagnosis not present

## 2017-05-01 DIAGNOSIS — Z818 Family history of other mental and behavioral disorders: Secondary | ICD-10-CM | POA: Diagnosis not present

## 2017-05-01 MED ORDER — OXYCODONE-ACETAMINOPHEN 5-325 MG PO TABS
2.0000 | ORAL_TABLET | Freq: Once | ORAL | Status: AC
  Administered 2017-05-01: 2 via ORAL
  Filled 2017-05-01: qty 2

## 2017-05-01 MED ORDER — IBUPROFEN 600 MG PO TABS: 600.0000 mg | ORAL_TABLET | Freq: Four times a day (QID) | ORAL | 1 refills | Status: DC | PRN

## 2017-05-01 NOTE — Discharge Instructions (Signed)
Bartholin Cyst or Abscess A Bartholin cyst is a fluid-filled sac that forms on a Bartholin gland. Bartholin glands are small glands that are located within the folds of skin (labia) along the sides of the lower opening of the vagina. These glands produce a fluid to moisten the outside of the vagina during sexual intercourse. A Bartholin cyst causes a bulge on the side of the vagina. A cyst that is not large or infected may not cause symptoms or problems. However, if the fluid within the cyst becomes infected, the cyst can turn into an abscess. An abscess may cause discomfort or pain. What are the causes? A Bartholin cyst may develop when the duct of the gland becomes blocked. In many cases, the cause of this is not known. Various kinds of bacteria can cause the cyst to become infected and develop into an abscess. What increases the risk? You may be at an increased risk of developing a Bartholin cyst or abscess if:  You are a woman of reproductive age.  You have a history of previous Bartholin cysts or abscesses.  You have diabetes.  You have a sexually transmitted disease (STD).  What are the signs or symptoms? The severity of symptoms varies depending on the size of the cyst and whether it is infected. Symptoms may include:  A bulge or swelling near the lower opening of your vagina.  Discomfort or pain.  Redness.  Pain during sexual intercourse.  Pain when walking.  Fluid draining from the area.  How is this diagnosed? Your health care provider may make a diagnosis based on your symptoms and a physical exam. He or she will look for swelling in your vaginal area. Blood tests may be done to check for infections. A sample of fluid from the cyst or abscess may also be taken to be tested in a lab. How is this treated? Small cysts that are not infected may not require any treatment. These often go away on their own. Yourhealth care provider will recommend hot baths and the use of warm  compresses. These may also be part of the treatment for an abscess. Treatment options for a large cyst or abscess may include:  Antibiotic medicine.  A surgical procedure to drain the abscess. One of the following procedures may be done: ? Incision and drainage. An incision is made in the cyst or abscess so that the fluid drains out. A catheter may be placed inside the cyst so that it does not close and fill up with fluid again. The catheter will be removed after you have a follow-up visit with a specialist (gynecologist). ? Marsupialization. The cyst or abscess is opened and kept open by stitching the edges of the skin to the walls of the cyst or abscess. This allows it to continue to drain and not fill up with fluid again.  If you have cysts or abscesses that keep returning and have required incision and drainage multiple times, your health care provider may talk to you about surgery to remove the Bartholin gland. Follow these instructions at home:  Take medicines only as directed by your health care provider.  If you were prescribed an antibiotic medicine, finish it all even if you start to feel better.  Apply warm, wet compresses to the area or take warm, shallow baths that cover your pelvic region (sitz baths) several times a day or as directed by your health care provider.  Do not squeeze the cyst or apply heavy pressure to it.    Do not have sexual intercourse until the cyst has gone away.  If your cyst or abscess was opened, a small piece of gauze or a drain may have been placed in the area to allow drainage. Do not remove the gauze or the drain until directed by your health care provider.  Wear feminine pads-not tampons-as needed for any drainage or bleeding.  Keep all follow-up visits as directed by your health care provider. This is important. How is this prevented? Take these steps to help prevent a Bartholin cyst from returning:  Practice good hygiene.  Clean your vaginal  area with mild soap and a soft cloth when you bathe.  Practice safe sex to prevent STDs.  Contact a health care provider if:  You have increased pain, swelling, or redness in the area of the cyst.  Puslike drainage is coming from the cyst.  You have a fever. This information is not intended to replace advice given to you by your health care provider. Make sure you discuss any questions you have with your health care provider. Document Released: 11/16/2005 Document Revised: 04/23/2016 Document Reviewed: 07/02/2014 [employer] Interactive Patient Education  2018 [employer]  
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
## 2017-05-05 ENCOUNTER — Encounter: Payer: Self-pay | Admitting: General Practice

## 2017-05-05 ENCOUNTER — Encounter: Payer: Self-pay | Admitting: Family Medicine

## 2017-05-05 ENCOUNTER — Ambulatory Visit (INDEPENDENT_AMBULATORY_CARE_PROVIDER_SITE_OTHER): Payer: BC Managed Care – PPO | Admitting: Family Medicine

## 2017-05-05 DIAGNOSIS — Z3202 Encounter for pregnancy test, result negative: Secondary | ICD-10-CM

## 2017-05-05 DIAGNOSIS — Z30430 Encounter for insertion of intrauterine contraceptive device: Secondary | ICD-10-CM

## 2017-05-05 LAB — POCT PREGNANCY, URINE: Preg Test, Ur: NEGATIVE

## 2017-05-05 MED ORDER — PARAGARD INTRAUTERINE COPPER IU IUD
1.0000 [IU] | INTRAUTERINE_SYSTEM | Freq: Once | INTRAUTERINE | Status: AC
  Administered 2017-05-05: 1 [IU] via INTRAUTERINE

## 2017-05-05 NOTE — Progress Notes (Signed)
Subjective:     Veronica RasSydney Moore is a 21 y.o. female who presents for a postpartum visit. She is 5 weeks postpartum following a spontaneous vaginal delivery. I have fully reviewed the prenatal and intrapartum course. The delivery was at 41.1 gestational weeks. Outcome: spontaneous vaginal delivery. Anesthesia: epidural. Postpartum course has been complicated by some PTSD from delivery, but she says she is doing much better now that the baby is fine. 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 Baby is feeding by bottle - Similac Advance. Bleeding no bleeding. Bowel function is normal. Bladder function is normal. Patient is sexually active, over 2 weeks ago without condom, negative pregnancy test today. Contraception method is none. Patient is interested in the Paragard. Depression/anxiety screening: negative.  The following portions of the patient's history were reviewed and updated as appropriate: allergies, current medications, past family history, past medical history, past social history, past surgical history and problem list.  Review of Systems Pertinent items noted in HPI and remainder of comprehensive ROS otherwise negative.   Objective:    There were no vitals taken for this visit.  General:  alert, cooperative, appears stated age and no distress  Lungs: clear to auscultation bilaterally  Heart:  regular rate and rhythm, S1, S2 normal, no murmur, click, rub or gallop  Abdomen: soft, non-tender; bowel sounds normal; no masses,  no organomegaly   Vulva:  Normal, small open area from episiotomy (about 0.5cm) with granulation tissue just left of midline where episiotomy was healing, not draining and no erythema, no dehiscence of repair. No fluctuance where bartholin cyst on right labia was, completely resolved and healed.  Vagina:  normal vagina, no discharge, exudate, lesion, or erythema  Cervix:  multiparous appearance and no lesions . IUD placed  Corpus: normal size, contour, position, consistency, mobility, non-tender  Adnexa:  normal adnexa  Rectal Exam: Not performed.        Assessment:     6 week postpartum exam. Pap smear  Performed 08/20/2016, negative, repeat in 3 years.  Plan:    1. Contraception: IUD Paraguard, inserted today. 2. Pap next to be done in 2020. 3. Follow up in: 4 weeks for IUD string check or as needed.

## 2017-05-05 NOTE — Progress Notes (Signed)
    GYNECOLOGY OFFICE PROCEDURE NOTE  Veronica RasSydney Moore is a 21 y.o. G2P1011 here for Paraguard IUD insertion. No GYN concerns.  Last pap smear was on 08/20/16 and was normal.  IUD Insertion  Patient identified, informed consent performed, consent signed.   Discussed risks of irregular bleeding, cramping, infection, malpositioning or misplacement of the IUD outside the uterus which may require further procedures. Also advised to use backup contraception for one week as the risk of pregnancy is higher during the transition period after inserting IUD. Time out was performed. Speculum placed in the vagina. The cervix was cleaned with Betadine x 2 and grasped anteriorly with a single tooth tenaculum.  The uterus was sounded to 7cm. The Paraguard IUD was then placed per manufacturer's recommendations. Strings trimmed to 3 cm. Tenaculum was removed, good hemostasis noted. Patient tolerated procedure well.   Patient was given post-procedure instructions.  She was reminded to have backup contraception for one week.  Patient was also asked to check IUD strings periodically and follow up in 4 weeks for IUD check.  Insertion was performed by Venia CarbonJennifer Rasch, NP, under my supervision.    Cleda ClarksElizabeth W. Mumaw, DO  OB Fellow [facility], [facility]

## 2017-05-05 NOTE — Addendum Note (Signed)
Addended by: Raynald BlendZEYFANG, LINDA L on: 05/05/2017 11:04 AM   Modules accepted: Orders

## 2017-05-05 NOTE — Patient Instructions (Signed)

## 2017-05-31 ENCOUNTER — Ambulatory Visit: Payer: Self-pay | Admitting: Family Medicine

## 2017-05-31 ENCOUNTER — Encounter: Payer: Self-pay | Admitting: *Deleted

## 2017-05-31 NOTE — Progress Notes (Signed)
Veronica Moore did not keep a scheduled appt for IUD string check. Per Dr. Omer JackMumaw does not need to be call- may reschedule if she calls.

## 2017-06-07 ENCOUNTER — Ambulatory Visit (HOSPITAL_COMMUNITY): Payer: Self-pay | Admitting: Psychiatry

## 2017-06-11 ENCOUNTER — Other Ambulatory Visit: Payer: Self-pay | Admitting: Family Medicine

## 2017-07-01 ENCOUNTER — Encounter: Payer: Self-pay | Admitting: Advanced Practice Midwife

## 2017-07-01 ENCOUNTER — Ambulatory Visit (INDEPENDENT_AMBULATORY_CARE_PROVIDER_SITE_OTHER): Payer: BC Managed Care – PPO | Admitting: Advanced Practice Midwife

## 2017-07-01 VITALS — BP 107/59 | HR 58 | Ht 60.0 in | Wt 129.9 lb

## 2017-07-01 DIAGNOSIS — T839XXA Unspecified complication of genitourinary prosthetic device, implant and graft, initial encounter: Secondary | ICD-10-CM

## 2017-07-01 DIAGNOSIS — Z975 Presence of (intrauterine) contraceptive device: Secondary | ICD-10-CM | POA: Diagnosis not present

## 2017-07-01 DIAGNOSIS — Z30431 Encounter for routine checking of intrauterine contraceptive device: Secondary | ICD-10-CM | POA: Diagnosis not present

## 2017-07-01 NOTE — Progress Notes (Signed)
  GYNECOLOGY CLINIC PROGRESS NOTE  History:  21 y.o. G2P1011 here today for today for problem visit related to Paragard IUD.  Pt reports string is too long and she can feel it close to the introitus when putting in a tampon and her partner can feel it during intercourse.  She reports regular light periods with the device and normal menstrual cramping.  She denies other concerns or associated symptoms.   The following portions of the patient's history were reviewed and updated as appropriate: allergies, current medications, past family history, past medical history, past social history, past surgical history and problem list. Last pap smear on 08/20/16 was normal.  Review of Systems:  Pertinent items are noted in HPI.   Objective:  Physical Exam Blood pressure (!) 107/59, pulse (!) 58, height 5' (1.524 m), weight 129 lb 14.4 oz (58.9 kg), not currently breastfeeding. Gen: NAD Abd: Soft, nontender and nondistended Pelvic: Normal appearing external genitalia; normal appearing vaginal mucosa and cervix.  IUD strings visualized, about 4 cm in length outside cervix. Discussed normal findings with pt, option to cut strings shorter today or see if strings soften over time and symptoms improve. Cutting strings shorter may be more uncomfortable for partner unless strings are cut to be just inside of cervix. Pt does not want this because she cannot check the device herself.  Pt decided to wait today and follow up in 2 months.    Assessment & Plan:  Normal IUD check. F/U in 2 months if symptoms persist  Sharen CounterLisa Leftwich-Kirby, CNM 2:54 PM

## 2017-07-01 NOTE — Patient Instructions (Signed)

## 2017-11-05 ENCOUNTER — Ambulatory Visit: Payer: Self-pay | Admitting: Advanced Practice Midwife

## 2017-12-15 ENCOUNTER — Telehealth (HOSPITAL_COMMUNITY): Payer: Self-pay

## 2017-12-15 DIAGNOSIS — F4310 Post-traumatic stress disorder, unspecified: Secondary | ICD-10-CM

## 2017-12-15 NOTE — Telephone Encounter (Signed)
Medication refill request - Fax received from pt's [facility] for a refill of her past prescribed Zoloft, last ordered 04/19/17  for 90 days + 1 refill. Patient cancelled 06/07/17 and has never rescheduled an appt.

## 2017-12-15 NOTE — Telephone Encounter (Signed)
We can send 30 days and she should get further refills from pcp as it seems she has dropped out of treatment with us

## 2017-12-21 MED ORDER — SERTRALINE HCL 50 MG PO TABS: 50.0000 mg | ORAL_TABLET | Freq: Every day | ORAL | 0 refills | Status: DC

## 2017-12-21 NOTE — Telephone Encounter (Signed)
A new 30 day order of patient's past prescribed Zoloft 50 mg, one a day with no refills e-scribed to patient's [facility] with notation, no further refills until patient evaluated.  PCP may take over orders per Dr. Rene KocherEksir approval and instruction.

## 2018-02-09 ENCOUNTER — Other Ambulatory Visit: Payer: Self-pay

## 2018-02-09 ENCOUNTER — Encounter (HOSPITAL_COMMUNITY): Payer: Self-pay

## 2018-02-09 ENCOUNTER — Emergency Department (HOSPITAL_COMMUNITY)
Admission: EM | Admit: 2018-02-09 | Discharge: 2018-02-09 | Disposition: A | Discharge status: Home / Self Care | Payer: BC Managed Care – PPO | Attending: Emergency Medicine | Admitting: Emergency Medicine

## 2018-02-09 DIAGNOSIS — Z5321 Procedure and treatment not carried out due to patient leaving prior to being seen by health care provider: Secondary | ICD-10-CM | POA: Insufficient documentation

## 2018-02-09 DIAGNOSIS — R51 Headache: Secondary | ICD-10-CM | POA: Diagnosis not present

## 2018-02-09 NOTE — ED Notes (Signed)
Pt reports that now that her BP is normal, she does not want to wait to be seen.

## 2018-02-09 NOTE — ED Triage Notes (Addendum)
Pt reports a headache x3 days. She states that it comes and goes. She is reporting photophobia and some nausea with the headache. She is also reporting low BP at home. Denies hx of migraine. A&Ox4. Ambulatory.

## 2018-02-10 DIAGNOSIS — Z79899 Other long term (current) drug therapy: Secondary | ICD-10-CM | POA: Insufficient documentation

## 2018-02-10 DIAGNOSIS — F4310 Post-traumatic stress disorder, unspecified: Secondary | ICD-10-CM | POA: Insufficient documentation

## 2018-02-10 DIAGNOSIS — H538 Other visual disturbances: Secondary | ICD-10-CM | POA: Diagnosis not present

## 2018-02-10 DIAGNOSIS — R51 Headache: Secondary | ICD-10-CM | POA: Insufficient documentation

## 2018-02-10 DIAGNOSIS — F17210 Nicotine dependence, cigarettes, uncomplicated: Secondary | ICD-10-CM | POA: Insufficient documentation

## 2018-02-10 NOTE — ED Triage Notes (Signed)
Patient with a history of migraines comes in today with blurry vision and occasional complete loss of vision in her right eye. Last night patient vomited x 1 but no vomiting today.  Patient does not have a headache at this time.  Patient was examined by her eye doctor three days ago and said her eyes were fine and suggested she see a neurologist.  Patient tried to see one but soonest appointment was for a month and a half from now.

## 2018-02-11 ENCOUNTER — Emergency Department (HOSPITAL_COMMUNITY)
Admission: EM | Admit: 2018-02-11 | Discharge: 2018-02-11 | Disposition: A | Discharge status: Home / Self Care | Payer: BC Managed Care – PPO | Attending: Emergency Medicine | Admitting: Emergency Medicine

## 2018-02-11 DIAGNOSIS — R519 Headache, unspecified: Secondary | ICD-10-CM

## 2018-02-11 DIAGNOSIS — H539 Unspecified visual disturbance: Secondary | ICD-10-CM

## 2018-02-11 DIAGNOSIS — R51 Headache: Secondary | ICD-10-CM

## 2018-02-11 MED ORDER — BUTALBITAL-APAP-CAFFEINE 50-325-40 MG PO TABS: 1.0000 | ORAL_TABLET | Freq: Four times a day (QID) | ORAL | 0 refills | Status: AC | PRN
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
## 2018-02-11 NOTE — Discharge Instructions (Signed)
Please take fioricet as needed for headache.  Follow up with neurologist as previously scheduled for further evaluation of your condition.

## 2018-03-18 ENCOUNTER — Ambulatory Visit: Payer: Self-pay | Admitting: Advanced Practice Midwife

## 2018-03-24 ENCOUNTER — Ambulatory Visit: Payer: Self-pay | Admitting: Advanced Practice Midwife

## 2019-02-22 DIAGNOSIS — N941 Unspecified dyspareunia: Secondary | ICD-10-CM | POA: Insufficient documentation

## 2019-02-22 DIAGNOSIS — N939 Abnormal uterine and vaginal bleeding, unspecified: Secondary | ICD-10-CM | POA: Insufficient documentation

## 2019-03-30 ENCOUNTER — Encounter: Payer: Self-pay | Admitting: *Deleted

## 2020-04-23 ENCOUNTER — Inpatient Hospital Stay (HOSPITAL_COMMUNITY)
Admission: AD | Admit: 2020-04-23 | Discharge: 2020-04-23 | Disposition: A | Discharge status: Home / Self Care | Payer: BC Managed Care – PPO | Attending: Obstetrics and Gynecology | Admitting: Obstetrics and Gynecology

## 2020-04-23 ENCOUNTER — Other Ambulatory Visit: Payer: Self-pay

## 2020-04-23 ENCOUNTER — Telehealth: Payer: Self-pay | Admitting: General Practice

## 2020-04-23 ENCOUNTER — Encounter (HOSPITAL_COMMUNITY): Payer: Self-pay | Admitting: Obstetrics and Gynecology

## 2020-04-23 ENCOUNTER — Inpatient Hospital Stay (HOSPITAL_COMMUNITY): Payer: BC Managed Care – PPO

## 2020-04-23 DIAGNOSIS — R109 Unspecified abdominal pain: Secondary | ICD-10-CM | POA: Diagnosis present

## 2020-04-23 DIAGNOSIS — F4310 Post-traumatic stress disorder, unspecified: Secondary | ICD-10-CM | POA: Insufficient documentation

## 2020-04-23 DIAGNOSIS — B9689 Other specified bacterial agents as the cause of diseases classified elsewhere: Secondary | ICD-10-CM | POA: Diagnosis not present

## 2020-04-23 DIAGNOSIS — O3680X0 Pregnancy with inconclusive fetal viability, not applicable or unspecified: Secondary | ICD-10-CM | POA: Diagnosis not present

## 2020-04-23 DIAGNOSIS — F319 Bipolar disorder, unspecified: Secondary | ICD-10-CM | POA: Insufficient documentation

## 2020-04-23 DIAGNOSIS — O23599 Infection of other part of genital tract in pregnancy, unspecified trimester: Secondary | ICD-10-CM | POA: Insufficient documentation

## 2020-04-23 DIAGNOSIS — O99340 Other mental disorders complicating pregnancy, unspecified trimester: Secondary | ICD-10-CM | POA: Diagnosis not present

## 2020-04-23 DIAGNOSIS — Z87891 Personal history of nicotine dependence: Secondary | ICD-10-CM | POA: Diagnosis not present

## 2020-04-23 DIAGNOSIS — N760 Acute vaginitis: Secondary | ICD-10-CM

## 2020-04-23 DIAGNOSIS — F909 Attention-deficit hyperactivity disorder, unspecified type: Secondary | ICD-10-CM | POA: Insufficient documentation

## 2020-04-23 HISTORY — DX: Pregnancy with inconclusive fetal viability, not applicable or unspecified: O36.80X0

## 2020-04-23 HISTORY — DX: Bipolar disorder, unspecified: F31.9

## 2020-04-23 LAB — COMPREHENSIVE METABOLIC PANEL
ALT: 16 U/L (ref 0–44)
AST: 16 U/L (ref 15–41)
Albumin: 4 g/dL (ref 3.5–5.0)
Alkaline Phosphatase: 61 U/L (ref 38–126)
Anion gap: 11 (ref 5–15)
BUN: 12 mg/dL (ref 6–20)
CO2: 23 mmol/L (ref 22–32)
Calcium: 9.3 mg/dL (ref 8.9–10.3)
Chloride: 102 mmol/L (ref 98–111)
Creatinine, Ser: 0.66 mg/dL (ref 0.44–1.00)
GFR calc Af Amer: >60 mL/min (ref >60)
GFR calc non Af Amer: >60 mL/min (ref >60)
Glucose, Bld: 96 mg/dL (ref 70–99)
Potassium: 3.8 mmol/L (ref 3.5–5.1)
Sodium: 136 mmol/L (ref 135–145)
Total Bilirubin: 0.6 mg/dL (ref 0.3–1.2)
Total Protein: 7.1 g/dL (ref 6.5–8.1)

## 2020-04-23 LAB — WET PREP, GENITAL
Sperm: NONE SEEN
Trich, Wet Prep: NONE SEEN
Yeast Wet Prep HPF POC: NONE SEEN

## 2020-04-23 LAB — URINALYSIS, ROUTINE W REFLEX MICROSCOPIC
Bilirubin Urine: NEGATIVE
Glucose, UA: NEGATIVE mg/dL
Hgb urine dipstick: NEGATIVE
Ketones, ur: NEGATIVE mg/dL
Nitrite: NEGATIVE
Protein, ur: NEGATIVE mg/dL
Specific Gravity, Urine: 1.013 (ref 1.005–1.030)
pH: 5 (ref 5.0–8.0)

## 2020-04-23 LAB — CBC
HCT: 37.6 % (ref 36.0–46.0)
Hemoglobin: 12.7 g/dL (ref 12.0–15.0)
MCH: 29.5 pg (ref 26.0–34.0)
MCHC: 33.8 g/dL (ref 30.0–36.0)
MCV: 87.2 fL (ref 80.0–100.0)
Platelets: 257 10*3/uL (ref 150–400)
RBC: 4.31 MIL/uL (ref 3.87–5.11)
RDW: 12.3 % (ref 11.5–15.5)
WBC: 7.6 10*3/uL (ref 4.0–10.5)
nRBC: 0 % (ref 0.0–0.2)

## 2020-04-23 LAB — POCT PREGNANCY, URINE: Preg Test, Ur: POSITIVE — AB

## 2020-04-23 LAB — HCG, QUANTITATIVE, PREGNANCY: hCG, Beta Chain, Quant, S: 2257 m[IU]/mL — ABNORMAL HIGH (ref <5)

## 2020-04-23 MED ORDER — METRONIDAZOLE 500 MG PO TABS
500.0000 mg | ORAL_TABLET | Freq: Two times a day (BID) | ORAL | 0 refills | Status: DC
Start: 2020-04-23 — End: 2020-11-19

## 2020-04-23 NOTE — Discharge Instructions (Signed)
Human Chorionic Gonadotropin Test Why am I having this test? A human chorionic gonadotropin (hCG) test is done to determine whether you are pregnant. It can also be used:  To diagnose an abnormal pregnancy.  To determine whether you have had a failed pregnancy (miscarriage) or are at risk of one. What is being tested? This test checks the level of the human chorionic gonadotropin (hCG) hormone in the blood. This hormone is produced during pregnancy by the cells that form the placenta. The placenta is the organ that grows inside your womb (uterus) to nourish a developing baby. When you are pregnant, hCG can be detected in your blood or urine 7 to 8 days before your missed period. It continues to go up for the first 8-10 weeks of pregnancy. The presence of hCG in your blood can be measured with several different types of tests. You may have:  A urine test. ? Because this hormone is eliminated from your body by your kidneys, you may have a urine test to find out whether you are pregnant. A home pregnancy test detects whether there is hCG in your urine. ? A urine test only shows whether there is hCG in your urine. It does not measure how much.  A qualitative blood test. ? You may have this type of blood test to find out if you are pregnant. ? This blood test only shows whether there is hCG in your blood. It does not measure how much.  A quantitative blood test. ? This type of blood test measures the amount of hCG in your blood. ? You may have this test to:  Diagnose an abnormal pregnancy.  Check whether you have had a miscarriage.  Determine whether you are at risk of a miscarriage. What kind of sample is taken?     Two kinds of samples may be collected to test for the hCG hormone.  Blood. It is usually collected by inserting a needle into a blood vessel.  Urine. It is usually collected by urinating into a germ-free (sterile) specimen cup. It is best to collect the sample the first  time you urinate in the morning. How do I prepare for this test? No preparation is needed for a blood test.  For the urine test:  Let your health care provider know about: ? All medicines you are taking, including vitamins, herbs, creams, and over-the-counter medicines. ? Any blood in your urine. This may interfere with the result.  Do not drink too much fluid. Drink as you normally would, or as directed by your health care provider. How are the results reported? Depending on the type of test that you have, your test results may be reported as values. Your health care provider will compare your results to normal ranges that were established after testing a large group of people (reference ranges). Reference ranges may vary among labs and hospitals. For this test, common reference ranges that show absence of pregnancy are:  Quantitative hCG blood levels: less than 5 IU/L. Other results will be reported as either positive or negative. For this test, normal results (meaning the absence of pregnancy) are:  Negative for hCG in the urine test.  Negative for hCG in the qualitative blood test. What do the results mean? Urine and qualitative blood test  A negative result could mean: ? That you are not pregnant. ? That the test was done too early in your pregnancy to detect hCG in your blood or urine. If you still have other signs   of pregnancy, the test will be repeated.  A positive result means: ? That you are most likely pregnant. Your health care provider may confirm your pregnancy with an imaging study (ultrasound) of your uterus, if needed. Quantitative blood test Results of the quantitative hCG blood test will be interpreted as follows:  Less than 5 IU/L: You are most likely not pregnant.  Greater than 25 IU/L: You are most likely pregnant.  hCG levels that are higher than expected: ? You are pregnant with twins. ? You have abnormal growths in the uterus.  hCG levels that are  rising more slowly than expected: ? You have an ectopic pregnancy (also called a tubal pregnancy).  hCG levels that are falling: ? You may be having a miscarriage. Talk with your health care provider about what your results mean. Questions to ask your health care provider Ask your health care provider, or the department that is doing the test:  When will my results be ready?  How will I get my results?  What are my treatment options?  What other tests do I need?  What are my next steps? Summary  A human chorionic gonadotropin test is done to determine whether you are pregnant.  When you are pregnant, hCG can be detected in your blood or urine 7 to 8 days before your missed period. It continues to go up for the first 8-10 weeks of pregnancy.  Your hCG level can be measured with different types of tests. You may have a urine test, a qualitative blood test, or a quantitative blood test.  Talk with your health care provider about what your results mean. This information is not intended to replace advice given to you by your health care provider. Make sure you discuss any questions you have with your health care provider. Document Revised: 10/18/2017 Document Reviewed: 10/18/2017 Elsevier Patient Education  2020 Elsevier Inc.  

## 2020-04-23 NOTE — Telephone Encounter (Signed)
Left message on VM in regards to appointment scheduled on Thursday, 04/25/2020 at 2:00pm.  Asked pt to give our office a call.

## 2020-04-23 NOTE — MAU Note (Signed)
Pt presents to MAU with c/o intermittent lower abdominal pain x 3 days and rectal pressure. She had +HPT today. Unsure when last period was due to 3 month birth control. Pt denies VB.

## 2020-04-23 NOTE — MAU Provider Note (Signed)
History     CSN: [id number]  Arrival date and time: 04/23/20 1233   First Provider Initiated Contact with Patient 04/23/20 1435      Chief Complaint  Patient presents with  . Abdominal Pain   HPI Veronica Moore is a 24 y.o. G3P1011 at Unknown [place] who presents to MAU with chief complaint of abdominal pain. This is a new complaint, onset three days ago and accompanied by rectal pressure. Patient states she had a normal bowel movement this morning and experienced a reduction in discomfort. She denies vaginal bleeding, abdominal tenderness, dysuria, fever or recent illness.  OB History    Gravida  3   Para  1   Term  1   Preterm  0   AB  1   Living  1     SAB  1   TAB      Ectopic      Multiple  0   Live Births  1           Past Medical History:  Diagnosis Date  . ADHD (attention deficit hyperactivity disorder)   . Alcohol abuse, in remission   . Allergy   . 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 Bipolar 1 disorder, depressed (HCC)   . Bipolar depression (HCC)   . Bipolar disorder (HCC)   . PTSD (post-traumatic stress disorder)   . PTSD (post-traumatic stress disorder)   . Suicide threat or attempt    history of SI and history of attempt 2015    Past Surgical History:  Procedure Laterality Date  . NO PAST SURGERIES      Family History  Problem Relation Age of Onset  . ADD / ADHD Mother   . Alcohol abuse Mother   . Anxiety disorder Mother   . Bipolar disorder Mother   . Drug abuse Mother   . Seizures Mother   . Sexual abuse Mother   . ADD / ADHD Father   . Alcohol abuse Father   . Anxiety disorder Father   . Bipolar disorder Father   . Depression Father   . Drug abuse Father   . ADD / ADHD Sister   . ADD / ADHD Brother   . Dementia Maternal Grandmother   . Seizures Cousin   . Schizophrenia Cousin   . ADD / ADHD Sister     Social History   Tobacco Use  . Smoking status: Former Smoker    Packs/day: 1.00    Years: 8.00    Pack  years: 8.00    Types: Cigarettes    Quit date: 04/24/2019    Years since quitting: 1.0  . Smokeless tobacco: Never Used  . Tobacco comment: Now smoking 1-2 a day.  Substance Use Topics  . Alcohol use: Not Currently    Comment: ocassionally  . Drug use: Not Currently    Types: Marijuana    Comment: none with pregnancy    Allergies:  Allergies  Allergen Reactions  . Red Dye Anaphylaxis and Other (See Comments)    Pt is allergic to red dye 40.   Marland Kitchen Pineapple Swelling and Other (See Comments)    Reaction:  Mouth/lip swelling     No medications prior to admission.    Review of Systems  Gastrointestinal: Positive for abdominal pain and constipation.  Genitourinary: Negative for vaginal bleeding.  All other systems reviewed and are negative.  Physical Exam   Blood pressure 124/76, pulse 99, temperature 98.3 F (36.8 C), temperature source  Oral, resp. rate 18, SpO2 100 %.  Physical Exam  Nursing note and vitals reviewed. Constitutional: She appears well-developed and well-nourished.  Cardiovascular: Normal rate and normal heart sounds.  Respiratory: Effort normal and breath sounds normal.  GI: Soft. Bowel sounds are normal. She exhibits no distension. There is no abdominal tenderness. There is no rebound and no guarding.  Genitourinary:    Vaginal discharge present.     Genitourinary Comments: Blind swab by RN due to lack of vaginal bleeding. Thin white discharge suspicious for BV   Skin: Skin is warm and dry.  Psychiatric: She has a normal mood and affect. Her behavior is normal. Judgment and thought content normal.    MAU Course/MDM  Procedures  Patient Vitals for the past 24 hrs:  BP Temp Temp src Pulse Resp SpO2  04/23/20 1500 124/76 -- -- -- -- --  04/23/20 1307 120/79 98.3 F (36.8 C) Oral 99 18 100 %   Results for orders placed or performed during the hospital encounter of 04/23/20 (from the past 24 hour(s))  Pregnancy, urine POC     Status: Abnormal    Collection Time: 04/23/20 12:56 PM  Result Value Ref Range   Preg Test, Ur POSITIVE (A) NEGATIVE  Urinalysis, Routine w reflex microscopic     Status: Abnormal   Collection Time: 04/23/20 12:58 PM  Result Value Ref Range   Color, Urine YELLOW YELLOW   APPearance HAZY (A) CLEAR   Specific Gravity, Urine 1.013 1.005 - 1.030   pH 5.0 5.0 - 8.0   Glucose, UA NEGATIVE NEGATIVE mg/dL   Hgb urine dipstick NEGATIVE NEGATIVE   Bilirubin Urine NEGATIVE NEGATIVE   Ketones, ur NEGATIVE NEGATIVE mg/dL   Protein, ur NEGATIVE NEGATIVE mg/dL   Nitrite NEGATIVE NEGATIVE   Leukocytes,Ua MODERATE (A) NEGATIVE   RBC / HPF 0-5 0 - 5 RBC/hpf   WBC, UA 6-10 0 - 5 WBC/hpf   Bacteria, UA FEW (A) NONE SEEN   Squamous Epithelial / LPF 6-10 0 - 5   Mucus PRESENT   CBC     Status: None   Collection Time: 04/23/20  1:12 PM  Result Value Ref Range   WBC 7.6 4.0 - 10.5 K/uL   RBC 4.31 3.87 - 5.11 MIL/uL   Hemoglobin 12.7 12.0 - 15.0 g/dL   HCT 73.4 28.7 - 68.1 %   MCV 87.2 80.0 - 100.0 fL   MCH 29.5 26.0 - 34.0 pg   MCHC 33.8 30.0 - 36.0 g/dL   RDW 15.7 26.2 - 03.5 %   Platelets 257 150 - 400 K/uL   nRBC 0.0 0.0 - 0.2 %  Comprehensive metabolic panel     Status: None   Collection Time: 04/23/20  1:12 PM  Result Value Ref Range   Sodium 136 135 - 145 mmol/L   Potassium 3.8 3.5 - 5.1 mmol/L   Chloride 102 98 - 111 mmol/L   CO2 23 22 - 32 mmol/L   Glucose, Bld 96 70 - 99 mg/dL   BUN 12 6 - 20 mg/dL   Creatinine, Ser 5.97 0.44 - 1.00 mg/dL   Calcium 9.3 8.9 - 41.6 mg/dL   Total Protein 7.1 6.5 - 8.1 g/dL   Albumin 4.0 3.5 - 5.0 g/dL   AST 16 15 - 41 U/L   ALT 16 0 - 44 U/L   Alkaline Phosphatase 61 38 - 126 U/L   Total Bilirubin 0.6 0.3 - 1.2 mg/dL   GFR calc non Af Amer >60 >  60 mL/min   GFR calc Af Amer >60 >60 mL/min   Anion gap 11 5 - 15  hCG, quantitative, pregnancy     Status: Abnormal   Collection Time: 04/23/20  1:12 PM  Result Value Ref Range   hCG, Beta Chain, Quant, S 2,257 (H) <5  mIU/mL  Wet prep, genital     Status: Abnormal   Collection Time: 04/23/20  2:08 PM  Result Value Ref Range   Yeast Wet Prep HPF POC NONE SEEN NONE SEEN   Trich, Wet Prep NONE SEEN NONE SEEN   Clue Cells Wet Prep HPF POC PRESENT (A) NONE SEEN   WBC, Wet Prep HPF POC MANY (A) NONE SEEN   Sperm NONE SEEN    US OB Transvaginal  Result Date: 04/23/2020 CLINICAL DATA:  Abdominal pain in first trimester of pregnancy; uncertain LMP; quantitative beta HCG = 2257 today EXAM: TRANSVAGINAL OB ULTRASOUND TECHNIQUE: Transvaginal ultrasound was performed for complete evaluation of the gestation as well as the maternal uterus, adnexal regions, and pelvic cul-de-sac. COMPARISON:  None. FINDINGS: Intrauterine gestational sac: None identified Yolk sac:  N/A Embryo:  N/A Cardiac Activity: N/A Heart Rate: N/A bpm MSD:   mm    w     d CRL:     mm    w  d                  [place] EDC: Subchorionic hemorrhage:  N/A Maternal uterus/adnexae: Maternal uterus anteverted, normal in appearance. Endometrial complex 11 mm thick at upper uterine segment. No focal uterine mass, gestational sac or endometrial fluid collection. RIGHT ovary measures 3.1 x 2.3 x 3.8 cm and contains a small corpus luteum. LEFT ovary normal size and morphology, 1.3 x 2.6 x 1.1 cm. Trace free pelvic fluid. No adnexal masses. IMPRESSION: No intrauterine gestation identified. Findings are consistent with pregnancy of unknown location. Differential diagnosis includes early intrauterine pregnancy too early to visualize, spontaneous abortion, and ectopic pregnancy. Serial quantitative beta HCG and or follow-up ultrasound recommended to definitively exclude ectopic pregnancy. Electronically Signed   By: Lavonia Dana M.D.   On: 04/23/2020 14:30   Meds ordered this encounter  Medications  . metroNIDAZOLE (FLAGYL) 500 MG tablet    Sig: Take 1 tablet (500 mg total) by mouth 2 (two) times daily.    Dispense:  14 tablet    Refill:  0    Order Specific Question:    Supervising Provider    Answer:   Sloan Leiter [[id number]]   Assessment and Plan  --24 y.o. [phone] with pregnancy of unknown location --Bacterial Vaginosis --Urine culture in work --Discharge home in stable condition  F/U: --Patient desires f/u with [facility]. CNM called front desk, patient does not have history with practice and so is not eligible for stat Quant hCG with them --Patient agreeable to f/u with [facility]. Message sent to coordinate 48 hour Iantha Fallen, [place] 04/23/2020, 3:51 PM

## 2020-04-24 LAB — GC/CHLAMYDIA PROBE AMP (~~LOC~~) NOT AT ARMC
Chlamydia: NEGATIVE
Comment: NEGATIVE
Comment: NORMAL
Neisseria Gonorrhea: NEGATIVE

## 2020-04-24 LAB — CULTURE, OB URINE: Culture: NO GROWTH

## 2020-04-25 ENCOUNTER — Other Ambulatory Visit (INDEPENDENT_AMBULATORY_CARE_PROVIDER_SITE_OTHER): Payer: BC Managed Care – PPO | Admitting: *Deleted

## 2020-04-25 ENCOUNTER — Other Ambulatory Visit: Payer: Self-pay | Admitting: Obstetrics and Gynecology

## 2020-04-25 ENCOUNTER — Telehealth: Payer: Self-pay | Admitting: *Deleted

## 2020-04-25 ENCOUNTER — Other Ambulatory Visit: Payer: Self-pay

## 2020-04-25 VITALS — BP 114/70 | HR 95 | Temp 98.4°F | Ht 60.0 in | Wt 137.0 lb

## 2020-04-25 DIAGNOSIS — O3680X0 Pregnancy with inconclusive fetal viability, not applicable or unspecified: Secondary | ICD-10-CM

## 2020-04-25 LAB — BETA HCG QUANT (REF LAB): hCG Quant: 2156 m[IU]/mL

## 2020-04-25 NOTE — Telephone Encounter (Signed)
Called patient regarding STAT Beta hcg. Patient verified DOB. Informed patient that level has dropped but not a significant amount. Level on 04/23/20: 2257 and level today:2156. Per Raelyn Mora, CNM; patient should be seen Saturday 04/27/20 at MAU for STAT Beta Hcg. Patient stated understanding.  Clovis Pu, RN

## 2020-04-25 NOTE — Progress Notes (Signed)
  Ms. Veronica Moore presents to [facility] for follow-up quant hCG blood draw today. She was seen in MAU for abdominal pain on 04/23/2020. Patient denies abdominal pain or bleeding today. Discussed with patient, we are following hCG levels today. Results will be back in approximately 2 hours. Valid contact number for patient confirmed. I will call the patient with results.     Clovis Pu 04/25/2020 2:11 PM

## 2020-04-26 ENCOUNTER — Encounter (HOSPITAL_COMMUNITY): Payer: Self-pay | Admitting: Obstetrics and Gynecology

## 2020-04-26 ENCOUNTER — Inpatient Hospital Stay (HOSPITAL_COMMUNITY)
Admission: AD | Admit: 2020-04-26 | Discharge: 2020-04-27 | Disposition: A | Discharge status: Home / Self Care | Payer: BC Managed Care – PPO | Attending: Obstetrics and Gynecology | Admitting: Obstetrics and Gynecology

## 2020-04-26 ENCOUNTER — Inpatient Hospital Stay (HOSPITAL_COMMUNITY): Payer: BC Managed Care – PPO

## 2020-04-26 ENCOUNTER — Other Ambulatory Visit: Payer: Self-pay

## 2020-04-26 DIAGNOSIS — O00102 Left tubal pregnancy without intrauterine pregnancy: Secondary | ICD-10-CM

## 2020-04-26 DIAGNOSIS — Z79899 Other long term (current) drug therapy: Secondary | ICD-10-CM | POA: Insufficient documentation

## 2020-04-26 DIAGNOSIS — Z3A00 Weeks of gestation of pregnancy not specified: Secondary | ICD-10-CM | POA: Insufficient documentation

## 2020-04-26 DIAGNOSIS — O26899 Other specified pregnancy related conditions, unspecified trimester: Secondary | ICD-10-CM | POA: Insufficient documentation

## 2020-04-26 DIAGNOSIS — O009 Unspecified ectopic pregnancy without intrauterine pregnancy: Secondary | ICD-10-CM | POA: Diagnosis not present

## 2020-04-26 DIAGNOSIS — K6289 Other specified diseases of anus and rectum: Secondary | ICD-10-CM | POA: Insufficient documentation

## 2020-04-26 DIAGNOSIS — R109 Unspecified abdominal pain: Secondary | ICD-10-CM

## 2020-04-26 LAB — CBC
HCT: 37.1 % (ref 36.0–46.0)
Hemoglobin: 12.5 g/dL (ref 12.0–15.0)
MCH: 29.6 pg (ref 26.0–34.0)
MCHC: 33.7 g/dL (ref 30.0–36.0)
MCV: 87.7 fL (ref 80.0–100.0)
Platelets: 273 10*3/uL (ref 150–400)
RBC: 4.23 MIL/uL (ref 3.87–5.11)
RDW: 12.4 % (ref 11.5–15.5)
WBC: 5.7 10*3/uL (ref 4.0–10.5)
nRBC: 0 % (ref 0.0–0.2)

## 2020-04-26 LAB — COMPREHENSIVE METABOLIC PANEL
ALT: 14 U/L (ref 0–44)
AST: 16 U/L (ref 15–41)
Albumin: 3.8 g/dL (ref 3.5–5.0)
Alkaline Phosphatase: 62 U/L (ref 38–126)
Anion gap: 9 (ref 5–15)
BUN: 14 mg/dL (ref 6–20)
CO2: 23 mmol/L (ref 22–32)
Calcium: 9.5 mg/dL (ref 8.9–10.3)
Chloride: 107 mmol/L (ref 98–111)
Creatinine, Ser: 0.64 mg/dL (ref 0.44–1.00)
GFR calc Af Amer: >60 mL/min (ref >60)
GFR calc non Af Amer: >60 mL/min (ref >60)
Glucose, Bld: 106 mg/dL — ABNORMAL HIGH (ref 70–99)
Potassium: 3.9 mmol/L (ref 3.5–5.1)
Sodium: 139 mmol/L (ref 135–145)
Total Bilirubin: 0.3 mg/dL (ref 0.3–1.2)
Total Protein: 6.8 g/dL (ref 6.5–8.1)

## 2020-04-26 LAB — HCG, QUANTITATIVE, PREGNANCY: hCG, Beta Chain, Quant, S: 3208 m[IU]/mL — ABNORMAL HIGH (ref <5)

## 2020-04-26 MED ORDER — METHOTREXATE FOR ECTOPIC PREGNANCY
50.0000 mg/m2 | Freq: Once | INTRAMUSCULAR | Status: AC
  Administered 2020-04-27: 80 mg via INTRAMUSCULAR
  Filled 2020-04-26: qty 1

## 2020-04-26 NOTE — MAU Note (Addendum)
Pt here with reports of abdominal pain and intense rectal pain to the point where she was unable to sit down. Pt reports that pain started around 5:30pm and by the time she got here pain was easing up and now pain is pretty much gone. Pt is now able to sit comfortably. Pt is being followed for hcg levels and is suppose to return Saturday for repeat hcg. Pt denies vaginal bleeding, hemorrhoids or constipation.

## 2020-04-26 NOTE — MAU Provider Note (Signed)
History     CSN: [id number]  Arrival date and time: 04/26/20 3354   First Provider Initiated Contact with Patient 04/26/20 2136      Chief Complaint  Patient presents with  . Rectal Pain  . Abdominal Pain   Veronica Moore is a 24 y.o. G3P1011 at Unknown [place] as she is currently on oral contraception.  She presents today for Rectal Pain and Abdominal Pain.  She states she was having similar symptoms on Saturday and Sunday.  She states the pain started today around 5pm, but subsided at 730pm.  She states her symptoms came on "really really sharp and faded over the couple of hours."  She states she has been followed for serial hCGs, which have been abnormal.        OB History    Gravida  3   Para  1   Term  1   Preterm  0   AB  1   Living  1     SAB  1   TAB      Ectopic      Multiple  0   Live Births  1           Past Medical History:  Diagnosis Date  . ADHD (attention deficit hyperactivity disorder)   . Alcohol abuse, in remission   . Allergy   . 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 Bipolar 1 disorder, depressed (HCC)   . Bipolar depression (HCC)   . Bipolar disorder (HCC)   . IUD (intrauterine device) in place 07/01/2017   Paragard placed 05/05/17  . IUD (intrauterine device) in place 07/01/2017   Paragard IUD  . PTSD (post-traumatic stress disorder)   . PTSD (post-traumatic stress disorder)   . Suicide threat or attempt    history of SI and history of attempt 2015    Past Surgical History:  Procedure Laterality Date  . NO PAST SURGERIES      Family History  Problem Relation Age of Onset  . ADD / ADHD Mother   . Alcohol abuse Mother   . Anxiety disorder Mother   . Bipolar disorder Mother   . Drug abuse Mother   . Seizures Mother   . Sexual abuse Mother   . ADD / ADHD Father   . Alcohol abuse Father   . Anxiety disorder Father   . Bipolar disorder Father   . Depression Father   . Drug abuse Father   . ADD / ADHD Sister   . ADD /  ADHD Brother   . Dementia Maternal Grandmother   . Seizures Cousin   . Schizophrenia Cousin   . ADD / ADHD Sister     Social History   Tobacco Use  . Smoking status: Former Smoker    Packs/day: 1.00    Years: 8.00    Pack years: 8.00    Types: Cigarettes    Quit date: 04/24/2019    Years since quitting: 1.0  . Smokeless tobacco: Never Used  . Tobacco comment: Now smoking 1-2 a day.  Substance Use Topics  . Alcohol use: Not Currently    Comment: ocassionally  . Drug use: Not Currently    Types: Marijuana    Comment: none with pregnancy    Allergies:  Allergies  Allergen Reactions  . Red Dye Anaphylaxis and Other (See Comments)    Pt is allergic to red dye 40.   Marland Kitchen Pineapple Swelling and Other (See Comments)    Reaction:  Mouth/lip swelling     Medications Prior to Admission  Medication Sig Dispense Refill Last Dose  . lithium carbonate 150 MG capsule Take 30 mg by mouth 3 (three) times daily with meals.    at Not taking  . metroNIDAZOLE (FLAGYL) 500 MG tablet Take 1 tablet (500 mg total) by mouth 2 (two) times daily. 14 tablet 0     Review of Systems  Constitutional: Negative for chills and fever.  Respiratory: Negative for cough and shortness of breath.   Gastrointestinal: Negative for constipation, diarrhea, nausea and vomiting.  Genitourinary: Positive for dysuria ("Sharp pain from belly button to rectum"). Negative for difficulty urinating, vaginal bleeding and vaginal discharge.  Neurological: Negative for dizziness, light-headedness and headaches.   Physical Exam   Blood pressure 124/72, pulse 96, temperature 98.8 F (37.1 C), temperature source Oral, resp. rate 18, SpO2 98 %.  Physical Exam  Constitutional: She is oriented to person, place, and time. She appears well-developed and well-nourished. No distress.  HENT:  Head: Normocephalic and atraumatic.  Eyes: Conjunctivae are normal.  Cardiovascular: Normal rate, regular rhythm and normal heart sounds.   Respiratory: Effort normal and breath sounds normal. No respiratory distress.  Musculoskeletal:     Cervical back: Normal range of motion.  Neurological: She is alert and oriented to person, place, and time.  Skin: Skin is warm and dry.  Psychiatric: She has a normal mood and affect. Her behavior is normal.    MAU Course  Procedures Results for orders placed or performed during the hospital encounter of 04/26/20 (from the past 24 hour(s))  hCG, quantitative, pregnancy     Status: Abnormal   Collection Time: 04/26/20 10:04 PM  Result Value Ref Range   hCG, Beta Chain, Quant, S 3,208 (H) <5 mIU/mL  CBC     Status: None   Collection Time: 04/26/20 10:41 PM  Result Value Ref Range   WBC 5.7 4.0 - 10.5 K/uL   RBC 4.23 3.87 - 5.11 MIL/uL   Hemoglobin 12.5 12.0 - 15.0 g/dL   HCT 00.8 67.6 - 19.5 %   MCV 87.7 80.0 - 100.0 fL   MCH 29.6 26.0 - 34.0 pg   MCHC 33.7 30.0 - 36.0 g/dL   RDW 09.3 26.7 - 12.4 %   Platelets 273 150 - 400 K/uL   nRBC 0.0 0.0 - 0.2 %  Comprehensive metabolic panel     Status: Abnormal   Collection Time: 04/26/20 10:41 PM  Result Value Ref Range   Sodium 139 135 - 145 mmol/L   Potassium 3.9 3.5 - 5.1 mmol/L   Chloride 107 98 - 111 mmol/L   CO2 23 22 - 32 mmol/L   Glucose, Bld 106 (H) 70 - 99 mg/dL   BUN 14 6 - 20 mg/dL   Creatinine, Ser 5.80 0.44 - 1.00 mg/dL   Calcium 9.5 8.9 - 99.8 mg/dL   Total Protein 6.8 6.5 - 8.1 g/dL   Albumin 3.8 3.5 - 5.0 g/dL   AST 16 15 - 41 U/L   ALT 14 0 - 44 U/L   Alkaline Phosphatase 62 38 - 126 U/L   Total Bilirubin 0.3 0.3 - 1.2 mg/dL   GFR calc non Af Amer >60 >60 mL/min   GFR calc Af Amer >60 >60 mL/min   Anion gap 9 5 - 15    MDM Rectal Exam Ultrasound Labs: hCG, CBC, CMP Consult MTX Education   The risks of methotrexate were reviewed including failure requiring repeat dosing or  eventual surgery. She understands that methotrexate involves frequent return visits to monitor lab values and that she remains at  risk of ectopic rupture until her beta is less than assay. ?The patient opts to proceed with methotrexate.  She has no history of hepatic or renal dysfunction, has normal BUN/Cr/LFT's/platelets.  She is felt to be reliable for follow-up. Side effects of photosensitivity & GI upset were discussed.  She knows to avoid direct sunlight and abstain from alcohol, NSAIDs and sexual intercourse for two weeks. She was counseled to discontinue any MVI with folic acid. ?She understands to follow up on D4 (Tuesday) and D7 (Friday) for repeat BHCG and was given the instruction sheet.  ?Strict ectopic precautions were reviewed, the patient knows to call with any abdominal pain, vomiting, fainting, or any concerns with her health.  Day 0/1 Day 4 Day 7  Sunday Wednesday Saturday  Monday Thursday Sunday  Tuesday Friday Monday  Wednesday Saturday Tuesday  Thursday Sunday Wednesday  Friday Monday Thursday  Saturday Tuesday Friday   Assessment and Plan  24 year old G3P1011 Abnormal Rise in Quants Rectal Pain-Resolved  -POC reviewed. -Exam performed and findings discussed. -Informed that no apparent causes for rectal pressure. -Informed that due to abnormal rise in hCG would repeat today and send for [place]. -Patient expresses concern for ectopic pregnancy vs miscarriage. -Encouraged to await results.   Maryann Conners 04/26/2020, 9:36 PM   Reassessment (10:41 PM)  -Radiologist calls and reports patient with Left Adnexa Ectopic with some free fluid. -Lab contacted and CBC and CMP added-on to orders.  -Chart reviewed and partner to make recommendation for MTX seems reliable for follow-up.  -Dr. Sheryn Bison consulted and updated on patient status and results. *Advises that if quant is less than 5k and H/H stable then okay for MTX. *Images reviewed and of no concern for free fluid.  -Provider to bedside to review results with patient and SO.  -Discussed POC.  Patient and SO without questions or concerns.   -Labs Pending  Reassessment (11:51 PM) -CBC returns normal and hCG at 3208 -MTX ordered -Provider at bedside to discuss MTX dosing and answer questions. -Patient tearful and reassured that she did not cause ectopic pregnancy. -SO very supportive and also offers reassurance. -Discussed need for follow up as well as possibility of repeat dosing and/or surgical intervention.  -Addressed questions regarding ectopic location, possible causes, and future pregnancy. -Provider will return to bedside for further counseling and support after injection.   Reassessment (12:23 AM)  -S/P MTX injection. -Provider to bedside to review ectopic precautions. -Patient instructions given and questions/concerns addressed. -Informed that risk of rupture still present and encouraged to call or report back to MAU for any concerns or sudden onset of symptoms. -No other questions/concerns. -Discharged to home in stable condition.  Maryann Conners MSN, CNM Advanced Practice Provider, [facility]

## 2020-04-27 DIAGNOSIS — O26899 Other specified pregnancy related conditions, unspecified trimester: Secondary | ICD-10-CM | POA: Diagnosis not present

## 2020-04-27 NOTE — Discharge Instructions (Signed)
Ectopic Pregnancy ° °An ectopic pregnancy is when the fertilized egg attaches (implants) outside the uterus. Most ectopic pregnancies occur in one of the tubes where eggs travel from the ovary to the uterus (fallopian tubes), but the implanting can occur in other locations. In rare cases, ectopic pregnancies occur on the ovary, intestine, pelvis, abdomen, or cervix. In an ectopic pregnancy, the fertilized egg does not have the ability to develop into a normal, healthy baby. °A ruptured ectopic pregnancy is one in which tearing or bursting of a fallopian tube causes internal bleeding. Often, there is intense lower abdominal pain, and vaginal bleeding sometimes occurs. Having an ectopic pregnancy can be life-threatening. If this dangerous condition is not treated, it can lead to blood loss, shock, or even death. °What are the causes? °The most common cause of this condition is damage to one of the fallopian tubes. A fallopian tube may be narrowed or blocked, and that keeps the fertilized egg from reaching the uterus. °What increases the risk? °This condition is more likely to develop in women of childbearing age who have different levels of risk. The levels of risk can be divided into three categories. °High risk °· You have gone through infertility treatment. °· You have had an ectopic pregnancy before. °· You have had surgery on the fallopian tubes, or another surgical procedure, such as an abortion. °· You have had surgery to have the fallopian tubes tied (tubal ligation). °· You have problems or diseases of the fallopian tubes. °· You have been exposed to diethylstilbestrol (DES). This medicine was used until 1971, and it had effects on babies whose mothers took the medicine. °· You become pregnant while using an IUD (intrauterine device) for birth control. °Moderate risk °· You have a history of infertility. °· You have had an STI (sexually transmitted infection). °· You have a history of pelvic inflammatory  disease (PID). °· You have scarring from endometriosis. °· You have multiple sexual partners. °· You smoke. °Low risk °· You have had pelvic surgery. °· You use vaginal douches. °· You became sexually active before age 18. °What are the signs or symptoms? °Common symptoms of this condition include normal pregnancy symptoms, such as missing a period, nausea, tiredness, abdominal pain, breast tenderness, and bleeding. However, ectopic pregnancy will have additional symptoms, such as: °· Pain with intercourse. °· Irregular vaginal bleeding or spotting. °· Cramping or pain on one side or in the lower abdomen. °· Fast heartbeat, low blood pressure, and sweating. °· Passing out while having a bowel movement. °Symptoms of a ruptured ectopic pregnancy and internal bleeding may include: °· Sudden, severe pain in the abdomen and pelvis. °· Dizziness, weakness, light-headedness, or fainting. °· Pain in the shoulder or neck area. °How is this diagnosed? °This condition is diagnosed by: °· A pelvic exam to locate pain or a mass in the abdomen. °· A pregnancy test. This blood test checks for the presence as well as the specific level of pregnancy hormone in the bloodstream. °· Ultrasound. This is performed if a pregnancy test is positive. In this test, a probe is inserted into the vagina. The probe will detect a fetus, possibly in a location other than the uterus. °· Taking a sample of uterus tissue (dilation and curettage, or D&C). °· Surgery to perform a visual exam of the inside of the abdomen using a thin, lighted tube that has a tiny camera on the end (laparoscope). °· Culdocentesis. This procedure involves inserting a needle at the top of   the vagina, behind the uterus. If blood is present in this area, it may indicate that a fallopian tube is torn. How is this treated? This condition is treated with medicine or surgery. Medicine  An injection of a medicine (methotrexate) may be given to cause the pregnancy tissue to be  absorbed. This medicine may save your fallopian tube. It may be given if: ? The diagnosis is made early, with no signs of active bleeding. ? The fallopian tube has not ruptured. ? You are considered to be a good candidate for the medicine. Usually, pregnancy hormone blood levels are checked after methotrexate treatment. This is to be sure that the medicine is effective. It may take 4-6 weeks for the pregnancy to be absorbed. Most pregnancies will be absorbed by 3 weeks. Surgery  A laparoscope may be used to remove the pregnancy tissue.  If severe internal bleeding occurs, a larger cut (incision) may be made in the lower abdomen (laparotomy) to remove the fetus and placenta. This is done to stop the bleeding.  Part or all of the fallopian tube may be removed (salpingectomy) along with the fetus and placenta. The fallopian tube may also be repaired during the surgery.  In very rare circumstances, removal of the uterus (hysterectomy) may be required.  After surgery, pregnancy hormone testing may be done to be sure that there is no pregnancy tissue left. Whether your treatment is medicine or surgery, you may receive a Rho (D) immune globulin shot to prevent problems with any future pregnancy. This shot may be given if:  You are Rh-negative and the baby's father is Rh-positive.  You are Rh-negative and you do not know the Rh type of the baby's father. Follow these instructions at home:  Rest and limit your activity after the procedure for as long as told by your health care provider.  Until your health care provider says that it is safe: ? Do not lift anything that is heavier than 10 lb (4.5 kg), or the limit that your health care provider tells you. ? Avoid physical exercise and any movement that requires effort (is strenuous).  To help prevent constipation: ? Eat a healthy diet that includes fruits, vegetables, and whole grains. ? Drink 6-8 glasses of water per day. Get help right away  if:  You develop worsening pain that is not relieved by medicine.  You have: ? A fever or chills. ? Vaginal bleeding. ? Redness and swelling at the incision site. ? Nausea and vomiting.  You feel dizzy or weak.  You feel light-headed or you faint. This information is not intended to replace advice given to you by your health care provider. Make sure you discuss any questions you have with your health care provider. Document Revised: 10/29/2017 Document Reviewed: 06/17/2016 Elsevier Patient Education  Veronica [employer].  Methotrexate Treatment for an Ectopic Pregnancy, Care After This sheet gives you information about how to care for yourself after your procedure. Your health care provider may also give you more specific instructions. If you have problems or questions, contact your health care provider. What can I expect after the procedure? After the procedure, it is common to have:  Abdominal cramping.  Vaginal bleeding.  Fatigue.  Nausea.  Vomiting.  Diarrhea. Blood tests will be taken at timed intervals for several days or weeks to check your pregnancy hormone levels. The blood tests will be done until the pregnancy hormone can no longer be detected in the blood. Follow these instructions at home: Activity  Do not have sex until your health care provider approves.  Limit activities that take a lot of effort as told by your health care provider. Medicines  Take over the counter and prescription medicines only as told by your health care provider.  Do not take aspirin, ibuprofen, naproxen, or any other NSAIDs.  Do not take folic acid, prenatal vitamins, or other vitamins that contain folic acid. General instructions   Do not drink alcohol.  Follow instructions from your health care provider on how and when to report any symptoms that may indicate a ruptured ectopic pregnancy.  Keep all follow-up visits as told by your health care provider. This is  important. Contact a health care provider if:  You have persistent nausea and vomiting.  You have persistent diarrhea.  You are having a reaction to the medicine, such as: ? Tiredness. ? Skin rash. ? Hair loss. Get help right away if:  Your abdominal or pelvic pain gets worse.  You have more vaginal bleeding.  You feel light-headed or you faint.  You have shortness of breath.  Your heart rate increases.  You develop a cough.  You have chills.  You have a fever. Summary  After the procedure, it is common to have symptoms of abdominal cramping, vaginal bleeding and fatigue. You may also experience other symptoms.  Blood tests will be taken at timed intervals for several days or weeks to check your pregnancy hormone levels. The blood tests will be done until the pregnancy hormone can no longer be detected in the blood.  Limit strenuous activity as told by your health care provider.  Follow instructions from your health care provider on how and when to report any symptoms that may indicate a ruptured ectopic pregnancy. This information is not intended to replace advice given to you by your health care provider. Make sure you discuss any questions you have with your health care provider. Document Revised: 10/29/2017 Document Reviewed: 01/05/2017 Elsevier Patient Education  2020 [employer].

## 2020-04-30 ENCOUNTER — Inpatient Hospital Stay (HOSPITAL_COMMUNITY)
Admission: AD | Admit: 2020-04-30 | Discharge: 2020-04-30 | Disposition: A | Discharge status: Home / Self Care | Payer: BC Managed Care – PPO | Attending: Obstetrics & Gynecology | Admitting: Obstetrics & Gynecology

## 2020-04-30 ENCOUNTER — Other Ambulatory Visit: Payer: Self-pay

## 2020-04-30 ENCOUNTER — Inpatient Hospital Stay (HOSPITAL_COMMUNITY): Payer: BC Managed Care – PPO

## 2020-04-30 DIAGNOSIS — O0900 Supervision of pregnancy with history of infertility, unspecified trimester: Secondary | ICD-10-CM | POA: Insufficient documentation

## 2020-04-30 DIAGNOSIS — Z3A00 Weeks of gestation of pregnancy not specified: Secondary | ICD-10-CM | POA: Insufficient documentation

## 2020-04-30 DIAGNOSIS — O00102 Left tubal pregnancy without intrauterine pregnancy: Secondary | ICD-10-CM | POA: Diagnosis not present

## 2020-04-30 DIAGNOSIS — O009 Unspecified ectopic pregnancy without intrauterine pregnancy: Secondary | ICD-10-CM | POA: Diagnosis present

## 2020-04-30 DIAGNOSIS — Z6790 Unspecified blood type, Rh positive: Secondary | ICD-10-CM

## 2020-04-30 DIAGNOSIS — O00202 Left ovarian pregnancy without intrauterine pregnancy: Secondary | ICD-10-CM

## 2020-04-30 LAB — HCG, QUANTITATIVE, PREGNANCY: hCG, Beta Chain, Quant, S: 4852 m[IU]/mL — ABNORMAL HIGH (ref <5)

## 2020-04-30 MED ORDER — METHOTREXATE FOR ECTOPIC PREGNANCY
50.0000 mg/m2 | Freq: Once | INTRAMUSCULAR | Status: AC
  Administered 2020-04-30: 80 mg via INTRAMUSCULAR
  Filled 2020-04-30: qty 1

## 2020-04-30 NOTE — MAU Note (Signed)
Has been having sharp sudden pain in LLQ, don't last long.  Has been dizzy and feeling bloated. Denies any bleeding. Has been really hot and sweaty.

## 2020-04-30 NOTE — Discharge Instructions (Signed)
Ectopic Pregnancy ° °An ectopic pregnancy is when the fertilized egg attaches (implants) outside the uterus. Most ectopic pregnancies occur in one of the tubes where eggs travel from the ovary to the uterus (fallopian tubes), but the implanting can occur in other locations. In rare cases, ectopic pregnancies occur on the ovary, intestine, pelvis, abdomen, or cervix. In an ectopic pregnancy, the fertilized egg does not have the ability to develop into a normal, healthy baby. °A ruptured ectopic pregnancy is one in which tearing or bursting of a fallopian tube causes internal bleeding. Often, there is intense lower abdominal pain, and vaginal bleeding sometimes occurs. Having an ectopic pregnancy can be life-threatening. If this dangerous condition is not treated, it can lead to blood loss, shock, or even death. °What are the causes? °The most common cause of this condition is damage to one of the fallopian tubes. A fallopian tube may be narrowed or blocked, and that keeps the fertilized egg from reaching the uterus. °What increases the risk? °This condition is more likely to develop in women of childbearing age who have different levels of risk. The levels of risk can be divided into three categories. °High risk °· You have gone through infertility treatment. °· You have had an ectopic pregnancy before. °· You have had surgery on the fallopian tubes, or another surgical procedure, such as an abortion. °· You have had surgery to have the fallopian tubes tied (tubal ligation). °· You have problems or diseases of the fallopian tubes. °· You have been exposed to diethylstilbestrol (DES). This medicine was used until 1971, and it had effects on babies whose mothers took the medicine. °· You become pregnant while using an IUD (intrauterine device) for birth control. °Moderate risk °· You have a history of infertility. °· You have had an STI (sexually transmitted infection). °· You have a history of pelvic inflammatory  disease (PID). °· You have scarring from endometriosis. °· You have multiple sexual partners. °· You smoke. °Low risk °· You have had pelvic surgery. °· You use vaginal douches. °· You became sexually active before age 18. °What are the signs or symptoms? °Common symptoms of this condition include normal pregnancy symptoms, such as missing a period, nausea, tiredness, abdominal pain, breast tenderness, and bleeding. However, ectopic pregnancy will have additional symptoms, such as: °· Pain with intercourse. °· Irregular vaginal bleeding or spotting. °· Cramping or pain on one side or in the lower abdomen. °· Fast heartbeat, low blood pressure, and sweating. °· Passing out while having a bowel movement. °Symptoms of a ruptured ectopic pregnancy and internal bleeding may include: °· Sudden, severe pain in the abdomen and pelvis. °· Dizziness, weakness, light-headedness, or fainting. °· Pain in the shoulder or neck area. °How is this diagnosed? °This condition is diagnosed by: °· A pelvic exam to locate pain or a mass in the abdomen. °· A pregnancy test. This blood test checks for the presence as well as the specific level of pregnancy hormone in the bloodstream. °· Ultrasound. This is performed if a pregnancy test is positive. In this test, a probe is inserted into the vagina. The probe will detect a fetus, possibly in a location other than the uterus. °· Taking a sample of uterus tissue (dilation and curettage, or D&C). °· Surgery to perform a visual exam of the inside of the abdomen using a thin, lighted tube that has a tiny camera on the end (laparoscope). °· Culdocentesis. This procedure involves inserting a needle at the top of   the vagina, behind the uterus. If blood is present in this area, it may indicate that a fallopian tube is torn. How is this treated? This condition is treated with medicine or surgery. Medicine  An injection of a medicine (methotrexate) may be given to cause the pregnancy tissue to be  absorbed. This medicine may save your fallopian tube. It may be given if: ? The diagnosis is made early, with no signs of active bleeding. ? The fallopian tube has not ruptured. ? You are considered to be a good candidate for the medicine. Usually, pregnancy hormone blood levels are checked after methotrexate treatment. This is to be sure that the medicine is effective. It may take 4-6 weeks for the pregnancy to be absorbed. Most pregnancies will be absorbed by 3 weeks. Surgery  A laparoscope may be used to remove the pregnancy tissue.  If severe internal bleeding occurs, a larger cut (incision) may be made in the lower abdomen (laparotomy) to remove the fetus and placenta. This is done to stop the bleeding.  Part or all of the fallopian tube may be removed (salpingectomy) along with the fetus and placenta. The fallopian tube may also be repaired during the surgery.  In very rare circumstances, removal of the uterus (hysterectomy) may be required.  After surgery, pregnancy hormone testing may be done to be sure that there is no pregnancy tissue left. Whether your treatment is medicine or surgery, you may receive a Rho (D) immune globulin shot to prevent problems with any future pregnancy. This shot may be given if:  You are Rh-negative and the baby's father is Rh-positive.  You are Rh-negative and you do not know the Rh type of the baby's father. Follow these instructions at home:  Rest and limit your activity after the procedure for as long as told by your health care provider.  Until your health care provider says that it is safe: ? Do not lift anything that is heavier than 10 lb (4.5 kg), or the limit that your health care provider tells you. ? Avoid physical exercise and any movement that requires effort (is strenuous).  To help prevent constipation: ? Eat a healthy diet that includes fruits, vegetables, and whole grains. ? Drink 6-8 glasses of water per day. Get help right away  if:  You develop worsening pain that is not relieved by medicine.  You have: ? A fever or chills. ? Vaginal bleeding. ? Redness and swelling at the incision site. ? Nausea and vomiting.  You feel dizzy or weak.  You feel light-headed or you faint. This information is not intended to replace advice given to you by your health care provider. Make sure you discuss any questions you have with your health care provider. Document Revised: 10/29/2017 Document Reviewed: 06/17/2016 Elsevier Patient Education  2020 Else[employer] Inc[employer]         Methotrexate Treatment for an Ectopic Pregnancy  Methotrexate is a medicine that treats an ectopic pregnancy. An ectopic pregnancy is a pregnancy in which the fetus develops outside the uterus. This kind of pregnancy can be dangerous. Methotrexate works by stopping the growth of the fertilized egg. It also helps your body absorb tissue from the egg. This takes between 2-6 weeks. Most ectopic pregnancies can be successfully treated with methotrexate if they are diagnosed early. Tell a health care provider about:  Any allergies you have.  All medicines you are taking, including vitamins, herbs, eye drops, creams, and over-the-counter medicines.  Any medical conditions you have. What  are the risks? Generally, this is a safe treatment. However, problems may occur, including:  Nausea or vomiting or both.  Vaginal bleeding or spotting.  Diarrhea.  Abdominal cramping.  Dizziness or feeling lightheaded.  Mouth sores.  Swelling or irritation of the lining of your lungs (pneumonitis).  Liver damage.  Hair loss. There is a risk that methotrexate treatment will fail and your pregnancy will continue. There is also a risk that the ectopic pregnancy might rupture while you are using this medicine. What happens before the procedure?  Liver tests, kidney tests, and a complete blood test will be done.  Blood tests will be done to measure the pregnancy  hormone levels and to determine your blood type.  If you are Rh-negative and the father is Rh-positive or his Rh type is not known, you will be given a Rho (D) immune globulin shot. What happens during the procedure? Your health care provider may give you methotrexate by injection or in the form of a pill. Methotrexate may be given as a single dose of medicine or a series of doses, depending on your response to the treatment.  Methotrexate injections will be given by your health care provider. This is the most common way that methotrexate is used to treat an ectopic pregnancy.  If you are prescribed oral methotrexate, it is very important that you follow your health care provider's instructions on how to take oral methotrexate. Additional medicines may be needed to manage an ectopic pregnancy. The procedure may vary among health care providers and hospitals. What happens after the procedure?  You may have abdominal cramping, vaginal bleeding, and fatigue.  Blood tests will be taken at timed intervals for several days or weeks to check your pregnancy hormone levels. The blood tests will be done until the pregnancy hormone can no longer be detected in the blood.  You may need to have a surgical procedure to remove the ectopic pregnancy if methotrexate treatment fails.  Follow instructions from your health care provider on how and when to report any symptoms that may indicate a ruptured ectopic pregnancy. Summary  Methotrexate is a medicine that treats an ectopic pregnancy.  Methotrexate may be given in a single dose or a series of doses over time.  Blood tests will be taken at timed intervals for several days or weeks to check your pregnancy hormone levels. The blood tests will be done until no more pregnancy hormone is detected in the blood.  There is a risk that methotrexate treatment will fail and your pregnancy will continue. There is also a risk that the ectopic pregnancy might rupture  while you are using this medicine. This information is not intended to replace advice given to you by your health care provider. Make sure you discuss any questions you have with your health care provider. Document Revised: 10/29/2017 Document Reviewed: 01/05/2017 Elsevier Patient Education  2020 Else[employer] Inc.         Ruptured Ectopic Pregnancy  An ectopic pregnancy is when a fertilized egg attaches (implants) outside of the uterus, usually in a fallopian tube. A ruptured ectopic pregnancy is when the fallopian tube tears or bursts. This results in internal bleeding, intense abdominal pain, and sometimes, vaginal bleeding. Most ectopic pregnancies occur in the fallopian tube. In rare cases, it may occur on the ovary, intestine, pelvis, or cervix. An ectopic pregnancy does not have the ability to develop into a normal, healthy baby. A ruptured ectopic pregnancy can affect your ability to have children (fertility),  depending on damage it causes to your reproductive organs. Ruptured ectopic pregnancy is a medical emergency. If not treated immediately, it can lead to blood loss, shock, or even death. What are the causes? Most ectopic pregnancies are caused by damage to the fallopian tubes. The damage prevents the fertilized egg from implanting in the uterus. In some cases, the cause may not be known. What increases the risk? You are at increased risk for an ectopic pregnancy if:  You have had a previous ectopic pregnancy.  You have had previous fallopian tube surgery.  You have had previous surgery to have the fallopian tubes tied (tubal ligation).  You have had infertility treatments or have a history of infertility.  You have been exposed to DES. DES is a medicine that was used until 1971 and had effects on babies whose mothers took the medicine.  You use an IUD (intrauterine device) for birth control.  You use progestin-only oral contraception for birth control.  You have a  history of pelvic inflammatory disease (PID).  You have a history of endometriosis.  You smoke.  You became sexually active before 26 years of age.  You have multiple sexual partners. What are the signs or symptoms? Symptoms of a ruptured ectopic pregnancy and internal bleeding may include:  Sudden, severe pain in the abdomen and pelvis.  Dizziness or fainting.  Pain in the shoulder area.  Vaginal bleeding. How is this diagnosed? This condition is diagnosed based on your medical history, symptoms, a physical exam, and tests, which may include:  A pregnancy test.  An ultrasound.  Measuring the levels of the pregnancy hormone in the bloodstream.  Taking a sample of tissue from the uterus (dilation and curettage, D&C).  Surgery to visually examine the inside of the abdomen using a lighted tube (laparoscopy). How is this treated? This condition is treated with IV fluids and emergency surgery to remove the ectopic pregnancy and repair the area where the rupture occured. If you have lost a lot of blood, you may need a blood transfusion. If you are Rh negative and your baby's father is Rh positive, or the Rh type of the father is unknown, you may receive a Rho (D) immune globulin shot. This is to prevent Rh problems in future pregnancies. Additional medicines may be given. Get help right away if:  You are taking medicines to treat an ectopic pregnancy and you develop symptoms of a rupture. These include: ? Fever or chills. ? Shoulder pain. ? Vaginal bleeding. ? Nausea and vomiting. ? Severe abdominal pain or cramping. ? Feeling light-headed or fainting. Summary  An ectopic pregnancy is when a fertilized egg attaches (implants) outside of the uterus, usually in a fallopian tube. A ruptured ectopic pregnancy is when the fallopian tube tears or bursts.  Ruptured ectopic pregnancy is a medical emergency. If not treated immediately, it can lead to blood loss, shock, or even  death.  This condition is treated with IV fluids and emergency surgery to remove the ectopic pregnancy and repair the area where the rupture occured. If you have lost a lot of blood, you may need a blood transfusion. This information is not intended to replace advice given to you by your health care provider. Make sure you discuss any questions you have with your health care provider. Document Revised: 10/29/2017 Document Reviewed: 02/03/2017 Elsevier Patient Education  2020 Elsevier Inc.         Methotrexate Treatment for an Ectopic Pregnancy, Care After This sheet gives you  information about how to care for yourself after your procedure. Your health care provider may also give you more specific instructions. If you have problems or questions, contact your health care provider. What can I expect after the procedure? After the procedure, it is common to have:  Abdominal cramping.  Vaginal bleeding.  Fatigue.  Nausea.  Vomiting.  Diarrhea. Blood tests will be taken at timed intervals for several days or weeks to check your pregnancy hormone levels. The blood tests will be done until the pregnancy hormone can no longer be detected in the blood. Follow these instructions at home: Activity  Do not have sex until your health care provider approves.  Limit activities that take a lot of effort as told by your health care provider. Medicines  Take over the counter and prescription medicines only as told by your health care provider.  Do not take aspirin, ibuprofen, naproxen, or any other NSAIDs.  Do not take folic acid, prenatal vitamins, or other vitamins that contain folic acid. General instructions   Do not drink alcohol.  Follow instructions from your health care provider on how and when to report any symptoms that may indicate a ruptured ectopic pregnancy.  Keep all follow-up visits as told by your health care provider. This is important. Contact a health care  provider if:  You have persistent nausea and vomiting.  You have persistent diarrhea.  You are having a reaction to the medicine, such as: ? Tiredness. ? Skin rash. ? Hair loss. Get help right away if:  Your abdominal or pelvic pain gets worse.  You have more vaginal bleeding.  You feel light-headed or you faint.  You have shortness of breath.  Your heart rate increases.  You develop a cough.  You have chills.  You have a fever. Summary  After the procedure, it is common to have symptoms of abdominal cramping, vaginal bleeding and fatigue. You may also experience other symptoms.  Blood tests will be taken at timed intervals for several days or weeks to check your pregnancy hormone levels. The blood tests will be done until the pregnancy hormone can no longer be detected in the blood.  Limit strenuous activity as told by your health care provider.  Follow instructions from your health care provider on how and when to report any symptoms that may indicate a ruptured ectopic pregnancy. This information is not intended to replace advice given to you by your health care provider. Make sure you discuss any questions you have with your health care provider. Document Revised: 10/29/2017 Document Reviewed: 01/05/2017 Elsevier Patient Education  [place].

## 2020-04-30 NOTE — MAU Provider Note (Signed)
Subjective:  Veronica Moore is a 24 y.o. G3P1011 at Unknown who presents today for FU BHCG. She was seen on 04/26/2020 and diagnosed with a left ectopic pregnancy and given MTX. Results from that day show left adnexal ectopic pregnancy with possible yolk sac on [place], and HCG 3208. She denies vaginal bleeding. She reports pelvic pain, intermittent, sharp on the left side that she rates as 5/10 when present and endorses mild nausea that has improved since she found out she was pregnant.   Objective:  Physical Exam  Nursing note and vitals reviewed.  Patient Vitals for the past 24 hrs:  BP Temp Temp src Pulse Resp SpO2  04/30/20 1743 121/75 98.8 F (37.1 C) Oral 91 18 100 %  04/30/20 1742 - - - - - 100 %   Constitutional: She is oriented to person, place, and time. She appears well-developed and well-nourished. No distress.  HENT:  Head: Normocephalic.  Cardiovascular: Normal rate.  Respiratory: Effort normal.  GI: Soft. There is no tenderness.  Neurological: She is alert and oriented to person, place, and time. Skin: Skin is warm and dry.  Psychiatric: She has a normal mood and affect.   Results for orders placed or performed during the hospital encounter of 04/30/20 (from the past 24 hour(s))  hCG, quantitative, pregnancy     Status: Abnormal   Collection Time: 04/30/20  5:34 PM  Result Value Ref Range   hCG, Beta Chain, Quant, S 4,852 (H) <5 mIU/mL   Assessment/Plan: Left ectopic pregnancy Day 4 hCG s/p MTX HCG rose from 3,208 --> 4,852 US shows slightly smaller left ectopic pregnancy with yolk sac Consulted with Dr. Adrian Blackwater, will give second dose of MTX today Day 4 hCG to be drawn on Friday 05/03/2020 and Day 7 hCG drawn on Monday 05/06/2020, message sent to [facility] to schedule per pt request MTX precautions reviewed Ectopic precautions reviewed Pt discharged to home in stable condition  Nugent, Odie Sera, NP  8:34 PM 04/30/2020

## 2020-04-30 NOTE — MAU Note (Signed)
Day 4 post MTX, BHCG check

## 2020-04-30 NOTE — MAU Note (Signed)
Paper copy of AVS signed.

## 2020-05-03 ENCOUNTER — Other Ambulatory Visit (INDEPENDENT_AMBULATORY_CARE_PROVIDER_SITE_OTHER): Payer: BC Managed Care – PPO | Admitting: *Deleted

## 2020-05-03 ENCOUNTER — Other Ambulatory Visit: Payer: Self-pay

## 2020-05-03 ENCOUNTER — Telehealth: Payer: Self-pay | Admitting: Student

## 2020-05-03 VITALS — BP 118/71 | HR 85 | Temp 99.0°F | Wt 137.0 lb

## 2020-05-03 DIAGNOSIS — O00202 Left ovarian pregnancy without intrauterine pregnancy: Secondary | ICD-10-CM

## 2020-05-03 LAB — BETA HCG QUANT (REF LAB): hCG Quant: 3284 m[IU]/mL

## 2020-05-03 NOTE — Progress Notes (Signed)
   Ms. Veronica Moore presents to [facility] for follow-up quant hCG blood draw today. She was seen in MAU for abdominal pain and rectal pressure x 3 day on 04/23/2020. Patient was also seen on 04/26/2020 and 04/30/2020. Patient was diagnosed with left ectopic pregnancy and given dose #1 MTX on 5/28/202; Beta Hcg: 3208. Dose #2 MTX given on 04/30/20; Beta Hcg: 4852.Patient endorses abdominal cramping lasting 3-5 seconds and denies bleeding today. Discussed with patient, we are following hCG levels today. Results will be back in approximately 2 hours. Valid contact number for patient confirmed. I will call the patient with results.     Clovis Pu 05/03/2020 8:40 AM

## 2020-05-03 NOTE — Telephone Encounter (Signed)
Patient called and verified her identity via birth date and last 4 of her SSN.  Patient agreeable to results via phone and was informed of HCG.  Patient is day 4 s/p second dose of methotrexate. HCG dropping appropriately from 4852 down to 3284. She has day 7 lab appointment at [facility] on Monday.  Reviewed ectopic precautions & reasons to present to MAU. Questions answered & patient agreeable with plan.   Today's labs reviewed with Dr. Debroah Loop.    Judeth Horn, NP

## 2020-05-06 ENCOUNTER — Other Ambulatory Visit: Payer: Self-pay

## 2020-05-06 ENCOUNTER — Other Ambulatory Visit (INDEPENDENT_AMBULATORY_CARE_PROVIDER_SITE_OTHER): Payer: BC Managed Care – PPO | Admitting: *Deleted

## 2020-05-06 DIAGNOSIS — O00202 Left ovarian pregnancy without intrauterine pregnancy: Secondary | ICD-10-CM

## 2020-05-06 LAB — BETA HCG QUANT (REF LAB): hCG Quant: 2480 m[IU]/mL

## 2020-05-06 NOTE — Progress Notes (Signed)
   [name] presents to [facility] for follow-up quant hCG blood draw today. She was seen in MAU for pelvic pain on 04/23/20; 04/26/20 and 04/30/20. Patient endorses pelvic cramping or vaginal bleeding for about 1 hour 05/05/20. Discussed with patient, we are following hCG levels today. Results will be back in approximately 2 hours. Valid contact number for patient confirmed. I will call the patient with results.   Results and patient history reviewed with Judeth Horn, NP. Patient called and informed of plan for follow-up. Patient to have weekly non-stat beta hcg. Patient informed that beta hcg levels are dropping appropriatley from 4852 down to 3284 down to 2480 today. Advised patient on ectopic precautions and when to return to MAU.   Clovis Pu 05/06/2020 8:33 AM

## 2020-05-13 ENCOUNTER — Other Ambulatory Visit: Payer: Self-pay

## 2020-05-13 ENCOUNTER — Other Ambulatory Visit (INDEPENDENT_AMBULATORY_CARE_PROVIDER_SITE_OTHER): Payer: BC Managed Care – PPO | Admitting: *Deleted

## 2020-05-13 DIAGNOSIS — O00202 Left ovarian pregnancy without intrauterine pregnancy: Secondary | ICD-10-CM

## 2020-05-13 NOTE — Progress Notes (Signed)
   Ms. Veronica Moore presents to [facility] for follow-up quant hCG blood draw today. She was seen in MAU for abdominal pain on 04/23/20. Patient endorses vaginal bleeding today.  Bleeding started Friday; per patient like a normal period. Discussed with patient, we are following hCG levels today. Valid contact number for patient confirmed. I will call the patient with results.   Please see previous office visit notes.   Clovis Pu 05/13/2020 8:55 AM

## 2020-05-14 LAB — BETA HCG QUANT (REF LAB): hCG Quant: 1658 m[IU]/mL

## 2020-05-15 ENCOUNTER — Telehealth: Payer: Self-pay | Admitting: *Deleted

## 2020-05-15 NOTE — Progress Notes (Signed)
Weekly routine HCG levels required until they reach ZERO -per consult with Dr. Raynelle Dick.

## 2020-05-15 NOTE — Telephone Encounter (Signed)
Call patient regarding Beta Hcg level. Patient verified DOB. Level is dropping, will need weekly lab until 0.  Clovis Pu, RN

## 2020-05-20 ENCOUNTER — Other Ambulatory Visit (INDEPENDENT_AMBULATORY_CARE_PROVIDER_SITE_OTHER): Payer: BC Managed Care – PPO | Admitting: *Deleted

## 2020-05-20 ENCOUNTER — Other Ambulatory Visit: Payer: Self-pay

## 2020-05-20 DIAGNOSIS — O00202 Left ovarian pregnancy without intrauterine pregnancy: Secondary | ICD-10-CM

## 2020-05-20 NOTE — Progress Notes (Signed)
   Ms. Veronica Moore presents to [facility] for follow-up quant hCG blood draw today. She was seen in MAU for abdominal pain on 04/23/20. Patient denies abdominal pain or bleeding today. Discussed with patient, we are following non stat- hCG level today. Valid contact number for patient confirmed. I will call the patient with results.    Clovis Pu 05/20/2020 9:13 AM

## 2020-05-21 LAB — BETA HCG QUANT (REF LAB): hCG Quant: 1191 m[IU]/mL

## 2020-05-23 ENCOUNTER — Telehealth: Payer: Self-pay | Admitting: Obstetrics and Gynecology

## 2020-05-23 NOTE — Telephone Encounter (Signed)
TC from patient at 1650 asking if she would need another MTX injection since her HCG levels are not dropping by 50%. Consult with Dr. Jolayne Panther @ 1655 - notified of patient's questions,  lab & U/S results, tx plan continue with weekly HCG levels -  agrees with plan.  TC to patient at 1700 to notify her that her HCG levels are dropping appropriately and we need to continue getting them on a weekly basis until the number reaches ZERO and she needs to avoid pregnancy by abstaining or using condoms each and every time. Patient verbalized an understanding of the plan of care and agrees.   Raelyn Mora, CNM

## 2020-05-25 ENCOUNTER — Inpatient Hospital Stay (HOSPITAL_COMMUNITY)
Admission: AD | Admit: 2020-05-25 | Discharge: 2020-05-26 | Disposition: A | Discharge status: Home / Self Care | Payer: BC Managed Care – PPO | Attending: Obstetrics and Gynecology | Admitting: Obstetrics and Gynecology

## 2020-05-25 DIAGNOSIS — K6289 Other specified diseases of anus and rectum: Secondary | ICD-10-CM | POA: Insufficient documentation

## 2020-05-25 DIAGNOSIS — Z87891 Personal history of nicotine dependence: Secondary | ICD-10-CM | POA: Insufficient documentation

## 2020-05-25 DIAGNOSIS — R110 Nausea: Secondary | ICD-10-CM | POA: Insufficient documentation

## 2020-05-25 DIAGNOSIS — Z79899 Other long term (current) drug therapy: Secondary | ICD-10-CM | POA: Insufficient documentation

## 2020-05-25 DIAGNOSIS — R109 Unspecified abdominal pain: Secondary | ICD-10-CM | POA: Insufficient documentation

## 2020-05-25 DIAGNOSIS — O26899 Other specified pregnancy related conditions, unspecified trimester: Secondary | ICD-10-CM | POA: Insufficient documentation

## 2020-05-25 DIAGNOSIS — O009 Unspecified ectopic pregnancy without intrauterine pregnancy: Secondary | ICD-10-CM | POA: Insufficient documentation

## 2020-05-25 DIAGNOSIS — O00102 Left tubal pregnancy without intrauterine pregnancy: Secondary | ICD-10-CM

## 2020-05-25 DIAGNOSIS — Z3A00 Weeks of gestation of pregnancy not specified: Secondary | ICD-10-CM | POA: Insufficient documentation

## 2020-05-26 ENCOUNTER — Encounter (HOSPITAL_COMMUNITY): Payer: Self-pay | Admitting: Obstetrics and Gynecology

## 2020-05-26 ENCOUNTER — Other Ambulatory Visit: Payer: Self-pay

## 2020-05-26 ENCOUNTER — Inpatient Hospital Stay (HOSPITAL_COMMUNITY): Payer: BC Managed Care – PPO

## 2020-05-26 DIAGNOSIS — Z3A00 Weeks of gestation of pregnancy not specified: Secondary | ICD-10-CM | POA: Diagnosis not present

## 2020-05-26 DIAGNOSIS — O009 Unspecified ectopic pregnancy without intrauterine pregnancy: Secondary | ICD-10-CM | POA: Diagnosis not present

## 2020-05-26 DIAGNOSIS — K6289 Other specified diseases of anus and rectum: Secondary | ICD-10-CM | POA: Diagnosis not present

## 2020-05-26 DIAGNOSIS — R109 Unspecified abdominal pain: Secondary | ICD-10-CM | POA: Diagnosis not present

## 2020-05-26 DIAGNOSIS — R110 Nausea: Secondary | ICD-10-CM | POA: Diagnosis not present

## 2020-05-26 DIAGNOSIS — Z87891 Personal history of nicotine dependence: Secondary | ICD-10-CM | POA: Diagnosis not present

## 2020-05-26 DIAGNOSIS — O00102 Left tubal pregnancy without intrauterine pregnancy: Secondary | ICD-10-CM

## 2020-05-26 DIAGNOSIS — Z79899 Other long term (current) drug therapy: Secondary | ICD-10-CM | POA: Diagnosis not present

## 2020-05-26 DIAGNOSIS — O26899 Other specified pregnancy related conditions, unspecified trimester: Secondary | ICD-10-CM | POA: Diagnosis present

## 2020-05-26 LAB — CBC
HCT: 34.2 % — ABNORMAL LOW (ref 36.0–46.0)
Hemoglobin: 11.5 g/dL — ABNORMAL LOW (ref 12.0–15.0)
MCH: 29.6 pg (ref 26.0–34.0)
MCHC: 33.6 g/dL (ref 30.0–36.0)
MCV: 87.9 fL (ref 80.0–100.0)
Platelets: 244 10*3/uL (ref 150–400)
RBC: 3.89 MIL/uL (ref 3.87–5.11)
RDW: 12.9 % (ref 11.5–15.5)
WBC: 5.4 10*3/uL (ref 4.0–10.5)
nRBC: 0 % (ref 0.0–0.2)

## 2020-05-26 LAB — HCG, QUANTITATIVE, PREGNANCY: hCG, Beta Chain, Quant, S: 1007 m[IU]/mL — ABNORMAL HIGH (ref <5)

## 2020-05-26 MED ORDER — OXYCODONE-ACETAMINOPHEN 5-325 MG PO TABS
2.0000 | ORAL_TABLET | Freq: Once | ORAL | Status: AC
  Administered 2020-05-26: 2 via ORAL
  Filled 2020-05-26: qty 2

## 2020-05-26 MED ORDER — ONDANSETRON 4 MG PO TBDP
4.0000 mg | ORAL_TABLET | Freq: Once | ORAL | Status: AC
  Administered 2020-05-26: 4 mg via ORAL
  Filled 2020-05-26: qty 1

## 2020-05-26 NOTE — MAU Note (Signed)
Patient reports to MAU for left sided abdominal pain, rectal pain.

## 2020-05-26 NOTE — Discharge Instructions (Signed)

## 2020-05-26 NOTE — MAU Provider Note (Signed)
History     CSN: [id number]  Arrival date and time: 05/25/20 2359   First Provider Initiated Contact with Patient 05/26/20 0031      Chief Complaint  Patient presents with  . Abdominal Pain   Veronica Moore is a 24 y.o. G3P1011 at Unknown who receives care at [facility].  She presents today for Abdominal Pain.  She states she had sexual intercourse tonight around 11pm and afterwards started having left side abdominal pain.  She states the pain is sharp intermittent abdominal pressure and constant sharp rectal pressure.  Patient states she stuck her finger in her rectum and "on the wall that was in between I felt a ball and when I touched it I felt the pressure again."  Patient questions if she has a cysts and states that she had a right ovarian cysts on her right side.  She states the pain has been improving particularly since she has been sitting with her legs up.  She denies pain or discomfort during sexual intercourse. Patient states she wasn't even "having real sex... just the tip." Patient reports that      OB History    Gravida  3   Para  1   Term  1   Preterm  0   AB  1   Living  1     SAB  1   TAB      Ectopic      Multiple  0   Live Births  1           Past Medical History:  Diagnosis Date  . ADHD (attention deficit hyperactivity disorder)   . Alcohol abuse, in remission   . Allergy   . 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 Bipolar 1 disorder, depressed (HCC)   . Bipolar depression (HCC)   . Bipolar disorder (HCC)   . IUD (intrauterine device) in place 07/01/2017   Paragard placed 05/05/17  . IUD (intrauterine device) in place 07/01/2017   Paragard IUD  . PTSD (post-traumatic stress disorder)   . PTSD (post-traumatic stress disorder)   . Suicide threat or attempt    history of SI and history of attempt 2015    Past Surgical History:  Procedure Laterality Date  . NO PAST SURGERIES      Family History  Problem Relation Age of Onset   . ADD / ADHD Mother   . Alcohol abuse Mother   . Anxiety disorder Mother   . Bipolar disorder Mother   . Drug abuse Mother   . Seizures Mother   . Sexual abuse Mother   . ADD / ADHD Father   . Alcohol abuse Father   . Anxiety disorder Father   . Bipolar disorder Father   . Depression Father   . Drug abuse Father   . ADD / ADHD Sister   . ADD / ADHD Brother   . Dementia Maternal Grandmother   . Seizures Cousin   . Schizophrenia Cousin   . ADD / ADHD Sister     Social History   Tobacco Use  . Smoking status: Former Smoker    Packs/day: 1.00    Years: 8.00    Pack years: 8.00    Types: Cigarettes    Quit date: 04/24/2019    Years since quitting: 1.0  . Smokeless tobacco: Never Used  . Tobacco comment: Now smoking 1-2 a day.  Substance Use Topics  . Alcohol use: Not Currently    Comment: ocassionally  .  Drug use: Not Currently    Types: Marijuana    Comment: none with pregnancy    Allergies:  Allergies  Allergen Reactions  . Red Dye Anaphylaxis and Other (See Comments)    Pt is allergic to red dye 40.   . Latex Itching  . Pineapple Swelling and Other (See Comments)    Reaction:  Mouth/lip swelling     Medications Prior to Admission  Medication Sig Dispense Refill Last Dose  . lithium carbonate 150 MG capsule Take 30 mg by mouth 3 (three) times daily with meals.     . metroNIDAZOLE (FLAGYL) 500 MG tablet Take 1 tablet (500 mg total) by mouth 2 (two) times daily. 14 tablet 0     Review of Systems  Gastrointestinal: Positive for abdominal pain (Left Side) and nausea. Negative for vomiting.  Genitourinary: Positive for vaginal bleeding (Small amt prior d/t ectopic). Negative for difficulty urinating and vaginal discharge.   Physical Exam   Blood pressure 131/67, pulse 89, temperature 98.1 F (36.7 C), temperature source Oral, resp. rate 18, SpO2 100 %.  Physical Exam Vitals and nursing note reviewed. Exam conducted with a chaperone present.   Constitutional:      General: She is in acute distress (MIld).     Appearance: She is well-developed.  HENT:     Head: Normocephalic and atraumatic.  Eyes:     Conjunctiva/sclera: Conjunctivae normal.  Cardiovascular:     Rate and Rhythm: Regular rhythm.     Heart sounds: Normal heart sounds.  Pulmonary:     Effort: Pulmonary effort is normal. No respiratory distress.     Breath sounds: Normal breath sounds.  Abdominal:     General: Abdomen is flat. Bowel sounds are normal. There is no distension.     Palpations: Abdomen is soft.     Tenderness: There is abdominal tenderness in the left lower quadrant.  Genitourinary:    Vagina: Bleeding present.     Cervix: No cervical motion tenderness.     Comments: Bimanual Exam: Tenderness in Cul De Sac and in Left Adnexa Area. Blood noted on exam glove.  Musculoskeletal:     Cervical back: Normal range of motion.  Skin:    General: Skin is warm and dry.  Neurological:     Mental Status: She is alert and oriented to person, place, and time.  Psychiatric:        Mood and Affect: Mood normal.        Thought Content: Thought content normal.     MAU Course  Procedures Results for orders placed or performed during the hospital encounter of 05/25/20 (from the past 24 hour(s))  hCG, quantitative, pregnancy     Status: Abnormal   Collection Time: 05/26/20 12:38 AM  Result Value Ref Range   hCG, Beta Chain, Quant, S 1,007 (H) <5 mIU/mL  CBC     Status: Abnormal   Collection Time: 05/26/20 12:38 AM  Result Value Ref Range   WBC 5.4 4.0 - 10.5 K/uL   RBC 3.89 3.87 - 5.11 MIL/uL   Hemoglobin 11.5 (L) 12.0 - 15.0 g/dL   HCT 34.2 (L) 36 - 46 %   MCV 87.9 80.0 - 100.0 fL   MCH 29.6 26.0 - 34.0 pg   MCHC 33.6 30.0 - 36.0 g/dL   RDW 12.9 11.5 - 15.5 %   Platelets 244 150 - 400 K/uL   nRBC 0.0 0.0 - 0.2 %   US OB Transvaginal  Result Date: 05/26/2020 CLINICAL  DATA:  Previously documented ectopic pregnancy, treated with methotrexate x2,  continued pain and pressure EXAM: TRANSVAGINAL OB ULTRASOUND TECHNIQUE: Transvaginal ultrasound was performed for complete evaluation of the gestation as well as the maternal uterus, adnexal regions, and pelvic cul-de-sac. COMPARISON:  04/30/2020 FINDINGS: Intrauterine gestational sac: None Maternal uterus/adnexae: Uterus is anteverted.  No uterine masses. Right ovary measures 2.8 x 1.8 x 2.0 cm and is unremarkable. The left ovary measures 2.7 x 2.0 x 1.4 cm. The previously documented ectopic pregnancy within the left adnexa is again seen, gestational sac measuring approximately 1.5 x 1.3 x 1.1 cm. The yolk sac seen previously is no longer identified. A fetal pole cannot be visualized. There is a small amount of free fluid within the cul-de-sac and surrounding the left adnexa, increased since prior study. Fluid appears simple without signs of hemorrhage. IMPRESSION: 1. Persistent ectopic pregnancy within the left adnexa, with gestational sac identified as above. The previously seen yolk sac is no longer visualized. 2. No evidence of intrauterine pregnancy. 3. Pelvic free fluid, increased since prior study. No evidence of hemorrhage. Electronically Signed   By: Sharlet Salina M.D.   On: 05/26/2020 01:56    MDM Labs: CBC, hCG Ultrasound Nausea Pain Medication  Assessment and Plan  24 year old, G3P1011  Known Ectopic Pregnancy  S/p MTX Dose x 2 Abdominal Pain Rectal Pain Nausea  -Reviewed POC with patient. -Exam performed and findings discussed.  -Will give percocet 2 tablets for pain. -Zofran 4mg  ODT -Labs ordered. -Will send for and await results.   [place] 05/26/2020, 12:31 AM   Reassessment (2:25 AM)  -05/28/2020 results return as above.  -Dr. [place] consulted and informed of patient status and results. Advised: *Patient follow up as scheduled for repeat quant on 28th. *Discontinue sexual activity -Provider to bedside to discuss results and assess pain. -Patient reports  improvement in pain with percocet dosing. -Patient informed of MD recommendations. -Patient questions if she should have not engaged in sexual activity and reassured that she attempted and knows now that she should abstain a little longer. -Provider recommend she abstain until pregnancy hormone back to non-pregnant state. -Patient reports some continued nausea, but states she is managing fine.  -Encouraged to call or return to MAU if symptoms worsen or with the onset of new symptoms. -Discharged to home in stable condition.  29 MSN, CNM Advanced Practice Provider, [facility]

## 2020-05-27 ENCOUNTER — Other Ambulatory Visit (INDEPENDENT_AMBULATORY_CARE_PROVIDER_SITE_OTHER): Payer: BC Managed Care – PPO | Admitting: *Deleted

## 2020-05-27 DIAGNOSIS — O00202 Left ovarian pregnancy without intrauterine pregnancy: Secondary | ICD-10-CM

## 2020-05-27 NOTE — Progress Notes (Signed)
   Ms. Veronica Moore presents to [facility] for follow-up quant hCG blood draw today. She was seen in MAU for abdominal pain on 04/23/20. Patient denies abdominal pain or bleeding today. Discussed with patient, we are following hCG levels today. Valid contact number for patient confirmed. I will call the patient with results.     Clovis Pu 05/27/2020 8:48 AM

## 2020-05-28 LAB — BETA HCG QUANT (REF LAB): hCG Quant: 722 m[IU]/mL

## 2020-06-04 ENCOUNTER — Other Ambulatory Visit (INDEPENDENT_AMBULATORY_CARE_PROVIDER_SITE_OTHER): Payer: BC Managed Care – PPO | Admitting: *Deleted

## 2020-06-04 ENCOUNTER — Other Ambulatory Visit: Payer: Self-pay

## 2020-06-04 DIAGNOSIS — O00102 Left tubal pregnancy without intrauterine pregnancy: Secondary | ICD-10-CM

## 2020-06-04 NOTE — Progress Notes (Signed)
   Patient in clinic for repeat Beta Hcg. Will call with results.  Clovis Pu, RN

## 2020-06-05 LAB — BETA HCG QUANT (REF LAB): hCG Quant: 93 m[IU]/mL

## 2020-06-05 NOTE — Telephone Encounter (Signed)
-----   Message from Raelyn Mora, [place] sent at 06/05/2020  9:34 AM EDT ----- Appropriate drop in HCG level. Continue weekly HCG

## 2020-06-10 ENCOUNTER — Other Ambulatory Visit (INDEPENDENT_AMBULATORY_CARE_PROVIDER_SITE_OTHER): Payer: Medicaid Other | Admitting: *Deleted

## 2020-06-10 ENCOUNTER — Other Ambulatory Visit: Payer: Self-pay

## 2020-06-10 DIAGNOSIS — O00102 Left tubal pregnancy without intrauterine pregnancy: Secondary | ICD-10-CM

## 2020-06-10 NOTE — Progress Notes (Signed)
   Patient in clinic for beta hcg follow up.  Clovis Pu, RN

## 2020-06-11 ENCOUNTER — Other Ambulatory Visit: Payer: BC Managed Care – PPO

## 2020-06-11 LAB — BETA HCG QUANT (REF LAB): hCG Quant: 37 m[IU]/mL

## 2020-06-17 ENCOUNTER — Other Ambulatory Visit: Payer: Self-pay

## 2020-06-17 ENCOUNTER — Other Ambulatory Visit (INDEPENDENT_AMBULATORY_CARE_PROVIDER_SITE_OTHER): Payer: Medicaid Other | Admitting: *Deleted

## 2020-06-17 DIAGNOSIS — O00102 Left tubal pregnancy without intrauterine pregnancy: Secondary | ICD-10-CM

## 2020-06-17 NOTE — Progress Notes (Signed)
   Patient in clinic for repeat beta Hcg.  Launa Goedken L, RN  

## 2020-06-18 ENCOUNTER — Other Ambulatory Visit: Payer: BC Managed Care – PPO

## 2020-06-18 LAB — BETA HCG QUANT (REF LAB): hCG Quant: 24 m[IU]/mL

## 2020-06-19 ENCOUNTER — Telehealth: Payer: Self-pay | Admitting: *Deleted

## 2020-06-19 DIAGNOSIS — O00102 Left tubal pregnancy without intrauterine pregnancy: Secondary | ICD-10-CM

## 2020-06-19 NOTE — Telephone Encounter (Signed)
-----   Message from Raelyn Mora, [place] sent at 06/19/2020  6:16 AM EDT ----- Continue weekly HCG draws

## 2020-06-24 ENCOUNTER — Other Ambulatory Visit (INDEPENDENT_AMBULATORY_CARE_PROVIDER_SITE_OTHER): Payer: Medicaid Other | Admitting: *Deleted

## 2020-06-24 ENCOUNTER — Other Ambulatory Visit: Payer: Self-pay

## 2020-06-24 DIAGNOSIS — O00102 Left tubal pregnancy without intrauterine pregnancy: Secondary | ICD-10-CM

## 2020-06-24 NOTE — Progress Notes (Signed)
   Patient in clinic for beta hcg lab follow up.  Clovis Pu, RN

## 2020-06-25 ENCOUNTER — Telehealth: Payer: Self-pay | Admitting: *Deleted

## 2020-06-25 ENCOUNTER — Other Ambulatory Visit: Payer: BC Managed Care – PPO

## 2020-06-25 DIAGNOSIS — O00202 Left ovarian pregnancy without intrauterine pregnancy: Secondary | ICD-10-CM

## 2020-06-25 LAB — BETA HCG QUANT (REF LAB): hCG Quant: 16 m[IU]/mL

## 2020-06-25 NOTE — Telephone Encounter (Signed)
-----   Message from Raelyn Mora, [place] sent at 06/25/2020  6:31 AM EDT ----- Continue with weekly HCGs

## 2020-07-01 ENCOUNTER — Other Ambulatory Visit (INDEPENDENT_AMBULATORY_CARE_PROVIDER_SITE_OTHER): Payer: Medicaid Other | Admitting: *Deleted

## 2020-07-01 ENCOUNTER — Other Ambulatory Visit: Payer: Self-pay

## 2020-07-01 DIAGNOSIS — O00202 Left ovarian pregnancy without intrauterine pregnancy: Secondary | ICD-10-CM

## 2020-07-01 NOTE — Progress Notes (Signed)
   Patient in clinic for repeat Beta Hcg.  Clovis Pu, RN

## 2020-07-02 ENCOUNTER — Telehealth: Payer: Self-pay | Admitting: *Deleted

## 2020-07-02 DIAGNOSIS — O00102 Left tubal pregnancy without intrauterine pregnancy: Secondary | ICD-10-CM

## 2020-07-02 LAB — BETA HCG QUANT (REF LAB): hCG Quant: 10 m[IU]/mL

## 2020-07-02 NOTE — Telephone Encounter (Signed)
-----   Message from Raelyn Mora, [place] sent at 07/02/2020  1:30 PM EDT ----- Repeat HCG next week

## 2020-07-08 ENCOUNTER — Other Ambulatory Visit: Payer: Self-pay

## 2020-07-08 ENCOUNTER — Other Ambulatory Visit (INDEPENDENT_AMBULATORY_CARE_PROVIDER_SITE_OTHER): Payer: Medicaid Other | Admitting: *Deleted

## 2020-07-08 DIAGNOSIS — O00102 Left tubal pregnancy without intrauterine pregnancy: Secondary | ICD-10-CM

## 2020-07-08 NOTE — Progress Notes (Signed)
   Patient in clinic for repeat beta hcg.   Martin, Tamika L, RN  

## 2020-07-09 ENCOUNTER — Telehealth: Payer: Self-pay | Admitting: *Deleted

## 2020-07-09 LAB — BETA HCG QUANT (REF LAB): hCG Quant: 6 m[IU]/mL

## 2020-07-09 NOTE — Telephone Encounter (Signed)
-----   Message from [place], [place] sent at 07/09/2020 12:35 PM EDT ----- Regarding: FW: Please have this patient return next week for (hopefully) her last HCG level). ----- Message ----- From: Nell Range Lab Results In Sent: 07/09/2020   5:39 AM EDT To: Raelyn Mora, CNM

## 2020-07-15 ENCOUNTER — Other Ambulatory Visit: Payer: Self-pay

## 2020-07-15 ENCOUNTER — Other Ambulatory Visit (INDEPENDENT_AMBULATORY_CARE_PROVIDER_SITE_OTHER): Payer: Medicaid Other | Admitting: *Deleted

## 2020-07-15 DIAGNOSIS — R109 Unspecified abdominal pain: Secondary | ICD-10-CM

## 2020-07-15 DIAGNOSIS — O00202 Left ovarian pregnancy without intrauterine pregnancy: Secondary | ICD-10-CM

## 2020-07-15 NOTE — Progress Notes (Signed)
   Patient in clinic for repeat beta Hcg.  Clovis Pu, RN

## 2020-07-16 ENCOUNTER — Telehealth: Payer: Self-pay | Admitting: *Deleted

## 2020-07-16 DIAGNOSIS — O00202 Left ovarian pregnancy without intrauterine pregnancy: Secondary | ICD-10-CM

## 2020-07-16 LAB — BETA HCG QUANT (REF LAB): hCG Quant: 4 m[IU]/mL

## 2020-07-16 NOTE — Addendum Note (Signed)
Addended by: Clovis Pu on: 07/16/2020 08:39 AM   Modules accepted: Orders

## 2020-07-16 NOTE — Telephone Encounter (Signed)
-----   Message from Raelyn Mora, [place] sent at 07/16/2020  8:31 AM EDT ----- Repeat HCG next week

## 2020-07-22 ENCOUNTER — Other Ambulatory Visit (INDEPENDENT_AMBULATORY_CARE_PROVIDER_SITE_OTHER): Payer: Medicaid Other | Admitting: *Deleted

## 2020-07-22 ENCOUNTER — Other Ambulatory Visit: Payer: Self-pay

## 2020-07-22 DIAGNOSIS — O00202 Left ovarian pregnancy without intrauterine pregnancy: Secondary | ICD-10-CM

## 2020-07-22 NOTE — Progress Notes (Signed)
   Patient in clinic for repeat beta hcg.   Rhetta Cleek L, RN  

## 2020-07-23 LAB — BETA HCG QUANT (REF LAB): hCG Quant: 2 m[IU]/mL

## 2020-07-31 ENCOUNTER — Ambulatory Visit (INDEPENDENT_AMBULATORY_CARE_PROVIDER_SITE_OTHER): Payer: BC Managed Care – PPO | Admitting: Obstetrics and Gynecology

## 2020-07-31 ENCOUNTER — Other Ambulatory Visit: Payer: Self-pay

## 2020-07-31 ENCOUNTER — Encounter: Payer: Self-pay | Admitting: Obstetrics and Gynecology

## 2020-07-31 VITALS — BP 113/74 | HR 65 | Temp 98.7°F | Ht 60.0 in | Wt 134.2 lb

## 2020-07-31 DIAGNOSIS — O00102 Left tubal pregnancy without intrauterine pregnancy: Secondary | ICD-10-CM | POA: Diagnosis not present

## 2020-07-31 NOTE — Patient Instructions (Signed)
Please wait 3 months of normal menstrual periods to try to conceive.

## 2020-07-31 NOTE — Progress Notes (Signed)
  GYNECOLOGY PROGRESS NOTE  History:  Ms. Kallee Nam is a 24 y.o. [id number] presents to [facility] office today for follow-up visit after ectopic pregnancy and MTX treatment x 2. She reports she has had regular menstrual cycles. She does not desire birth control. She and her partner desire to TTC again. She would also like to receive the COVID-19 vaccine. She denies h/a, dizziness, shortness of breath, n/v, or fever/chills.    The following portions of the patient's history were reviewed and updated as appropriate: allergies, current medications, past family history, past medical history, past social history, past surgical history and problem list.   Review of Systems:  Pertinent items are noted in HPI.   Objective:  Physical Exam Blood pressure 113/74, pulse 65, temperature 98.7 F (37.1 C), temperature source Oral, height 5' (1.524 m), weight 134 lb 3.2 oz (60.9 kg), not currently breastfeeding. VS reviewed, nursing note reviewed,  Constitutional: well developed, well nourished, no distress HEENT: normocephalic CV: normal rate Pulm/chest wall: normal effort Breast Exam: deferred Abdomen: soft Neuro: alert and oriented x 3 Skin: warm, dry Psych: affect normal Pelvic exam: Cervix pink, visually closed, without lesion, scant white creamy discharge, vaginal walls and external genitalia normal Bimanual exam: Cervix 0/long/high, firm, anterior, neg CMT, uterus nontender, nonenlarged, adnexa without tenderness, enlargement, or mass  Assessment & Plan:  Left tubal pregnancy without intrauterine pregnancy - Advised that HCG levels have finally reached zero - Recommend not TTC until after she has had 3 NORMAL menstrual periods - Offered 1 dose of Depo-Provera before TTC -- patient declined - Advised to use condoms with every sexual encounter -- condoms given - Advised to call with (+) HPT, so we can order necessary labs and viability U/S when appropriate - Patient verbalized an  understanding of the plan of care and agrees.    Total time spent discussing ectopic pregnancy process, TTC and possible BC options was 15 minutes. There was 5 minutes of chart review time spent prior to this encounter. Total time spent = 20 minutes.  Raelyn Mora, CNM 9:27 AM

## 2020-11-19 ENCOUNTER — Other Ambulatory Visit: Payer: Self-pay | Admitting: Obstetrics and Gynecology

## 2020-11-19 DIAGNOSIS — B9689 Other specified bacterial agents as the cause of diseases classified elsewhere: Secondary | ICD-10-CM

## 2020-11-19 DIAGNOSIS — L292 Pruritus vulvae: Secondary | ICD-10-CM

## 2020-11-19 MED ORDER — NYSTATIN 100000 UNIT/GM EX CREA: 1.0000 "application " | TOPICAL_CREAM | Freq: Two times a day (BID) | CUTANEOUS | 0 refills | Status: DC

## 2020-11-19 MED ORDER — METRONIDAZOLE 500 MG PO TABS: 500.0000 mg | ORAL_TABLET | Freq: Two times a day (BID) | ORAL | 0 refills | Status: DC

## 2020-11-19 NOTE — Progress Notes (Signed)
Rx sent per pt request 

## 2020-12-03 ENCOUNTER — Ambulatory Visit: Payer: Medicaid Other

## 2020-12-03 ENCOUNTER — Telehealth: Payer: Self-pay

## 2020-12-03 NOTE — Telephone Encounter (Signed)
Called patient to reschedule appt today and patient answered and we got appt rescheduled.  Veronica Moore

## 2020-12-10 ENCOUNTER — Ambulatory Visit (INDEPENDENT_AMBULATORY_CARE_PROVIDER_SITE_OTHER): Payer: Medicaid Other | Admitting: *Deleted

## 2020-12-10 ENCOUNTER — Other Ambulatory Visit: Payer: Self-pay

## 2020-12-10 VITALS — BP 120/76 | HR 97 | Temp 98.5°F | Ht 60.0 in | Wt 136.2 lb

## 2020-12-10 DIAGNOSIS — O0990 Supervision of high risk pregnancy, unspecified, unspecified trimester: Secondary | ICD-10-CM | POA: Insufficient documentation

## 2020-12-10 DIAGNOSIS — Z3200 Encounter for pregnancy test, result unknown: Secondary | ICD-10-CM

## 2020-12-10 DIAGNOSIS — O0991 Supervision of high risk pregnancy, unspecified, first trimester: Secondary | ICD-10-CM

## 2020-12-10 DIAGNOSIS — Z3201 Encounter for pregnancy test, result positive: Secondary | ICD-10-CM

## 2020-12-10 DIAGNOSIS — Z3A01 Less than 8 weeks gestation of pregnancy: Secondary | ICD-10-CM

## 2020-12-10 LAB — POCT URINE PREGNANCY: Preg Test, Ur: POSITIVE — AB

## 2020-12-10 NOTE — Progress Notes (Signed)
   PRENATAL INTAKE SUMMARY  Ms. Ritacco presents today New OB Nurse Interview.  OB History    Gravida  4   Para  1   Term  1   Preterm  0   AB  2   Living  1     SAB  2   IAB      Ectopic      Multiple  0   Live Births  1          I have reviewed the patient's medical, obstetrical, social, and family histories, medications, and available lab results.  SUBJECTIVE She has no unusual complaints  OBJECTIVE Initial nurse interview for history (New OB)  EDD: 08/04/2021 by LMP GA: [redacted]w[redacted]d G4P1021  GENERAL APPEARANCE: alert, well appearing, in no apparent distress, oriented to person, place and time   ASSESSMENT Positive UPT Normal pregnancy  PLAN Prenatal care: [facility] Beta Hcg lab completed Ultrasound <14 weeks ordered to confirm dating/viability 12/17/20 Labs to completed at next visit with Raelyn Mora, CNM 01/09/21 Continue PNV  Clovis Pu, RN

## 2020-12-11 LAB — BETA HCG QUANT (REF LAB): hCG Quant: 21077 m[IU]/mL

## 2020-12-17 ENCOUNTER — Ambulatory Visit: Payer: Medicaid Other

## 2020-12-18 ENCOUNTER — Other Ambulatory Visit: Payer: Self-pay

## 2020-12-18 ENCOUNTER — Ambulatory Visit
Admission: RE | Admit: 2020-12-18 | Discharge: 2020-12-18 | Disposition: A | Discharge status: Home / Self Care | Payer: Medicaid Other | Source: Ambulatory Visit | Attending: Obstetrics and Gynecology | Admitting: Obstetrics and Gynecology

## 2020-12-18 DIAGNOSIS — O0990 Supervision of high risk pregnancy, unspecified, unspecified trimester: Secondary | ICD-10-CM

## 2020-12-31 ENCOUNTER — Encounter: Payer: Self-pay | Admitting: *Deleted

## 2021-01-02 ENCOUNTER — Ambulatory Visit (INDEPENDENT_AMBULATORY_CARE_PROVIDER_SITE_OTHER): Payer: Medicaid Other | Admitting: Obstetrics and Gynecology

## 2021-01-02 ENCOUNTER — Other Ambulatory Visit: Payer: Self-pay

## 2021-01-02 ENCOUNTER — Encounter (HOSPITAL_BASED_OUTPATIENT_CLINIC_OR_DEPARTMENT_OTHER): Payer: Self-pay | Admitting: *Deleted

## 2021-01-02 ENCOUNTER — Other Ambulatory Visit (HOSPITAL_COMMUNITY)
Admission: RE | Admit: 2021-01-02 | Discharge: 2021-01-02 | Disposition: A | Discharge status: Home / Self Care | Payer: Medicaid Other | Source: Ambulatory Visit | Attending: Obstetrics and Gynecology | Admitting: Obstetrics and Gynecology

## 2021-01-02 VITALS — BP 115/75 | HR 74 | Temp 99.1°F | Wt 130.2 lb

## 2021-01-02 DIAGNOSIS — O219 Vomiting of pregnancy, unspecified: Secondary | ICD-10-CM | POA: Diagnosis not present

## 2021-01-02 DIAGNOSIS — Z87891 Personal history of nicotine dependence: Secondary | ICD-10-CM | POA: Diagnosis not present

## 2021-01-02 DIAGNOSIS — Z91040 Latex allergy status: Secondary | ICD-10-CM | POA: Insufficient documentation

## 2021-01-02 DIAGNOSIS — O0990 Supervision of high risk pregnancy, unspecified, unspecified trimester: Secondary | ICD-10-CM | POA: Diagnosis not present

## 2021-01-02 DIAGNOSIS — Z3A09 9 weeks gestation of pregnancy: Secondary | ICD-10-CM | POA: Diagnosis not present

## 2021-01-02 DIAGNOSIS — O0910 Supervision of pregnancy with history of ectopic or molar pregnancy, unspecified trimester: Secondary | ICD-10-CM

## 2021-01-02 DIAGNOSIS — O210 Mild hyperemesis gravidarum: Secondary | ICD-10-CM | POA: Diagnosis not present

## 2021-01-02 MED ORDER — BLOOD PRESSURE MONITOR AUTOMAT DEVI: 1.0000 | Freq: Every day | 0 refills | Status: DC

## 2021-01-02 MED ORDER — PANTOPRAZOLE SODIUM 40 MG PO TBEC: 40.0000 mg | DELAYED_RELEASE_TABLET | Freq: Every day | ORAL | 6 refills | Status: DC

## 2021-01-02 MED ORDER — GOJJI WEIGHT SCALE MISC: 1.0000 | Freq: Every day | 0 refills | Status: DC | PRN

## 2021-01-02 MED ORDER — ONDANSETRON 4 MG PO TBDP
8.0000 mg | ORAL_TABLET | Freq: Once | ORAL | Status: AC
  Administered 2021-01-02: 8 mg via ORAL

## 2021-01-02 MED ORDER — ONDANSETRON 4 MG PO TBDP: 4.0000 mg | ORAL_TABLET | Freq: Three times a day (TID) | ORAL | 1 refills | Status: DC | PRN

## 2021-01-02 MED ORDER — METOCLOPRAMIDE HCL 10 MG PO TABS: 10.0000 mg | ORAL_TABLET | Freq: Four times a day (QID) | ORAL | 6 refills | Status: DC | PRN
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
## 2021-01-02 NOTE — Patient Instructions (Signed)
Morning Sickness  Morning sickness is when a woman feels nauseous during pregnancy. This nauseous feeling may or may not come with vomiting. It often occurs in the morning, but it can be a problem at any time of day. Morning sickness is most common during the first trimester. In some cases, it may continue throughout pregnancy. Although morning sickness is unpleasant, it is usually harmless unless the woman develops severe and continual vomiting (hyperemesis gravidarum), a condition that requires more intense treatment. What are the causes? The exact cause of this condition is not known, but it seems to be related to normal hormonal changes that occur in pregnancy. What increases the risk? You are more likely to develop this condition if:  You experienced nausea or vomiting before your pregnancy.  You had morning sickness during a previous pregnancy.  You are pregnant with more than one baby, such as twins. What are the signs or symptoms? Symptoms of this condition include:  Nausea.  Vomiting. How is this diagnosed? This condition is usually diagnosed based on your signs and symptoms. How is this treated? In many cases, treatment is not needed for this condition. Making some changes to what you eat may help to control symptoms. Your health care provider may also prescribe or recommend:  Vitamin B6 supplements.  Anti-nausea medicines.  Ginger. Follow these instructions at home: Medicines  Take over-the-counter and prescription medicines only as told by your health care provider. Do not use any prescription, over-the-counter, or herbal medicines for morning sickness without first talking with your health care provider.  Take multivitamins before getting pregnant. This can prevent or decrease the severity of morning sickness in most women. Eating and drinking  Eat a piece of dry toast or crackers before getting out of bed in the morning.  Eat 5 or 6 small meals a day.  Eat dry  and bland foods, such as rice or a baked potato. Foods that are high in carbohydrates are often helpful.  Avoid greasy, fatty, and spicy foods.  Have someone cook for you if the smell of any food causes nausea and vomiting.  If you feel nauseous after taking prenatal vitamins, take the vitamins at night or with a snack.  Eat a protein snack between meals if you are hungry. Nuts, yogurt, and cheese are good options.  Drink fluids throughout the day.  Try ginger ale made with real ginger, ginger tea made from fresh grated ginger, or ginger candies. General instructions  Do not use any products that contain nicotine or tobacco. These products include cigarettes, chewing tobacco, and vaping devices, such as e-cigarettes. If you need help quitting, ask your health care provider.  Get an air purifier to keep the air in your house free of odors.  Get plenty of fresh air.  Try to avoid odors that trigger your nausea.  Consider trying these methods to help relieve symptoms: ? Wearing an acupressure wristband. These wristbands are often worn for seasickness. ? Acupuncture. Contact a health care provider if:  Your home remedies are not working and you need medicine.  You feel dizzy or light-headed.  You are losing weight. Get help right away if:  You have persistent and uncontrolled nausea and vomiting.  You faint.  You have severe pain in your abdomen. Summary  Morning sickness is when a woman feels nauseous during pregnancy. This nauseous feeling may or may not come with vomiting.  Morning sickness is most common during the first trimester.  It often occurs in the   morning, but it can be a problem at any time of day.  In many cases, treatment is not needed for this condition. Making some changes to what you eat may help to control symptoms. This information is not intended to replace advice given to you by your health care provider. Make sure you discuss any questions you have  with your health care provider. Document Revised: 07/01/2020 Document Reviewed: 06/10/2020 Elsevier Patient Education  2021 Elsevier Inc. Obstetrics: Normal and Problem Pregnancies (7th ed., pp. 102-121). [place], [place]: Elsevier."> Textbook of Family Medicine (9th ed., pp. [phone]). [place], [place]: Elsevier [employer].">  First Trimester of Pregnancy  The first trimester of pregnancy starts on the first day of your last menstrual period until the end of week 12. This is months 1 through 3 of pregnancy. A week after a sperm fertilizes an egg, the egg will implant into the wall of the uterus and begin to develop into a baby. By the end of 12 weeks, all the baby's organs will be formed and the baby will be 2-3 inches in size. Body changes during your first trimester Your body goes through many changes during pregnancy. The changes vary and generally return to normal after your baby is born. Physical changes  You may gain or lose weight.  Your breasts may begin to grow larger and become tender. The tissue that surrounds your nipples (areola) may become darker.  Dark spots or blotches (chloasma or mask of pregnancy) may develop on your face.  You may have changes in your hair. These can include thickening or thinning of your hair or changes in texture. Health changes  You may feel nauseous, and you may vomit.  You may have heartburn.  You may develop headaches.  You may develop constipation.  Your gums may bleed and may be sensitive to brushing and flossing. Other changes  You may tire easily.  You may urinate more often.  Your menstrual periods will stop.  You may have a loss of appetite.  You may develop cravings for certain kinds of food.  You may have changes in your emotions from day to day.  You may have more vivid and strange dreams. Follow these instructions at home: Medicines  Follow your health care provider's instructions regarding medicine use. Specific  medicines may be either safe or unsafe to take during pregnancy. Do not take any medicines unless told to by your health care provider.  Take a prenatal vitamin that contains at least 600 micrograms (mcg) of folic acid. Eating and drinking  Eat a healthy diet that includes fresh fruits and vegetables, whole grains, good sources of protein such as meat, eggs, or tofu, and low-fat dairy products.  Avoid raw meat and unpasteurized juice, milk, and cheese. These carry germs that can harm you and your baby.  If you feel nauseous or you vomit: ? Eat 4 or 5 small meals a day instead of 3 large meals. ? Try eating a few soda crackers. ? Drink liquids between meals instead of during meals.  You may need to take these actions to prevent or treat constipation: ? Drink enough fluid to keep your urine pale yellow. ? Eat foods that are high in fiber, such as beans, whole grains, and fresh fruits and vegetables. ? Limit foods that are high in fat and processed sugars, such as fried or sweet foods. Activity  Exercise only as directed by your health care provider. Most people can continue their usual exercise routine during pregnancy. Try to  exercise for 30 minutes at least 5 days a week.  Stop exercising if you develop pain or cramping in the lower abdomen or lower back.  Avoid exercising if it is very hot or humid or if you are at high altitude.  Avoid heavy lifting.  If you choose to, you may have sex unless your health care provider tells you not to. Relieving pain and discomfort  Wear a good support bra to relieve breast tenderness.  Rest with your legs elevated if you have leg cramps or low back pain.  If you develop bulging veins (varicose veins) in your legs: ? Wear support hose as told by your health care provider. ? Elevate your feet for 15 minutes, 3-4 times a day. ? Limit salt in your diet. Safety  Wear your seat belt at all times when driving or riding in a car.  Talk with your  health care provider if someone is verbally or physically abusive to you.  Talk with your health care provider if you are feeling sad or have thoughts of hurting yourself. Lifestyle  Do not use hot tubs, steam rooms, or saunas.  Do not douche. Do not use tampons or scented sanitary pads.  Do not use herbal remedies, alcohol, illegal drugs, or medicines that are not approved by your health care provider. Chemicals in these products can harm your baby.  Do not use any products that contain nicotine or tobacco, such as cigarettes, e-cigarettes, and chewing tobacco. If you need help quitting, ask your health care provider.  Avoid cat litter boxes and soil used by cats. These carry germs that can cause birth defects in the baby and possibly loss of the unborn baby (fetus) by miscarriage or stillbirth. General instructions  During routine prenatal visits in the first trimester, your health care provider will do a physical exam, perform necessary tests, and ask you how things are going. Keep all follow-up visits. This is important.  Ask for help if you have counseling or nutritional needs during pregnancy. Your health care provider can offer advice or refer you to specialists for help with various needs.  Schedule a dentist appointment. At home, brush your teeth with a soft toothbrush. Floss gently.  Write down your questions. Take them to your prenatal visits. Where to find more information  American Pregnancy Association: americanpregnancy.org  [employer]: https://www.todd-brady.net/  Office on [employer] Health: Might[employer].nz Contact a health care provider if you have:  Dizziness.  A fever.  Mild pelvic cramps, pelvic pressure, or nagging pain in the abdominal area.  Nausea, vomiting, or diarrhea that lasts for 24 hours or longer.  A bad-smelling vaginal discharge.  Pain when you urinate.  Known exposure to a  contagious illness, such as chickenpox, measles, Zika virus, HIV, or hepatitis. Get help right away if you have:  Spotting or bleeding from your vagina.  Severe abdominal cramping or pain.  Shortness of breath or chest pain.  Any kind of trauma, such as from a fall or a car crash.  New or increased pain, swelling, or redness in an arm or leg. Summary  The first trimester of pregnancy starts on the first day of your last menstrual period until the end of week 12 (months 1 through 3).  Eating 4 or 5 small meals a day rather than 3 large meals may help to relieve nausea and vomiting.  Do not use any products that contain nicotine or tobacco, such as cigarettes, e-cigarettes, and chewing tobacco. If  you need help quitting, ask your health care provider.  Keep all follow-up visits. This is important. This information is not intended to replace advice given to you by your health care provider. Make sure you discuss any questions you have with your health care provider. Document Revised: 04/24/2020 Document Reviewed: 02/29/2020 Elsevier Patient Education  2021 [employer].

## 2021-01-02 NOTE — ED Triage Notes (Signed)
She was seen by her OBGYN today. She is [redacted] weeks pregnant. Here tonight with vomiting. Hx of vomiting for the past 3 weeks.

## 2021-01-03 ENCOUNTER — Encounter: Payer: Self-pay | Admitting: Obstetrics and Gynecology

## 2021-01-03 ENCOUNTER — Emergency Department (HOSPITAL_BASED_OUTPATIENT_CLINIC_OR_DEPARTMENT_OTHER)
Admission: EM | Admit: 2021-01-03 | Discharge: 2021-01-03 | Disposition: A | Discharge status: Home / Self Care | Payer: Medicaid Other | Attending: Emergency Medicine | Admitting: Emergency Medicine

## 2021-01-03 ENCOUNTER — Encounter (HOSPITAL_BASED_OUTPATIENT_CLINIC_OR_DEPARTMENT_OTHER): Payer: Self-pay | Admitting: Emergency Medicine

## 2021-01-03 DIAGNOSIS — O219 Vomiting of pregnancy, unspecified: Secondary | ICD-10-CM

## 2021-01-03 LAB — CBC/D/PLT+RPR+RH+ABO+RUB AB...
Antibody Screen: NEGATIVE
Basophils Absolute: 0 10*3/uL (ref 0.0–0.2)
Basos: 0 %
EOS (ABSOLUTE): 0.1 10*3/uL (ref 0.0–0.4)
Eos: 1 %
HCV Ab: 0.1 s/co ratio (ref 0.0–0.9)
HIV Screen 4th Generation wRfx: NONREACTIVE
Hematocrit: 37.5 % (ref 34.0–46.6)
Hemoglobin: 13.2 g/dL (ref 11.1–15.9)
Hepatitis B Surface Ag: NEGATIVE
Immature Grans (Abs): 0 10*3/uL (ref 0.0–0.1)
Immature Granulocytes: 0 %
Lymphocytes Absolute: 1.2 10*3/uL (ref 0.7–3.1)
Lymphs: 19 %
MCH: 30.1 pg (ref 26.6–33.0)
MCHC: 35.2 g/dL (ref 31.5–35.7)
MCV: 85 fL (ref 79–97)
Monocytes Absolute: 0.5 10*3/uL (ref 0.1–0.9)
Monocytes: 9 %
Neutrophils Absolute: 4.4 10*3/uL (ref 1.4–7.0)
Neutrophils: 71 %
Platelets: 261 10*3/uL (ref 150–450)
RBC: 4.39 x10E6/uL (ref 3.77–5.28)
RDW: 12.7 % (ref 11.7–15.4)
RPR Ser Ql: NONREACTIVE
Rh Factor: POSITIVE
Rubella Antibodies, IGG: 21 index (ref >0.99)
WBC: 6.2 10*3/uL (ref 3.4–10.8)

## 2021-01-03 LAB — CBC WITH DIFFERENTIAL/PLATELET
Abs Immature Granulocytes: 0.01 10*3/uL (ref 0.00–0.07)
Basophils Absolute: 0 10*3/uL (ref 0.0–0.1)
Basophils Relative: 0 %
Eosinophils Absolute: 0 10*3/uL (ref 0.0–0.5)
Eosinophils Relative: 1 %
HCT: 36.4 % (ref 36.0–46.0)
Hemoglobin: 12.9 g/dL (ref 12.0–15.0)
Immature Granulocytes: 0 %
Lymphocytes Relative: 28 %
Lymphs Abs: 1.6 10*3/uL (ref 0.7–4.0)
MCH: 29.9 pg (ref 26.0–34.0)
MCHC: 35.4 g/dL (ref 30.0–36.0)
MCV: 84.5 fL (ref 80.0–100.0)
Monocytes Absolute: 0.4 10*3/uL (ref 0.1–1.0)
Monocytes Relative: 8 %
Neutro Abs: 3.7 10*3/uL (ref 1.7–7.7)
Neutrophils Relative %: 63 %
Platelets: 268 10*3/uL (ref 150–400)
RBC: 4.31 MIL/uL (ref 3.87–5.11)
RDW: 12.4 % (ref 11.5–15.5)
WBC: 5.8 10*3/uL (ref 4.0–10.5)
nRBC: 0 % (ref 0.0–0.2)

## 2021-01-03 LAB — URINALYSIS, ROUTINE W REFLEX MICROSCOPIC
Bilirubin Urine: NEGATIVE
Glucose, UA: NEGATIVE mg/dL
Hgb urine dipstick: NEGATIVE
Ketones, ur: >=80 mg/dL — AB
Leukocytes,Ua: NEGATIVE
Nitrite: NEGATIVE
Protein, ur: NEGATIVE mg/dL
Specific Gravity, Urine: 1.03 (ref 1.005–1.030)
pH: 6 (ref 5.0–8.0)

## 2021-01-03 LAB — CERVICOVAGINAL ANCILLARY ONLY
Bacterial Vaginitis (gardnerella): NEGATIVE
Candida Glabrata: NEGATIVE
Candida Vaginitis: POSITIVE — AB
Chlamydia: NEGATIVE
Comment: NEGATIVE
Comment: NEGATIVE
Comment: NEGATIVE
Comment: NEGATIVE
Comment: NEGATIVE
Comment: NORMAL
Neisseria Gonorrhea: NEGATIVE
Trichomonas: NEGATIVE

## 2021-01-03 LAB — HCV INTERPRETATION

## 2021-01-03 LAB — BASIC METABOLIC PANEL
Anion gap: 13 (ref 5–15)
BUN: 10 mg/dL (ref 6–20)
CO2: 19 mmol/L — ABNORMAL LOW (ref 22–32)
Calcium: 9.2 mg/dL (ref 8.9–10.3)
Chloride: 102 mmol/L (ref 98–111)
Creatinine, Ser: 0.61 mg/dL (ref 0.44–1.00)
GFR, Estimated: >60 mL/min (ref >60)
Glucose, Bld: 82 mg/dL (ref 70–99)
Potassium: 3.3 mmol/L — ABNORMAL LOW (ref 3.5–5.1)
Sodium: 134 mmol/L — ABNORMAL LOW (ref 135–145)

## 2021-01-03 MED ORDER — ONDANSETRON HCL 4 MG/2ML IJ SOLN
4.0000 mg | Freq: Once | INTRAMUSCULAR | Status: AC
  Administered 2021-01-03: 4 mg via INTRAVENOUS
  Filled 2021-01-03: qty 2

## 2021-01-03 MED ORDER — SODIUM CHLORIDE 0.9 % IV BOLUS
500.0000 mL | Freq: Once | INTRAVENOUS | Status: AC
  Administered 2021-01-03: 500 mL via INTRAVENOUS

## 2021-01-03 NOTE — ED Notes (Signed)
Pt ambulatory to restroom with steady gait, GCS 15, NAD noted. Pt able to obtain urine sample at this time.
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
## 2021-01-03 NOTE — ED Notes (Signed)
Pt given ice chips at this time. Provider stated she wanted to try a PO challenge on pt.

## 2021-01-04 LAB — URINE CULTURE, OB REFLEX

## 2021-01-04 LAB — CULTURE, OB URINE

## 2021-01-06 ENCOUNTER — Telehealth: Payer: Self-pay | Admitting: *Deleted

## 2021-01-06 ENCOUNTER — Encounter: Payer: Self-pay | Admitting: Obstetrics and Gynecology

## 2021-01-06 DIAGNOSIS — B373 Candidiasis of vulva and vagina: Secondary | ICD-10-CM

## 2021-01-06 DIAGNOSIS — B3731 Acute candidiasis of vulva and vagina: Secondary | ICD-10-CM

## 2021-01-06 MED ORDER — TERCONAZOLE 0.4 % VA CREA: 1.0000 | TOPICAL_CREAM | Freq: Every day | VAGINAL | 0 refills | Status: DC

## 2021-01-06 NOTE — Telephone Encounter (Signed)
-----   Message from [place], [place] sent at 01/03/2021  7:17 PM EST ----- Treat for yeast - cream only since N/V persisting

## 2021-01-07 ENCOUNTER — Telehealth: Payer: Self-pay | Admitting: *Deleted

## 2021-01-07 ENCOUNTER — Encounter (HOSPITAL_COMMUNITY): Payer: Self-pay | Admitting: Family Medicine

## 2021-01-07 ENCOUNTER — Inpatient Hospital Stay (HOSPITAL_COMMUNITY)
Admission: AD | Admit: 2021-01-07 | Discharge: 2021-01-07 | Disposition: A | Discharge status: Home / Self Care | Payer: Medicaid Other | Attending: Family Medicine | Admitting: Family Medicine

## 2021-01-07 ENCOUNTER — Other Ambulatory Visit: Payer: Self-pay

## 2021-01-07 DIAGNOSIS — R42 Dizziness and giddiness: Secondary | ICD-10-CM | POA: Diagnosis not present

## 2021-01-07 DIAGNOSIS — R519 Headache, unspecified: Secondary | ICD-10-CM | POA: Diagnosis not present

## 2021-01-07 DIAGNOSIS — Z87891 Personal history of nicotine dependence: Secondary | ICD-10-CM | POA: Insufficient documentation

## 2021-01-07 DIAGNOSIS — Z79899 Other long term (current) drug therapy: Secondary | ICD-10-CM | POA: Diagnosis not present

## 2021-01-07 DIAGNOSIS — O26891 Other specified pregnancy related conditions, first trimester: Secondary | ICD-10-CM | POA: Insufficient documentation

## 2021-01-07 DIAGNOSIS — R55 Syncope and collapse: Secondary | ICD-10-CM | POA: Insufficient documentation

## 2021-01-07 DIAGNOSIS — W07XXXA Fall from chair, initial encounter: Secondary | ICD-10-CM | POA: Diagnosis not present

## 2021-01-07 DIAGNOSIS — Z3A10 10 weeks gestation of pregnancy: Secondary | ICD-10-CM | POA: Diagnosis not present

## 2021-01-07 DIAGNOSIS — O0990 Supervision of high risk pregnancy, unspecified, unspecified trimester: Secondary | ICD-10-CM

## 2021-01-07 DIAGNOSIS — Z91040 Latex allergy status: Secondary | ICD-10-CM | POA: Insufficient documentation

## 2021-01-07 LAB — URINALYSIS, ROUTINE W REFLEX MICROSCOPIC
Bilirubin Urine: NEGATIVE
Glucose, UA: NEGATIVE mg/dL
Hgb urine dipstick: NEGATIVE
Ketones, ur: NEGATIVE mg/dL
Leukocytes,Ua: NEGATIVE
Nitrite: NEGATIVE
Protein, ur: NEGATIVE mg/dL
Specific Gravity, Urine: 1.005 (ref 1.005–1.030)
pH: 6 (ref 5.0–8.0)

## 2021-01-07 LAB — GLUCOSE, CAPILLARY: Glucose-Capillary: 109 mg/dL — ABNORMAL HIGH (ref 70–99)

## 2021-01-07 NOTE — Telephone Encounter (Signed)
Patient called reporting dizziness, blackout/vision change and headache about  30 minutes prior to calling clinic. She also reported vomiting this morning. Severe headache x 2 days, however today is not as bad but located back of her head. She took a Zofran this AM and she took her acid reflux medication. Patient stated she had a stroke in the past and she felt the same way. Advise patient she needed to be seen at [facility] and have someone drive her. Spoke with Raelyn Mora, CNM at [facility] to make aware that patient will be arriving soon.  Clovis Pu, RN

## 2021-01-07 NOTE — MAU Provider Note (Signed)
History     CSN: [id number]  Arrival date and time: 01/07/21 1526   Event Date/Time   First Provider Initiated Contact with Patient 01/07/21 1628      Chief Complaint  Patient presents with  . Nausea  . Loss of Consciousness  . Headache   Veronica Moore is a 25 y.o. [id number] at [redacted]w[redacted]d who receives care at [facility].  She presents today for Nausea, Loss of Consciousness, and Headache.  She reports she had just completed her lunch and started feeling dizzy.  She states her ears got "a little hot and my vision got a little blurry and went out."  She states she fell out of a chair, but did not hit her head.  She states now she feels dizzy which is worsened with rapid eye movement, looking up, or standing up too fast.  She reports some ringing in her ears currently, but is otherwise fine.  She also reports a "headache in the back of my head, but it has been going on for the past couple of days."  She states she took two tylenol with unknown effects as she fell asleep for the night.  She reports the headache is worsened with taking zofran, but is improved with sleep.  Patient rates the headache a 2/10.  However, she reports some evenings it gets up to a 8/10 and requires her to turn off all noise and lights.  She states the HA did not start until she started the zofran.    OB History    Gravida  4   Para  1   Term  1   Preterm  0   AB  2   Living  1     SAB  1   IAB      Ectopic  1   Multiple  0   Live Births  1           Past Medical History:  Diagnosis Date  . ADHD (attention deficit hyperactivity disorder)   . Alcohol abuse, in remission   . Allergy   . 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 Bipolar 1 disorder, depressed (HCC)   . Bipolar depression (HCC)   . Bipolar disorder (HCC)   . IUD (intrauterine device) in place 07/01/2017   Paragard placed 05/05/17  . IUD (intrauterine device) in place 07/01/2017   Paragard IUD  . Pregnancy of unknown anatomic  location 04/23/2020  . PTSD (post-traumatic stress disorder)   . PTSD (post-traumatic stress disorder)   . Suicide threat or attempt    history of SI and history of attempt 2015    Past Surgical History:  Procedure Laterality Date  . NO PAST SURGERIES      Family History  Problem Relation Age of Onset  . ADD / ADHD Mother   . Alcohol abuse Mother   . Anxiety disorder Mother   . Bipolar disorder Mother   . Drug abuse Mother   . Seizures Mother   . Sexual abuse Mother   . ADD / ADHD Father   . Alcohol abuse Father   . Anxiety disorder Father   . Bipolar disorder Father   . Depression Father   . Drug abuse Father   . ADD / ADHD Sister   . ADD / ADHD Brother   . Dementia Maternal Grandmother   . Seizures Cousin   . Schizophrenia Cousin   . ADD / ADHD Sister     Social History   Tobacco  Use  . Smoking status: Former Smoker    Packs/day: 1.00    Years: 8.00    Pack years: 8.00    Types: Cigarettes    Quit date: 04/24/2019    Years since quitting: 1.7  . Smokeless tobacco: Never Used  . Tobacco comment: Now smoking 1-2 a day.  Vaping Use  . Vaping Use: Never used  Substance Use Topics  . Alcohol use: Not Currently    Comment: ocassionally  . Drug use: Not Currently    Types: Marijuana    Comment: none with pregnancy    Allergies:  Allergies  Allergen Reactions  . Red Dye Anaphylaxis and Other (See Comments)    Pt is allergic to red dye 40.   . Latex Itching  . Pineapple Swelling and Other (See Comments)    Reaction:  Mouth/lip swelling     Medications Prior to Admission  Medication Sig Dispense Refill Last Dose  . ondansetron (ZOFRAN ODT) 4 MG disintegrating tablet Take 1 tablet (4 mg total) by mouth every 8 (eight) hours as needed for nausea or vomiting. 30 tablet 1 01/06/2021 at Unknown time  . pantoprazole (PROTONIX) 40 MG tablet Take 1 tablet (40 mg total) by mouth daily. 30 tablet 6 01/07/2021 at Unknown time  . terconazole (TERAZOL 7) 0.4 % vaginal  cream Place 1 applicator vaginally at bedtime. For three nights. 45 g 0 01/06/2021 at Unknown time  . Blood Pressure Monitoring (BLOOD PRESSURE MONITOR AUTOMAT) DEVI 1 Device by Does not apply route daily. Automatic blood pressure cuff regular size. To monitor blood pressure regularly at home. ICD-10 code:Z34.90 1 each 0   . lithium carbonate 150 MG capsule Take 30 mg by mouth 3 (three) times daily with meals.     . metoCLOPramide (REGLAN) 10 MG tablet Take 1 tablet (10 mg total) by mouth every 6 (six) hours as needed for nausea. 30 tablet 6   . metroNIDAZOLE (FLAGYL) 500 MG tablet Take 1 tablet (500 mg total) by mouth 2 (two) times daily. 14 tablet 0   . Misc. Devices (GOJJI WEIGHT SCALE) MISC 1 Device by Does not apply route daily as needed. To weight self daily as needed at home. ICD-10 code: Z34.90 1 each 0   . nystatin cream (MYCOSTATIN) Apply 1 application topically 2 (two) times daily. 30 g 0     Review of Systems  Constitutional: Negative for chills and fever.  Eyes: Positive for visual disturbance (Blurry "fades in and out").  Respiratory: Negative for cough, chest tightness and shortness of breath.   Cardiovascular: Negative for chest pain.  Gastrointestinal: Positive for constipation and nausea. Negative for abdominal pain, diarrhea and vomiting.  Genitourinary: Negative for difficulty urinating, dysuria, vaginal bleeding and vaginal discharge.  Musculoskeletal: Negative for back pain.  Neurological: Positive for dizziness and headaches. Negative for weakness and numbness.   Physical Exam   Blood pressure 140/82, pulse 91, temperature 99 F (37.2 C), temperature source Oral, resp. rate 16, height 5' (1.524 m), weight 60.1 kg, last menstrual period 10/28/2020, SpO2 96 %.  Physical Exam Vitals reviewed.  Constitutional:      Appearance: Normal appearance. She is well-developed.  HENT:     Head: Normocephalic and atraumatic.  Eyes:     Conjunctiva/sclera: Conjunctivae normal.   Cardiovascular:     Rate and Rhythm: Normal rate and regular rhythm.     Heart sounds: Normal heart sounds.  Pulmonary:     Effort: Pulmonary effort is normal.  Breath sounds: Normal breath sounds.  Abdominal:     Palpations: Abdomen is soft.  Musculoskeletal:        General: No swelling or tenderness. Normal range of motion.     Cervical back: Normal range of motion.  Skin:    General: Skin is warm and dry.  Neurological:     General: No focal deficit present.     Mental Status: She is alert and oriented to person, place, and time.     Cranial Nerves: Cranial nerves are intact.     Motor: Motor function is intact. No weakness, abnormal muscle tone or pronator drift.  Psychiatric:        Mood and Affect: Mood normal.        Behavior: Behavior normal.        Thought Content: Thought content normal.     MAU Course  Procedures Results for orders placed or performed during the hospital encounter of 01/07/21 (from the past 24 hour(s))  Urinalysis, Routine w reflex microscopic Urine, Clean Catch     Status: Abnormal   Collection Time: 01/07/21  4:20 PM  Result Value Ref Range   Color, Urine STRAW (A) YELLOW   APPearance CLEAR CLEAR   Specific Gravity, Urine 1.005 1.005 - 1.030   pH 6.0 5.0 - 8.0   Glucose, UA NEGATIVE NEGATIVE mg/dL   Hgb urine dipstick NEGATIVE NEGATIVE   Bilirubin Urine NEGATIVE NEGATIVE   Ketones, ur NEGATIVE NEGATIVE mg/dL   Protein, ur NEGATIVE NEGATIVE mg/dL   Nitrite NEGATIVE NEGATIVE   Leukocytes,Ua NEGATIVE NEGATIVE  Glucose, capillary     Status: Abnormal   Collection Time: 01/07/21  4:51 PM  Result Value Ref Range   Glucose-Capillary 109 (H) 70 - 99 mg/dL    MDM Exam Consult Labs: CBG Assessment and Plan  25 year old, [id number] SIUP at 10.1 weeks Syncope Dizziness Headache  -Discussed complaints and informed that orthostatic bps normal range. -Will collect CBG -Brief discussion of possibility of CT scan. -Dr. Gildardo Griffes consulted  and informed of patient complaint, status, and recommendation for CT scan. Advised: *No need for CT scan with lack of head trauma. *Send to K. Teague-Clark, NP for headache assessment.  *Offer pain medications. -Provider to bedside and discussed MD recommendation. -Patient declines pain medication. -Discussed high protein snacks and increasing water intake. -Encouraged to obtain bp cuff for home monitoring.  Instructed to call office if unable to get cuff before next visit.  -Patient questions if syncope/dizziness episode will occur again and informed that provider can not predict these occurrences. -Encouraged rest. -Encouraged to call or return to MAU if symptoms worsen or with the onset of new symptoms. -Discharged to home in stable condition.   Cherre Robins 01/07/2021, 4:28 PM

## 2021-01-07 NOTE — MAU Note (Signed)
Veronica Moore is a 25 y.o. at [redacted]w[redacted]d here in MAU reporting: states around 1400 she blacked out while she was at work. States she had just gotten done eating lunch, her vision got blurry and she felt very hot. States she hit the floor. Had vomiting this AM but none since then. Has been taking her zofran at home. Also has a headache.   Onset of complaint: ongoing  Pain score: 2/10  Vitals:   01/07/21 1540  BP: 123/83  Pulse: 91  Resp: 16  Temp: 99 F (37.2 C)  SpO2: 96%     Lab orders placed from triage: UA

## 2021-01-08 LAB — CYTOLOGY - PAP
Comment: NEGATIVE
Diagnosis: UNDETERMINED — AB
High risk HPV: NEGATIVE

## 2021-01-09 ENCOUNTER — Encounter: Payer: Medicaid Other | Admitting: Obstetrics and Gynecology

## 2021-01-27 ENCOUNTER — Encounter: Payer: Self-pay | Admitting: *Deleted

## 2021-02-13 ENCOUNTER — Other Ambulatory Visit: Payer: Self-pay

## 2021-02-13 ENCOUNTER — Ambulatory Visit (INDEPENDENT_AMBULATORY_CARE_PROVIDER_SITE_OTHER): Payer: Medicaid Other | Admitting: Obstetrics and Gynecology

## 2021-02-13 ENCOUNTER — Encounter: Payer: Self-pay | Admitting: Obstetrics and Gynecology

## 2021-02-13 VITALS — BP 122/78 | HR 118 | Temp 97.9°F | Wt 130.8 lb

## 2021-02-13 DIAGNOSIS — Z3A15 15 weeks gestation of pregnancy: Secondary | ICD-10-CM

## 2021-02-13 DIAGNOSIS — O0990 Supervision of high risk pregnancy, unspecified, unspecified trimester: Secondary | ICD-10-CM | POA: Diagnosis not present

## 2021-02-13 NOTE — Progress Notes (Signed)
   LOW-RISK PREGNANCY OFFICE VISIT Patient name: Veronica Moore MRN [id number]  Date of birth: 11/13/1996 Chief Complaint:   Routine Prenatal Visit  History of Present Illness:   Veronica Moore is a 25 y.o. G82P1021 female at [redacted]w[redacted]d with an Estimated Date of Delivery: 08/04/21 being seen today for ongoing management of a low-risk pregnancy.  Today she reports no complaints. Contractions: Not present. Vag. Bleeding: None.  Movement: Present. denies leaking of fluid. Review of Systems:   Pertinent items are noted in HPI Denies abnormal vaginal discharge w/ itching/odor/irritation, headaches, visual changes, shortness of breath, chest pain, abdominal pain, severe nausea/vomiting, or problems with urination or bowel movements unless otherwise stated above. Pertinent History Reviewed:  Reviewed past medical,surgical, social, obstetrical and family history.  Reviewed problem list, medications and allergies. Physical Assessment:   Vitals:   02/13/21 0918  BP: 122/78  Pulse: (!) 118  Temp: 97.9 F (36.6 C)  Weight: 130 lb 12.8 oz (59.3 kg)  Body mass index is 25.55 kg/m.        Physical Examination:   General appearance: Well appearing, and in no distress  Mental status: Alert, oriented to person, place, and time  Skin: Warm & dry  Cardiovascular: Normal heart rate noted  Respiratory: Normal respiratory effort, no distress  Abdomen: Soft, gravid, nontender  Pelvic: Cervical exam deferred         Extremities: Edema: None  Fetal Status: Fetal Heart Rate (bpm): 153   Movement: Present    Patient informed that the ultrasound is considered a limited OB ultrasound and is not intended to be a complete ultrasound exam.  Patient also informed that the ultrasound is not being completed with the intent of assessing for fetal or placental anomalies or any pelvic abnormalities.  Explained that the purpose of today's ultrasound is to assess for socially for husband.  Baby was found to be very active.  Patient acknowledges the purpose of the exam and the limitations of the study.  Assessment & Plan:  1) Low-risk pregnancy G4P1021 at [redacted]w[redacted]d with an Estimated Date of Delivery: 08/04/21   2) Supervision of high risk pregnancy, antepartum  - Genetic Screening (Panorama) - AFP, Serum, Open Spina Bifida - Anatomy U/S scheduled for 03/10/2021  3) [redacted] weeks gestation of pregnancy    Meds: No orders of the defined types were placed in this encounter.  Labs/procedures today: AFP, Panorama and U/S   Plan:  Continue routine obstetrical care   Reviewed: Preterm labor symptoms and general obstetric precautions including but not limited to vaginal bleeding, contractions, leaking of fluid and fetal movement were reviewed in detail with the patient.  All questions were answered. Has home bp cuff. Check bp weekly, let us know if >140/90.   Follow-up: Return in about 4 weeks (around 03/13/2021) for Return OB - My Chart video.  Orders Placed This Encounter  Procedures  . Genetic Screening  . AFP, Serum, Open Spina Bifida   Raelyn Mora MSN, CNM 02/13/2021 9:56 AM

## 2021-02-15 LAB — AFP, SERUM, OPEN SPINA BIFIDA
AFP MoM: 0.76
AFP Value: 24.7 ng/mL
Gest. Age on Collection Date: 15 weeks
Maternal Age At EDD: 26.8 yr
OSBR Risk 1 IN: 10000
Test Results:: NEGATIVE
Weight: 130 [lb_av]

## 2021-02-21 ENCOUNTER — Telehealth: Payer: Self-pay | Admitting: Advanced Practice Midwife

## 2021-02-21 ENCOUNTER — Inpatient Hospital Stay (HOSPITAL_BASED_OUTPATIENT_CLINIC_OR_DEPARTMENT_OTHER): Payer: Medicaid Other

## 2021-02-21 ENCOUNTER — Inpatient Hospital Stay (HOSPITAL_COMMUNITY)
Admission: AD | Admit: 2021-02-21 | Discharge: 2021-02-21 | Disposition: A | Discharge status: Home / Self Care | Payer: Medicaid Other | Attending: Obstetrics and Gynecology | Admitting: Obstetrics and Gynecology

## 2021-02-21 ENCOUNTER — Other Ambulatory Visit: Payer: Self-pay

## 2021-02-21 ENCOUNTER — Encounter (HOSPITAL_COMMUNITY): Payer: Self-pay | Admitting: Obstetrics and Gynecology

## 2021-02-21 DIAGNOSIS — Y92009 Unspecified place in unspecified non-institutional (private) residence as the place of occurrence of the external cause: Secondary | ICD-10-CM | POA: Diagnosis not present

## 2021-02-21 DIAGNOSIS — O9A212 Injury, poisoning and certain other consequences of external causes complicating pregnancy, second trimester: Secondary | ICD-10-CM

## 2021-02-21 DIAGNOSIS — O321XX0 Maternal care for breech presentation, not applicable or unspecified: Secondary | ICD-10-CM

## 2021-02-21 DIAGNOSIS — O99891 Other specified diseases and conditions complicating pregnancy: Secondary | ICD-10-CM | POA: Diagnosis not present

## 2021-02-21 DIAGNOSIS — W19XXXA Unspecified fall, initial encounter: Secondary | ICD-10-CM

## 2021-02-21 DIAGNOSIS — O0990 Supervision of high risk pregnancy, unspecified, unspecified trimester: Secondary | ICD-10-CM

## 2021-02-21 DIAGNOSIS — R55 Syncope and collapse: Secondary | ICD-10-CM | POA: Diagnosis not present

## 2021-02-21 DIAGNOSIS — R42 Dizziness and giddiness: Secondary | ICD-10-CM | POA: Diagnosis not present

## 2021-02-21 DIAGNOSIS — Z3A16 16 weeks gestation of pregnancy: Secondary | ICD-10-CM

## 2021-02-21 LAB — COMPREHENSIVE METABOLIC PANEL
ALT: 11 U/L (ref 0–44)
AST: 13 U/L — ABNORMAL LOW (ref 15–41)
Albumin: 3.3 g/dL — ABNORMAL LOW (ref 3.5–5.0)
Alkaline Phosphatase: 47 U/L (ref 38–126)
Anion gap: 7 (ref 5–15)
BUN: 6 mg/dL (ref 6–20)
CO2: 23 mmol/L (ref 22–32)
Calcium: 9 mg/dL (ref 8.9–10.3)
Chloride: 106 mmol/L (ref 98–111)
Creatinine, Ser: 0.52 mg/dL (ref 0.44–1.00)
GFR, Estimated: >60 mL/min (ref >60)
Glucose, Bld: 81 mg/dL (ref 70–99)
Potassium: 4 mmol/L (ref 3.5–5.1)
Sodium: 136 mmol/L (ref 135–145)
Total Bilirubin: 0.4 mg/dL (ref 0.3–1.2)
Total Protein: 6.5 g/dL (ref 6.5–8.1)

## 2021-02-21 LAB — URINALYSIS, ROUTINE W REFLEX MICROSCOPIC
Bilirubin Urine: NEGATIVE
Glucose, UA: NEGATIVE mg/dL
Hgb urine dipstick: NEGATIVE
Ketones, ur: NEGATIVE mg/dL
Nitrite: NEGATIVE
Protein, ur: NEGATIVE mg/dL
Specific Gravity, Urine: 1.019 (ref 1.005–1.030)
pH: 5 (ref 5.0–8.0)

## 2021-02-21 LAB — CBC
HCT: 31.4 % — ABNORMAL LOW (ref 36.0–46.0)
Hemoglobin: 11.2 g/dL — ABNORMAL LOW (ref 12.0–15.0)
MCH: 31.3 pg (ref 26.0–34.0)
MCHC: 35.7 g/dL (ref 30.0–36.0)
MCV: 87.7 fL (ref 80.0–100.0)
Platelets: 270 10*3/uL (ref 150–400)
RBC: 3.58 MIL/uL — ABNORMAL LOW (ref 3.87–5.11)
RDW: 13.9 % (ref 11.5–15.5)
WBC: 7.5 10*3/uL (ref 4.0–10.5)
nRBC: 0 % (ref 0.0–0.2)

## 2021-02-21 LAB — RAPID URINE DRUG SCREEN, HOSP PERFORMED
Amphetamines: NOT DETECTED
Barbiturates: NOT DETECTED
Benzodiazepines: NOT DETECTED
Cocaine: NOT DETECTED
Opiates: NOT DETECTED
Tetrahydrocannabinol: NOT DETECTED

## 2021-02-21 NOTE — Telephone Encounter (Signed)
Received a call from patient who stated she had blacked out twice around 9:30 am. She was outside, and was able to make her way in the house. She took her blood pressure which was 106/71. I called MAU, and spoke to [facility] CNM. She informed me to have the patient come to the hospital for evaluation.

## 2021-02-21 NOTE — MAU Note (Signed)
Veronica Moore is a 25 y.o. at [redacted]w[redacted]d here in MAU reporting: fainted around 0900 this morning. States it came out of nowhere. States she has been drinking and eating well.   Onset of complaint: today  Pain score: 0/10  Vitals:   02/21/21 1114  BP: 116/66  Pulse: 91  Resp: 16  Temp: 99 F (37.2 C)  SpO2: 99%     Lab orders placed from triage: UA
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
## 2021-02-24 ENCOUNTER — Encounter: Payer: Self-pay | Admitting: *Deleted

## 2021-02-25 ENCOUNTER — Encounter: Payer: Self-pay | Admitting: Obstetrics and Gynecology

## 2021-02-25 ENCOUNTER — Other Ambulatory Visit: Payer: Self-pay | Admitting: Obstetrics and Gynecology

## 2021-02-25 DIAGNOSIS — R55 Syncope and collapse: Secondary | ICD-10-CM | POA: Insufficient documentation

## 2021-02-27 ENCOUNTER — Other Ambulatory Visit: Payer: Self-pay | Admitting: Obstetrics and Gynecology

## 2021-02-27 ENCOUNTER — Telehealth: Payer: Self-pay | Admitting: Obstetrics and Gynecology

## 2021-02-27 DIAGNOSIS — R55 Syncope and collapse: Secondary | ICD-10-CM

## 2021-02-27 NOTE — Telephone Encounter (Signed)
Left voice message for patient to return nurse call.  Rosellen Lichtenberger L, RN  

## 2021-02-27 NOTE — Telephone Encounter (Signed)
Patient called to say she passed something that looked like her mucus plug. Stated she had some cramping, but no bleeding.

## 2021-03-10 ENCOUNTER — Ambulatory Visit: Payer: Medicaid Other | Attending: Obstetrics and Gynecology

## 2021-03-10 ENCOUNTER — Other Ambulatory Visit: Payer: Self-pay | Admitting: Obstetrics and Gynecology

## 2021-03-10 ENCOUNTER — Other Ambulatory Visit: Payer: Self-pay

## 2021-03-10 DIAGNOSIS — O0990 Supervision of high risk pregnancy, unspecified, unspecified trimester: Secondary | ICD-10-CM

## 2021-03-11 ENCOUNTER — Other Ambulatory Visit: Payer: Self-pay | Admitting: *Deleted

## 2021-03-11 DIAGNOSIS — O09299 Supervision of pregnancy with other poor reproductive or obstetric history, unspecified trimester: Secondary | ICD-10-CM

## 2021-03-13 ENCOUNTER — Encounter: Payer: Self-pay | Admitting: Obstetrics and Gynecology

## 2021-03-13 ENCOUNTER — Ambulatory Visit (INDEPENDENT_AMBULATORY_CARE_PROVIDER_SITE_OTHER): Payer: Medicaid Other | Admitting: Licensed Clinical Social Worker

## 2021-03-13 ENCOUNTER — Other Ambulatory Visit: Payer: Self-pay

## 2021-03-13 ENCOUNTER — Telehealth (INDEPENDENT_AMBULATORY_CARE_PROVIDER_SITE_OTHER): Payer: Medicaid Other | Admitting: Obstetrics and Gynecology

## 2021-03-13 ENCOUNTER — Telehealth: Payer: Medicaid Other | Admitting: Obstetrics and Gynecology

## 2021-03-13 VITALS — BP 114/69 | HR 80

## 2021-03-13 DIAGNOSIS — T7491XA Unspecified adult maltreatment, confirmed, initial encounter: Secondary | ICD-10-CM

## 2021-03-13 DIAGNOSIS — T7411XA Adult physical abuse, confirmed, initial encounter: Secondary | ICD-10-CM

## 2021-03-13 DIAGNOSIS — B009 Herpesviral infection, unspecified: Secondary | ICD-10-CM

## 2021-03-13 DIAGNOSIS — O9A312 Physical abuse complicating pregnancy, second trimester: Secondary | ICD-10-CM

## 2021-03-13 DIAGNOSIS — O99342 Other mental disorders complicating pregnancy, second trimester: Secondary | ICD-10-CM

## 2021-03-13 DIAGNOSIS — F332 Major depressive disorder, recurrent severe without psychotic features: Secondary | ICD-10-CM

## 2021-03-13 DIAGNOSIS — O0990 Supervision of high risk pregnancy, unspecified, unspecified trimester: Secondary | ICD-10-CM

## 2021-03-13 DIAGNOSIS — R55 Syncope and collapse: Secondary | ICD-10-CM

## 2021-03-13 DIAGNOSIS — O99891 Other specified diseases and conditions complicating pregnancy: Secondary | ICD-10-CM

## 2021-03-13 DIAGNOSIS — Z3A19 19 weeks gestation of pregnancy: Secondary | ICD-10-CM

## 2021-03-13 DIAGNOSIS — O98512 Other viral diseases complicating pregnancy, second trimester: Secondary | ICD-10-CM

## 2021-03-13 DIAGNOSIS — F319 Bipolar disorder, unspecified: Secondary | ICD-10-CM
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
## 2021-03-13 NOTE — Patient Instructions (Signed)
omestic Violence and Pregnancy Intimate partner violence is any type of physical, sexual, or emotional harm done by a current or former partner. Emotional abuse includes threatening and controlling behavior. Intimate partner violence is also called domestic violence. Intimate partner violence is especially dangerous during pregnancy because the abuse may affect you and your developing baby. Call a domestic violence hotline or let your health care provider know if you are being physically or sexually abused, or if your partner's threatening or controlling behavior is making you feel unsafe. Getting help and support protects you, your pregnancy, and your baby. How does this affect me? Intimate partner violence can cause:  Physical effects, such as: ? Injury or death (homicide). ? Digestion problems. ? Loss of appetite. ? Frequent infections, including sexually transmitted infections. ? High blood pressure.  Emotional effects, such as: ? Fear and worry. ? Trouble sleeping. ? Depression. ? Anxiety. ? Thoughts of hurting yourself or committing suicide. Intimate partner violence may affect your pregnancy in these ways:  You are more likely to be injured or have poor health.  You may be less likely to get prenatal care.  You may not gain a healthy amount of weight or have good nutrition.  You may be more likely to smoke, use drugs, and drink alcohol to relieve stress.  You may be at higher risk of losing your pregnancy (miscarriage or stillbirth).  Your baby may be born before 37 weeks of pregnancy (premature). How does this affect my baby? If you experience intimate partner violence during pregnancy:  Your baby may be injured.  Your baby may not survive the pregnancy (miscarriage or stillbirth).  Your baby may be born premature, which can cause mental and physical problems.  Your baby may not grow well in your womb and may be born small (small for gestational age).  If you use  alcohol, your baby may be born with fetal alcohol syndrome. This can cause birth defects and other problems.  You may have trouble bonding with your baby. This can lead to neglect.  You may be less likely to breastfeed. This is the healthiest way to feed your baby. Follow these instructions at home:  Let your health care provider know that you are experiencing intimate partner violence.  Do not bring an abusive partner with you to your prenatal visits. This will allow you to speak freely with your health care provider.  Learn about and use resources on intimate partner violence, such as the [employer] Violence Hotline: thehotline.org  Consider getting counseling.  Consider getting a legal document that says your partner has to stay away from you (restraining order).  Have a support person stay with you in your home. Have an escape plan to get to a safe place.  Do not smoke,use drugs, or drink alcohol to relieve stress.  Keep all your prenatal visits as told. This is important.   Where to find more information  The [employer] Violence Hotline: ? 24-hour hotline: 838-656-8084 or 9142395411 Gwenevere Abbot) ? Website: thehotline.org  The National Sexual Assault Hotline: ? 24-hour hotline: (705)551-1363 ? Website: rainn.org  Love is respect: ? Text "loveis" to 651-630-1894 ? Call: 585-036-6318 or 705-888-2744 Gwenevere Abbot) ? Website: loveisrespect.org If you do not feel safe searching for help online at home, use a computer at a [employer] to access the internet. Call 911 if you are in immediate danger or need medical help. Contact a health care provider if:  You experience any type of intimate partner violence at  home.  You need help to quit smoking, drinking, or taking drugs. Get help right away if:  You do not feel safe at home.  You have thoughts of hurting yourself or committing suicide. If you ever feel like you may hurt yourself or others, or have thoughts about taking your  own life, get help right away. Go to your nearest emergency department or:  Call your local emergency services (911 in the [place]  Call a suicide crisis helpline, such as the National Suicide Prevention Lifeline at (416) 193-7352. This is open 24 hours a day in the [place]  Text the Crisis Text Line at 613 710 0933 (in the [place] Summary  Intimate partner violence is also called domestic violence. This kind of violence can be physical, sexual, or emotional.  Intimate partner violence is especially dangerous during pregnancy. It can increase your baby's risk of miscarriage, stillbirth, and premature birth.  Tell your health care provider if you experience any type of intimate partner violence. Get help right away if you do not feel safe at home. This information is not intended to replace advice given to you by your health care provider. Make sure you discuss any questions you have with your health care provider. Document Revised: 03/22/2020 Document Reviewed: 03/22/2020 Elsevier Patient Education  2021 [employer].

## 2021-04-14 ENCOUNTER — Encounter: Payer: Self-pay | Admitting: *Deleted

## 2021-04-14 ENCOUNTER — Other Ambulatory Visit: Payer: Self-pay | Admitting: *Deleted

## 2021-04-14 ENCOUNTER — Ambulatory Visit: Payer: Medicaid Other | Attending: Obstetrics

## 2021-04-14 ENCOUNTER — Ambulatory Visit: Payer: Medicaid Other | Admitting: *Deleted

## 2021-04-14 ENCOUNTER — Other Ambulatory Visit: Payer: Self-pay

## 2021-04-14 DIAGNOSIS — O09299 Supervision of pregnancy with other poor reproductive or obstetric history, unspecified trimester: Secondary | ICD-10-CM | POA: Insufficient documentation

## 2021-04-14 DIAGNOSIS — O321XX0 Maternal care for breech presentation, not applicable or unspecified: Secondary | ICD-10-CM | POA: Diagnosis not present

## 2021-04-14 DIAGNOSIS — O0990 Supervision of high risk pregnancy, unspecified, unspecified trimester: Secondary | ICD-10-CM | POA: Diagnosis present

## 2021-04-14 DIAGNOSIS — O09292 Supervision of pregnancy with other poor reproductive or obstetric history, second trimester: Secondary | ICD-10-CM

## 2021-04-14 DIAGNOSIS — Z3A24 24 weeks gestation of pregnancy: Secondary | ICD-10-CM

## 2021-04-17 ENCOUNTER — Encounter: Payer: Self-pay | Admitting: Obstetrics and Gynecology

## 2021-04-17 ENCOUNTER — Telehealth (INDEPENDENT_AMBULATORY_CARE_PROVIDER_SITE_OTHER): Payer: Medicaid Other | Admitting: Obstetrics and Gynecology

## 2021-04-17 VITALS — BP 111/67 | HR 84 | Wt 140.2 lb

## 2021-04-17 DIAGNOSIS — Z3A24 24 weeks gestation of pregnancy: Secondary | ICD-10-CM

## 2021-04-17 DIAGNOSIS — T7491XA Unspecified adult maltreatment, confirmed, initial encounter: Secondary | ICD-10-CM | POA: Insufficient documentation

## 2021-04-17 DIAGNOSIS — T7491XD Unspecified adult maltreatment, confirmed, subsequent encounter: Secondary | ICD-10-CM

## 2021-04-17 DIAGNOSIS — O0990 Supervision of high risk pregnancy, unspecified, unspecified trimester: Secondary | ICD-10-CM

## 2021-04-17 DIAGNOSIS — O9A312 Physical abuse complicating pregnancy, second trimester: Secondary | ICD-10-CM
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
## 2021-04-29 ENCOUNTER — Encounter (HOSPITAL_COMMUNITY): Payer: Self-pay

## 2021-04-29 ENCOUNTER — Ambulatory Visit (HOSPITAL_COMMUNITY)
Admission: EM | Admit: 2021-04-29 | Discharge: 2021-04-29 | Disposition: A | Discharge status: Home / Self Care | Payer: Medicaid Other | Attending: Medical Oncology | Admitting: Medical Oncology

## 2021-04-29 DIAGNOSIS — R42 Dizziness and giddiness: Secondary | ICD-10-CM | POA: Insufficient documentation

## 2021-04-29 DIAGNOSIS — Z20822 Contact with and (suspected) exposure to covid-19: Secondary | ICD-10-CM | POA: Diagnosis not present

## 2021-04-29 DIAGNOSIS — J029 Acute pharyngitis, unspecified: Secondary | ICD-10-CM | POA: Diagnosis not present

## 2021-04-29 DIAGNOSIS — H6593 Unspecified nonsuppurative otitis media, bilateral: Secondary | ICD-10-CM | POA: Diagnosis not present

## 2021-04-29 DIAGNOSIS — Z79899 Other long term (current) drug therapy: Secondary | ICD-10-CM | POA: Diagnosis not present

## 2021-04-29 DIAGNOSIS — J069 Acute upper respiratory infection, unspecified: Secondary | ICD-10-CM

## 2021-04-29 DIAGNOSIS — Z87891 Personal history of nicotine dependence: Secondary | ICD-10-CM | POA: Insufficient documentation

## 2021-04-29 DIAGNOSIS — O26899 Other specified pregnancy related conditions, unspecified trimester: Secondary | ICD-10-CM | POA: Insufficient documentation

## 2021-04-29 LAB — SARS CORONAVIRUS 2 (TAT 6-24 HRS): SARS Coronavirus 2: NEGATIVE

## 2021-04-29 LAB — POCT RAPID STREP A, ED / UC: Streptococcus, Group A Screen (Direct): NEGATIVE

## 2021-04-29 MED ORDER — FLUTICASONE PROPIONATE 50 MCG/ACT NA SUSP: 2.0000 | Freq: Every day | NASAL | 0 refills | Status: DC

## 2021-04-29 NOTE — ED Triage Notes (Signed)
Pt c/o a sore throat, vomiting, dizziness and head congestion X 6 days.  Pt states the sore throat started Saturday.She states she has swelling in lymph nodes. Pt states there was a Strep case at her sons school.
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
## 2021-05-01 LAB — CULTURE, GROUP A STREP (THRC)

## 2021-05-08 ENCOUNTER — Ambulatory Visit: Payer: Self-pay | Admitting: Neurology

## 2021-05-16 ENCOUNTER — Ambulatory Visit (INDEPENDENT_AMBULATORY_CARE_PROVIDER_SITE_OTHER): Payer: Medicaid Other | Admitting: Certified Nurse Midwife

## 2021-05-16 ENCOUNTER — Other Ambulatory Visit: Payer: Self-pay

## 2021-05-16 ENCOUNTER — Encounter: Payer: Medicaid Other | Admitting: Certified Nurse Midwife

## 2021-05-16 VITALS — BP 117/77 | HR 96 | Wt 146.0 lb

## 2021-05-16 DIAGNOSIS — Z3A28 28 weeks gestation of pregnancy: Secondary | ICD-10-CM

## 2021-05-16 DIAGNOSIS — Z3493 Encounter for supervision of normal pregnancy, unspecified, third trimester: Secondary | ICD-10-CM | POA: Diagnosis not present

## 2021-05-16 DIAGNOSIS — O26893 Other specified pregnancy related conditions, third trimester: Secondary | ICD-10-CM

## 2021-05-16 DIAGNOSIS — M545 Low back pain, unspecified: Secondary | ICD-10-CM

## 2021-05-16 DIAGNOSIS — Z23 Encounter for immunization: Secondary | ICD-10-CM | POA: Diagnosis not present

## 2021-05-16 MED ORDER — MAG-OXIDE 200 MG PO TABS: 400.0000 mg | ORAL_TABLET | Freq: Every day | ORAL | 3 refills | Status: DC
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
## 2021-05-16 NOTE — Progress Notes (Signed)
PHQ: Score 0 GAD: Score 0

## 2021-05-16 NOTE — Patient Instructions (Signed)
Veronica Moore w/ [facility] [address] [place], [place] [address] [phone] [email or web address]  Check out [email or web address] for daily movements/stretches to encourage head down fetal positioning and relieve aches and pains of pregnancy.

## 2021-05-17 LAB — GLUCOSE TOLERANCE, 2 HOURS W/ 1HR
Glucose, 1 hour: 164 mg/dL (ref 65–179)
Glucose, 2 hour: 107 mg/dL (ref 65–152)
Glucose, Fasting: 77 mg/dL (ref 65–91)

## 2021-05-17 LAB — CBC
Hematocrit: 32.5 % — ABNORMAL LOW (ref 34.0–46.6)
Hemoglobin: 10.6 g/dL — ABNORMAL LOW (ref 11.1–15.9)
MCH: 28.7 pg (ref 26.6–33.0)
MCHC: 32.6 g/dL (ref 31.5–35.7)
MCV: 88 fL (ref 79–97)
Platelets: 226 10*3/uL (ref 150–450)
RBC: 3.69 x10E6/uL — ABNORMAL LOW (ref 3.77–5.28)
RDW: 11.8 % (ref 11.7–15.4)
WBC: 7.6 10*3/uL (ref 3.4–10.8)

## 2021-05-17 LAB — RPR: RPR Ser Ql: NONREACTIVE

## 2021-05-17 LAB — HIV ANTIBODY (ROUTINE TESTING W REFLEX): HIV Screen 4th Generation wRfx: NONREACTIVE

## 2021-05-21 ENCOUNTER — Other Ambulatory Visit: Payer: Self-pay | Admitting: Obstetrics and Gynecology

## 2021-05-21 ENCOUNTER — Inpatient Hospital Stay (HOSPITAL_COMMUNITY)
Admission: AD | Admit: 2021-05-21 | Discharge: 2021-05-21 | Disposition: A | Discharge status: Home / Self Care | Payer: Medicaid Other | Attending: Obstetrics & Gynecology | Admitting: Obstetrics & Gynecology

## 2021-05-21 ENCOUNTER — Encounter: Payer: Medicaid Other | Admitting: Obstetrics and Gynecology

## 2021-05-21 ENCOUNTER — Other Ambulatory Visit: Payer: Self-pay

## 2021-05-21 ENCOUNTER — Encounter (HOSPITAL_COMMUNITY): Payer: Self-pay | Admitting: Obstetrics & Gynecology

## 2021-05-21 DIAGNOSIS — F909 Attention-deficit hyperactivity disorder, unspecified type: Secondary | ICD-10-CM | POA: Diagnosis not present

## 2021-05-21 DIAGNOSIS — O99283 Endocrine, nutritional and metabolic diseases complicating pregnancy, third trimester: Secondary | ICD-10-CM | POA: Diagnosis not present

## 2021-05-21 DIAGNOSIS — O4703 False labor before 37 completed weeks of gestation, third trimester: Secondary | ICD-10-CM

## 2021-05-21 DIAGNOSIS — Z79899 Other long term (current) drug therapy: Secondary | ICD-10-CM | POA: Insufficient documentation

## 2021-05-21 DIAGNOSIS — O99013 Anemia complicating pregnancy, third trimester: Secondary | ICD-10-CM | POA: Insufficient documentation

## 2021-05-21 DIAGNOSIS — E876 Hypokalemia: Secondary | ICD-10-CM | POA: Diagnosis not present

## 2021-05-21 DIAGNOSIS — Z87891 Personal history of nicotine dependence: Secondary | ICD-10-CM | POA: Diagnosis not present

## 2021-05-21 DIAGNOSIS — O99413 Diseases of the circulatory system complicating pregnancy, third trimester: Secondary | ICD-10-CM | POA: Diagnosis not present

## 2021-05-21 DIAGNOSIS — F319 Bipolar disorder, unspecified: Secondary | ICD-10-CM | POA: Insufficient documentation

## 2021-05-21 DIAGNOSIS — R000 Tachycardia, unspecified: Secondary | ICD-10-CM | POA: Insufficient documentation

## 2021-05-21 DIAGNOSIS — Z3A29 29 weeks gestation of pregnancy: Secondary | ICD-10-CM | POA: Diagnosis not present

## 2021-05-21 DIAGNOSIS — D649 Anemia, unspecified: Secondary | ICD-10-CM

## 2021-05-21 DIAGNOSIS — O99343 Other mental disorders complicating pregnancy, third trimester: Secondary | ICD-10-CM | POA: Diagnosis not present

## 2021-05-21 DIAGNOSIS — O99891 Other specified diseases and conditions complicating pregnancy: Secondary | ICD-10-CM

## 2021-05-21 DIAGNOSIS — E875 Hyperkalemia: Secondary | ICD-10-CM | POA: Insufficient documentation

## 2021-05-21 DIAGNOSIS — O479 False labor, unspecified: Secondary | ICD-10-CM

## 2021-05-21 DIAGNOSIS — E860 Dehydration: Secondary | ICD-10-CM | POA: Insufficient documentation

## 2021-05-21 LAB — COMPREHENSIVE METABOLIC PANEL
ALT: 13 U/L (ref 0–44)
AST: 16 U/L (ref 15–41)
Albumin: 2.5 g/dL — ABNORMAL LOW (ref 3.5–5.0)
Alkaline Phosphatase: 74 U/L (ref 38–126)
Anion gap: 7 (ref 5–15)
BUN: 6 mg/dL (ref 6–20)
CO2: 22 mmol/L (ref 22–32)
Calcium: 8.2 mg/dL — ABNORMAL LOW (ref 8.9–10.3)
Chloride: 105 mmol/L (ref 98–111)
Creatinine, Ser: 0.51 mg/dL (ref 0.44–1.00)
GFR, Estimated: >60 mL/min (ref >60)
Glucose, Bld: 124 mg/dL — ABNORMAL HIGH (ref 70–99)
Potassium: 3.3 mmol/L — ABNORMAL LOW (ref 3.5–5.1)
Sodium: 134 mmol/L — ABNORMAL LOW (ref 135–145)
Total Bilirubin: 0.6 mg/dL (ref 0.3–1.2)
Total Protein: 5.5 g/dL — ABNORMAL LOW (ref 6.5–8.1)

## 2021-05-21 LAB — WET PREP, GENITAL
Clue Cells Wet Prep HPF POC: NONE SEEN
Sperm: NONE SEEN
Trich, Wet Prep: NONE SEEN
Yeast Wet Prep HPF POC: NONE SEEN

## 2021-05-21 LAB — URINALYSIS, ROUTINE W REFLEX MICROSCOPIC
Bacteria, UA: NONE SEEN
Bilirubin Urine: NEGATIVE
Glucose, UA: >=500 mg/dL — AB
Hgb urine dipstick: NEGATIVE
Ketones, ur: 5 mg/dL — AB
Nitrite: NEGATIVE
Protein, ur: NEGATIVE mg/dL
Specific Gravity, Urine: 1.025 (ref 1.005–1.030)
pH: 5 (ref 5.0–8.0)

## 2021-05-21 LAB — CBC WITH DIFFERENTIAL/PLATELET
Abs Immature Granulocytes: 0.04 10*3/uL (ref 0.00–0.07)
Basophils Absolute: 0 10*3/uL (ref 0.0–0.1)
Basophils Relative: 0 %
Eosinophils Absolute: 0.1 10*3/uL (ref 0.0–0.5)
Eosinophils Relative: 1 %
HCT: 28.9 % — ABNORMAL LOW (ref 36.0–46.0)
Hemoglobin: 9.7 g/dL — ABNORMAL LOW (ref 12.0–15.0)
Immature Granulocytes: 1 %
Lymphocytes Relative: 19 %
Lymphs Abs: 1.4 10*3/uL (ref 0.7–4.0)
MCH: 29.2 pg (ref 26.0–34.0)
MCHC: 33.6 g/dL (ref 30.0–36.0)
MCV: 87 fL (ref 80.0–100.0)
Monocytes Absolute: 0.6 10*3/uL (ref 0.1–1.0)
Monocytes Relative: 8 %
Neutro Abs: 5.4 10*3/uL (ref 1.7–7.7)
Neutrophils Relative %: 71 %
Platelets: 199 10*3/uL (ref 150–400)
RBC: 3.32 MIL/uL — ABNORMAL LOW (ref 3.87–5.11)
RDW: 12.4 % (ref 11.5–15.5)
WBC: 7.5 10*3/uL (ref 4.0–10.5)
nRBC: 0 % (ref 0.0–0.2)

## 2021-05-21 MED ORDER — POTASSIUM CHLORIDE CRYS ER 20 MEQ PO TBCR: 20.0000 meq | EXTENDED_RELEASE_TABLET | Freq: Two times a day (BID) | ORAL | 0 refills | Status: DC

## 2021-05-21 MED ORDER — NIFEDIPINE 10 MG PO CAPS: 10.0000 mg | ORAL_CAPSULE | Freq: Three times a day (TID) | ORAL | 0 refills | Status: DC

## 2021-05-21 MED ORDER — ACETAMINOPHEN 500 MG PO TABS
1000.0000 mg | ORAL_TABLET | Freq: Once | ORAL | Status: AC
  Administered 2021-05-21: 1000 mg via ORAL
  Filled 2021-05-21: qty 2

## 2021-05-21 MED ORDER — NIFEDIPINE 10 MG PO CAPS
10.0000 mg | ORAL_CAPSULE | Freq: Once | ORAL | Status: AC
  Administered 2021-05-21: 10 mg via ORAL
  Filled 2021-05-21: qty 1

## 2021-05-21 NOTE — MAU Note (Addendum)
Patient arrived to MAU c/o pelvic pressure, braxton hicks, pounding heart with cold sweats,  headache,and back pain that started a couple of days ago. Patient denies Vaginal bleeding, leakage of fluid, positive fetal movement reported.

## 2021-05-21 NOTE — Discharge Instructions (Signed)
Infusion center [phone] Call to schedule
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
## 2021-05-22 LAB — GC/CHLAMYDIA PROBE AMP (~~LOC~~) NOT AT ARMC
Chlamydia: NEGATIVE
Comment: NEGATIVE
Comment: NORMAL
Neisseria Gonorrhea: NEGATIVE

## 2021-05-23 LAB — CULTURE, OB URINE: Culture: NO GROWTH

## 2021-05-26 ENCOUNTER — Ambulatory Visit: Payer: Medicaid Other | Admitting: *Deleted

## 2021-05-26 ENCOUNTER — Encounter: Payer: Self-pay | Admitting: *Deleted

## 2021-05-26 ENCOUNTER — Other Ambulatory Visit: Payer: Self-pay | Admitting: *Deleted

## 2021-05-26 ENCOUNTER — Other Ambulatory Visit: Payer: Self-pay

## 2021-05-26 ENCOUNTER — Ambulatory Visit: Payer: Medicaid Other | Attending: Obstetrics

## 2021-05-26 VITALS — BP 136/68 | HR 91

## 2021-05-26 DIAGNOSIS — O0990 Supervision of high risk pregnancy, unspecified, unspecified trimester: Secondary | ICD-10-CM | POA: Diagnosis present

## 2021-05-26 DIAGNOSIS — O09293 Supervision of pregnancy with other poor reproductive or obstetric history, third trimester: Secondary | ICD-10-CM | POA: Diagnosis not present

## 2021-05-26 DIAGNOSIS — O09299 Supervision of pregnancy with other poor reproductive or obstetric history, unspecified trimester: Secondary | ICD-10-CM | POA: Diagnosis not present

## 2021-05-26 DIAGNOSIS — Z3A30 30 weeks gestation of pregnancy: Secondary | ICD-10-CM | POA: Diagnosis not present

## 2021-06-03 ENCOUNTER — Ambulatory Visit (HOSPITAL_COMMUNITY)
Admission: RE | Admit: 2021-06-03 | Discharge: 2021-06-03 | Disposition: A | Discharge status: Home / Self Care | Payer: Medicaid Other | Source: Ambulatory Visit | Attending: Obstetrics and Gynecology | Admitting: Obstetrics and Gynecology

## 2021-06-03 ENCOUNTER — Other Ambulatory Visit: Payer: Self-pay

## 2021-06-03 DIAGNOSIS — O99019 Anemia complicating pregnancy, unspecified trimester: Secondary | ICD-10-CM | POA: Insufficient documentation

## 2021-06-03 MED ORDER — SODIUM CHLORIDE 0.9 % IV SOLN
500.0000 mg | Freq: Once | INTRAVENOUS | Status: AC
  Administered 2021-06-03: 500 mg via INTRAVENOUS
  Filled 2021-06-03: qty 25

## 2021-06-12 ENCOUNTER — Ambulatory Visit (INDEPENDENT_AMBULATORY_CARE_PROVIDER_SITE_OTHER): Payer: Medicaid Other

## 2021-06-12 ENCOUNTER — Other Ambulatory Visit: Payer: Self-pay

## 2021-06-12 VITALS — BP 121/76 | HR 101 | Temp 98.4°F | Wt 154.8 lb

## 2021-06-12 DIAGNOSIS — O09293 Supervision of pregnancy with other poor reproductive or obstetric history, third trimester: Secondary | ICD-10-CM

## 2021-06-12 DIAGNOSIS — O479 False labor, unspecified: Secondary | ICD-10-CM

## 2021-06-12 DIAGNOSIS — Z3A32 32 weeks gestation of pregnancy: Secondary | ICD-10-CM

## 2021-06-12 DIAGNOSIS — Z3493 Encounter for supervision of normal pregnancy, unspecified, third trimester: Secondary | ICD-10-CM
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
## 2021-06-13 ENCOUNTER — Encounter: Payer: Medicaid Other | Admitting: Certified Nurse Midwife

## 2021-06-18 ENCOUNTER — Telehealth: Payer: Self-pay | Admitting: Obstetrics and Gynecology

## 2021-06-18 ENCOUNTER — Other Ambulatory Visit: Payer: Self-pay

## 2021-06-18 ENCOUNTER — Inpatient Hospital Stay (HOSPITAL_BASED_OUTPATIENT_CLINIC_OR_DEPARTMENT_OTHER): Payer: Medicaid Other

## 2021-06-18 ENCOUNTER — Inpatient Hospital Stay (HOSPITAL_COMMUNITY)
Admission: AD | Admit: 2021-06-18 | Discharge: 2021-06-18 | Disposition: A | Discharge status: Home / Self Care | Payer: Medicaid Other | Attending: Obstetrics & Gynecology | Admitting: Obstetrics & Gynecology

## 2021-06-18 ENCOUNTER — Encounter (HOSPITAL_COMMUNITY): Payer: Self-pay | Admitting: Obstetrics & Gynecology

## 2021-06-18 DIAGNOSIS — O99891 Other specified diseases and conditions complicating pregnancy: Secondary | ICD-10-CM

## 2021-06-18 DIAGNOSIS — O26893 Other specified pregnancy related conditions, third trimester: Secondary | ICD-10-CM | POA: Diagnosis not present

## 2021-06-18 DIAGNOSIS — O4693 Antepartum hemorrhage, unspecified, third trimester: Secondary | ICD-10-CM | POA: Diagnosis not present

## 2021-06-18 DIAGNOSIS — Z3A33 33 weeks gestation of pregnancy: Secondary | ICD-10-CM

## 2021-06-18 DIAGNOSIS — N939 Abnormal uterine and vaginal bleeding, unspecified: Secondary | ICD-10-CM

## 2021-06-18 DIAGNOSIS — R102 Pelvic and perineal pain: Secondary | ICD-10-CM | POA: Diagnosis not present

## 2021-06-18 DIAGNOSIS — F17210 Nicotine dependence, cigarettes, uncomplicated: Secondary | ICD-10-CM | POA: Insufficient documentation

## 2021-06-18 DIAGNOSIS — O09293 Supervision of pregnancy with other poor reproductive or obstetric history, third trimester: Secondary | ICD-10-CM

## 2021-06-18 DIAGNOSIS — N888 Other specified noninflammatory disorders of cervix uteri: Secondary | ICD-10-CM | POA: Diagnosis not present

## 2021-06-18 DIAGNOSIS — O99333 Smoking (tobacco) complicating pregnancy, third trimester: Secondary | ICD-10-CM | POA: Insufficient documentation

## 2021-06-18 DIAGNOSIS — O26853 Spotting complicating pregnancy, third trimester: Secondary | ICD-10-CM

## 2021-06-18 LAB — URINALYSIS, ROUTINE W REFLEX MICROSCOPIC
Bacteria, UA: NONE SEEN
Bilirubin Urine: NEGATIVE
Glucose, UA: 150 mg/dL — AB
Hgb urine dipstick: NEGATIVE
Ketones, ur: 5 mg/dL — AB
Nitrite: NEGATIVE
Protein, ur: NEGATIVE mg/dL
Specific Gravity, Urine: 1.012 (ref 1.005–1.030)
pH: 6 (ref 5.0–8.0)

## 2021-06-18 LAB — WET PREP, GENITAL
Clue Cells Wet Prep HPF POC: NONE SEEN
Sperm: NONE SEEN
Trich, Wet Prep: NONE SEEN
Yeast Wet Prep HPF POC: NONE SEEN

## 2021-06-18 NOTE — MAU Provider Note (Signed)
History     CSN: [id number]  Arrival date and time: 06/18/21 1348   Event Date/Time   First Provider Initiated Contact with Patient 06/18/21 1446      Chief Complaint  Patient presents with   Vaginal Bleeding   Cramping   HPI  Ms.Veronica Moore is a 25 y.o.female [id number] @ [redacted]w[redacted]d  here in MAU with period like cramping x 2 days and bleeding. She noticed a glob of bloody discharge yesterday and light spotting throughout today.  This morning the spotting has been light pink. She has not had to wear a pad with the spotting. She currently rates her pain 0/10; the pain comes and goes. The pain feels like period cramps.  The strongest her pain got today was 5/10.   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)    Bipolar 1 disorder, depressed (HCC)    PTSD (post-traumatic stress disorder)    Suicide threat or attempt    history of SI and history of attempt 2015    Past Surgical History:  Procedure Laterality Date   NO PAST SURGERIES      Family History  Problem Relation Age of Onset   ADD / ADHD Mother    Alcohol abuse Mother    Anxiety disorder Mother    Bipolar disorder Mother    Drug abuse Mother    Seizures Mother    Sexual abuse Mother    ADD / ADHD Father    Alcohol abuse Father    Anxiety disorder Father    Bipolar disorder Father    Depression Father    Drug abuse Father    ADD / ADHD Sister    ADD / ADHD Brother    Dementia Maternal Grandmother    Seizures Cousin    Schizophrenia Cousin    ADD / ADHD Sister     Social History   Tobacco Use   Smoking status: Former    Packs/day: 1.00    Years: 8.00    Pack years: 8.00    Types: Cigarettes     Quit date: 04/24/2019    Years since quitting: 2.1   Smokeless tobacco: Never   Tobacco comments:    Now smoking 1-2 a day.  Vaping Use   Vaping Use: Never used  Substance Use Topics   Alcohol use: Not Currently    Comment: ocassionally   Drug use: Not Currently    Types: Marijuana    Comment: none with pregnancy    Allergies:  Allergies  Allergen Reactions   Mangifera Indica Anaphylaxis    Mouth bleeding   Papaya Anaphylaxis   Pineapple Swelling, Other (See Comments) and Anaphylaxis    Reaction:  Mouth/lip swelling  Mouth bleeding   Red Dye Anaphylaxis, Other (See Comments) and Diarrhea    Pt is allergic to red dye 40.    Latex Itching   Orange Oil Hives    Medications Prior  to Admission  Medication Sig Dispense Refill Last Dose   NIFEdipine (PROCARDIA) 10 MG capsule Take 1 capsule (10 mg total) by mouth 3 (three) times daily. 20 capsule 0 Past Week   Prenatal MV & Min w/FA-DHA (PRENATAL GUMMIES PO) Take by mouth.   06/18/2021   potassium chloride SA (KLOR-CON) 20 MEQ tablet Take 1 tablet (20 mEq total) by mouth 2 (two) times daily for 7 days. 14 tablet 0    Results for orders placed or performed during the hospital encounter of 06/18/21 (from the past 48 hour(s))  Urinalysis, Routine w reflex microscopic Urine, Clean Catch     Status: Abnormal   Collection Time: 06/18/21  2:03 PM  Result Value Ref Range   Color, Urine YELLOW YELLOW   APPearance HAZY (A) CLEAR   Specific Gravity, Urine 1.012 1.005 - 1.030   pH 6.0 5.0 - 8.0   Glucose, UA 150 (A) NEGATIVE mg/dL   Hgb urine dipstick NEGATIVE NEGATIVE   Bilirubin Urine NEGATIVE NEGATIVE   Ketones, ur 5 (A) NEGATIVE mg/dL   Protein, ur NEGATIVE NEGATIVE mg/dL   Nitrite NEGATIVE NEGATIVE   Leukocytes,Ua TRACE (A) NEGATIVE   RBC / HPF 0-5 0 - 5 RBC/hpf   WBC, UA 0-5 0 - 5 WBC/hpf   Bacteria, UA NONE SEEN NONE SEEN   Squamous Epithelial / LPF 6-10 0 - 5    Comment: Performed at [facility], [address]  [place], [place] [address]  Wet prep, genital     Status: Abnormal   Collection Time: 06/18/21  2:55 PM  Result Value Ref Range   Yeast Wet Prep HPF POC NONE SEEN NONE SEEN   Trich, Wet Prep NONE SEEN NONE SEEN   Clue Cells Wet Prep HPF POC NONE SEEN NONE SEEN   WBC, Wet Prep HPF POC MANY (A) NONE SEEN   Sperm NONE SEEN     Comment: Performed at [facility], [address], [place], [place] [address]    Review of Systems  Constitutional:  Negative for fever.  Gastrointestinal:  Negative for abdominal pain.  Genitourinary:  Positive for vaginal bleeding and vaginal discharge.  Physical Exam   Blood pressure 118/73, pulse (!) 112, temperature 98.2 F (36.8 C), temperature source Oral, resp. rate 18, height 5' (1.524 m), weight 70.8 kg, last menstrual period 10/28/2020, SpO2 98 %.  Physical Exam Constitutional:      General: She is not in acute distress.    Appearance: Normal appearance. She is not ill-appearing, toxic-appearing or diaphoretic.  HENT:     Head: Normocephalic.  Abdominal:     Palpations: Abdomen is soft.     Tenderness: There is no abdominal tenderness.  Genitourinary:    Comments: Vagina - Small amount of pink/white vaginal discharge, no odor  Cervix - No contact bleeding, no active bleeding  Light pink noted on the swabs when collected near cervix.  Bimanual exam: closed, thick, posterior.  GC/Chlam, wet prep done Chaperone present for exam.   Skin:    General: Skin is warm.  Neurological:     Mental Status: She is alert and oriented to person, place, and time.  Psychiatric:        Behavior: Behavior normal.   Fetal Tracing: Baseline: 135 bpm Variability: Moderate  Accelerations: 15x15 Decelerations: None Toco: None  MAU Course  Procedures None  MDM  Cervix closed GC and Wet prep collected [place] without signs of abruption. No active bleeding from cervix on exam  Reviewed patient with Dr. Charlotta Hebb.   Assessment and Plan   A:  1. Vaginal spotting    2. [redacted] weeks gestation of pregnancy   3. Friable cervix      P:  Discharge home with strict return precautions Bleeding precautions Pelvic rest If bleeding picks up return Increase oral fluid intake   Marcellis Frampton, Harolyn Rutherford, NP 06/18/2021 5:36 PM

## 2021-06-18 NOTE — Telephone Encounter (Signed)
Received a call stating she was bleeding. Informed patient she needed to go to the hospital to be evaluated per RN.

## 2021-06-18 NOTE — MAU Note (Signed)
Presents c/o VB and abdominal cramping.  Reports VB is mixed in with mucous, but "came out in a glob last night".  Denies LOF.  Endorses +FM, less than usual.

## 2021-06-19 LAB — GC/CHLAMYDIA PROBE AMP (~~LOC~~) NOT AT ARMC
Chlamydia: NEGATIVE
Comment: NEGATIVE
Comment: NORMAL
Neisseria Gonorrhea: NEGATIVE

## 2021-06-26 ENCOUNTER — Encounter: Payer: Self-pay | Admitting: Obstetrics and Gynecology

## 2021-06-26 ENCOUNTER — Telehealth (INDEPENDENT_AMBULATORY_CARE_PROVIDER_SITE_OTHER): Payer: Medicaid Other | Admitting: Obstetrics and Gynecology

## 2021-06-26 ENCOUNTER — Other Ambulatory Visit: Payer: Self-pay

## 2021-06-26 VITALS — Wt 153.8 lb

## 2021-06-26 DIAGNOSIS — Z3A34 34 weeks gestation of pregnancy: Secondary | ICD-10-CM

## 2021-06-26 DIAGNOSIS — F332 Major depressive disorder, recurrent severe without psychotic features: Secondary | ICD-10-CM

## 2021-06-26 DIAGNOSIS — O99343 Other mental disorders complicating pregnancy, third trimester: Secondary | ICD-10-CM

## 2021-06-26 DIAGNOSIS — F4310 Post-traumatic stress disorder, unspecified: Secondary | ICD-10-CM

## 2021-06-26 DIAGNOSIS — O0990 Supervision of high risk pregnancy, unspecified, unspecified trimester: Secondary | ICD-10-CM
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
## 2021-07-03 ENCOUNTER — Other Ambulatory Visit: Payer: Self-pay

## 2021-07-03 ENCOUNTER — Inpatient Hospital Stay (HOSPITAL_BASED_OUTPATIENT_CLINIC_OR_DEPARTMENT_OTHER): Payer: Medicaid Other

## 2021-07-03 ENCOUNTER — Telehealth: Payer: Self-pay | Admitting: Obstetrics and Gynecology

## 2021-07-03 ENCOUNTER — Encounter (HOSPITAL_COMMUNITY): Payer: Self-pay | Admitting: Obstetrics and Gynecology

## 2021-07-03 ENCOUNTER — Inpatient Hospital Stay (HOSPITAL_COMMUNITY)
Admission: AD | Admit: 2021-07-03 | Discharge: 2021-07-03 | Disposition: A | Discharge status: Home / Self Care | Payer: Medicaid Other | Attending: Obstetrics and Gynecology | Admitting: Obstetrics and Gynecology

## 2021-07-03 DIAGNOSIS — O09293 Supervision of pregnancy with other poor reproductive or obstetric history, third trimester: Secondary | ICD-10-CM | POA: Diagnosis not present

## 2021-07-03 DIAGNOSIS — O479 False labor, unspecified: Secondary | ICD-10-CM

## 2021-07-03 DIAGNOSIS — O26853 Spotting complicating pregnancy, third trimester: Secondary | ICD-10-CM | POA: Diagnosis not present

## 2021-07-03 DIAGNOSIS — O4703 False labor before 37 completed weeks of gestation, third trimester: Secondary | ICD-10-CM | POA: Insufficient documentation

## 2021-07-03 DIAGNOSIS — O99333 Smoking (tobacco) complicating pregnancy, third trimester: Secondary | ICD-10-CM | POA: Insufficient documentation

## 2021-07-03 DIAGNOSIS — Z3A35 35 weeks gestation of pregnancy: Secondary | ICD-10-CM

## 2021-07-03 DIAGNOSIS — F17210 Nicotine dependence, cigarettes, uncomplicated: Secondary | ICD-10-CM | POA: Insufficient documentation

## 2021-07-03 DIAGNOSIS — Z79899 Other long term (current) drug therapy: Secondary | ICD-10-CM | POA: Insufficient documentation

## 2021-07-03 DIAGNOSIS — O0990 Supervision of high risk pregnancy, unspecified, unspecified trimester: Secondary | ICD-10-CM

## 2021-07-03 DIAGNOSIS — O4693 Antepartum hemorrhage, unspecified, third trimester: Secondary | ICD-10-CM

## 2021-07-03 LAB — CBC
HCT: 33.7 % — ABNORMAL LOW (ref 36.0–46.0)
Hemoglobin: 11.2 g/dL — ABNORMAL LOW (ref 12.0–15.0)
MCH: 28.7 pg (ref 26.0–34.0)
MCHC: 33.2 g/dL (ref 30.0–36.0)
MCV: 86.4 fL (ref 80.0–100.0)
Platelets: 174 10*3/uL (ref 150–400)
RBC: 3.9 MIL/uL (ref 3.87–5.11)
RDW: 15.9 % — ABNORMAL HIGH (ref 11.5–15.5)
WBC: 5.9 10*3/uL (ref 4.0–10.5)
nRBC: 0 % (ref 0.0–0.2)

## 2021-07-03 LAB — URINALYSIS, ROUTINE W REFLEX MICROSCOPIC
Bilirubin Urine: NEGATIVE
Glucose, UA: NEGATIVE mg/dL
Ketones, ur: NEGATIVE mg/dL
Nitrite: NEGATIVE
Protein, ur: NEGATIVE mg/dL
Specific Gravity, Urine: 1.025 (ref 1.005–1.030)
pH: 5 (ref 5.0–8.0)

## 2021-07-03 LAB — WET PREP, GENITAL
Clue Cells Wet Prep HPF POC: NONE SEEN
Sperm: NONE SEEN
Trich, Wet Prep: NONE SEEN
Yeast Wet Prep HPF POC: NONE SEEN

## 2021-07-03 MED ORDER — ACETAMINOPHEN 500 MG PO TABS
1000.0000 mg | ORAL_TABLET | Freq: Once | ORAL | Status: AC
  Administered 2021-07-03: 1000 mg via ORAL
  Filled 2021-07-03: qty 2

## 2021-07-03 MED ORDER — CYCLOBENZAPRINE HCL 10 MG PO TABS: 10.0000 mg | ORAL_TABLET | Freq: Two times a day (BID) | ORAL | 0 refills | Status: DC | PRN

## 2021-07-03 MED ORDER — CYCLOBENZAPRINE HCL 5 MG PO TABS
10.0000 mg | ORAL_TABLET | Freq: Once | ORAL | Status: AC
  Administered 2021-07-03: 10 mg via ORAL
  Filled 2021-07-03: qty 2

## 2021-07-03 MED ORDER — LACTATED RINGERS IV BOLUS
1000.0000 mL | Freq: Once | INTRAVENOUS | Status: AC
  Administered 2021-07-03: 1000 mL via INTRAVENOUS

## 2021-07-03 NOTE — Telephone Encounter (Signed)
Received a call from patient stating she was bleeding. Informed her she would need to go to the hospital to be checked out. She stated she did not want to go to the hospital because the last time they just sent her home. Per Veronica Moore CNM, she was told to go to the hospital for evaluation. She stated she would go to the hospital.

## 2021-07-03 NOTE — MAU Provider Note (Signed)
History     CSN: [id number]  Arrival date and time: 07/03/21 1210   Event Date/Time   First Provider Initiated Contact with Patient 07/03/21 1425      Chief Complaint  Patient presents with   Contractions   Vaginal Bleeding   HPI Veronica Moore is a 25 y.o. O9G2952 at [redacted]w[redacted]d who presents to MAU with chief complaint of vaginal bleeding. This is a recurrent problem, current episode began earlier today. She sees blood-tinged fluid when she wipes after voiding. She has not needed to don a pad. She denies overt bright red bleeding. She is remote from sexual intercourse.  Patient also complaints of preterm contractions "like Deberah Pelton but more painful". Pain is located in her lower abdomen. Pain score is 8/10. She denies aggravating or alleviating factors. She has not taken medication or tried other treatments for this complaint.  She denies vaginal bleeding, leaking of fluid, decreased fetal movement, fever, falls, or recent illness.   Patient receives care with [facility].  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)    Bipolar 1 disorder, depressed (HCC)    PTSD (post-traumatic stress disorder)    Suicide threat or attempt    history of SI and history of attempt 2015    Past Surgical History:  Procedure Laterality Date   NO PAST SURGERIES      Family History  Problem Relation Age of Onset   ADD / ADHD Mother    Alcohol abuse Mother    Anxiety disorder Mother    Bipolar disorder Mother    Drug abuse Mother    Seizures Mother    Sexual abuse Mother    ADD / ADHD Father    Alcohol abuse Father    Anxiety disorder Father    Bipolar disorder Father     Depression Father    Drug abuse Father    ADD / ADHD Sister    ADD / ADHD Brother    Dementia Maternal Grandmother    Seizures Cousin    Schizophrenia Cousin    ADD / ADHD Sister     Social History   Tobacco Use   Smoking status: Former    Packs/day: 1.00    Years: 8.00    Pack years: 8.00    Types: Cigarettes    Quit date: 04/24/2019    Years since quitting: 2.1   Smokeless tobacco: Never   Tobacco comments:    Now smoking 1-2 a day.  Vaping Use   Vaping Use: Never used  Substance Use Topics   Alcohol use: Not Currently    Comment: ocassionally   Drug use: Not Currently    Types: Marijuana    Comment: none with pregnancy    Allergies:  Allergies  Allergen Reactions   Mangifera Indica Anaphylaxis    Mouth bleeding   Papaya Anaphylaxis   Pineapple Swelling, Other (See Comments) and Anaphylaxis    Reaction:  Mouth/lip swelling  Mouth bleeding   Red Dye Anaphylaxis, Other (See Comments) and Diarrhea    Pt is allergic to red dye 40.    Latex Itching   Orange Oil Hives    Medications Prior to Admission  Medication Sig Dispense Refill Last Dose   NIFEdipine (PROCARDIA) 10 MG capsule Take 1 capsule (10 mg total) by mouth 3 (three) times daily. 20 capsule 0    potassium chloride SA (KLOR-CON) 20 MEQ tablet Take 1 tablet (20 mEq total) by mouth 2 (two) times daily for 7 days. 14 tablet 0    Prenatal MV & Min w/FA-DHA (PRENATAL GUMMIES PO) Take by mouth.       Review of Systems  Gastrointestinal:  Positive for abdominal pain.  Genitourinary:  Positive for vaginal bleeding.  All other systems reviewed and are negative. Physical Exam   Blood pressure 114/69, pulse 64, temperature 98.7 F (37.1 C), temperature source Oral, resp. rate 18, height 5' (1.524 m), weight 70.3 kg, last menstrual period 10/28/2020, SpO2 99 %.  Physical Exam Vitals and nursing note reviewed. Exam conducted with a chaperone present.  Constitutional:      Appearance: Normal appearance.   Cardiovascular:     Rate and Rhythm: Normal rate and regular rhythm.     Pulses: Normal pulses.     Heart sounds: Normal heart sounds.  Pulmonary:     Effort: Pulmonary effort is normal.     Breath sounds: Normal breath sounds.  Abdominal:     Comments: Gravid  Genitourinary:    Comments: Pelvic exam: External genitalia normal, vaginal walls pink and well rugated, cervix visually closed, no lesions noted. Negligible light brown tinged discharge on speculum exam. Scant amount of dark red discharge noted on end of speculum at conclusion of exam   Skin:    Capillary Refill: Capillary refill takes less than 2 seconds.  Neurological:     Mental Status: She is alert and oriented to person, place, and time.  Psychiatric:        Mood and Affect: Mood normal.        Behavior: Behavior normal.        Thought Content: Thought content normal.    MAU Course  Procedures  --Abnormal UA. Presence of squam epithelial cells suggesting contaminated catch. Insufficient urine for culture. Will continue to monitor in office. --Reactive tracing: baseline 120, mod var, + accels, no decels --Toco: irregular contractions, palpate mild, pain score improving over time --Cervix remains closed/thick/posterior 3 hours after initial assessment --FFN not collected due to gestation age of 30+3  Orders Placed This Encounter  Procedures   Wet prep, genital   [place] MFM OB Limited   Urinalysis, Routine w reflex microscopic Urine, Clean Catch   CBC   Insert peripheral IV   Discharge patient   Patient Vitals for the past 24 hrs:  BP Temp Temp src Pulse Resp SpO2 Height Weight  07/03/21 1555 114/69 -- -- 64 -- -- -- --  07/03/21 1242 119/68 -- -- 85 -- -- -- --  07/03/21 1225 122/69 98.7 F (37.1 C) Oral 85 18 99 % -- --  07/03/21 1219 -- -- -- -- -- -- 5' (1.524 m) 70.3 kg   Results for orders placed or performed during the hospital encounter of 07/03/21 (from the past 24 hour(s))  Urinalysis, Routine w  reflex microscopic Urine, Clean Catch     Status: Abnormal   Collection Time: 07/03/21 12:30 PM  Result Value Ref Range  Color, Urine YELLOW YELLOW   APPearance HAZY (A) CLEAR   Specific Gravity, Urine 1.025 1.005 - 1.030   pH 5.0 5.0 - 8.0   Glucose, UA NEGATIVE NEGATIVE mg/dL   Hgb urine dipstick LARGE (A) NEGATIVE   Bilirubin Urine NEGATIVE NEGATIVE   Ketones, ur NEGATIVE NEGATIVE mg/dL   Protein, ur NEGATIVE NEGATIVE mg/dL   Nitrite NEGATIVE NEGATIVE   Leukocytes,Ua MODERATE (A) NEGATIVE   RBC / HPF 21-50 0 - 5 RBC/hpf   WBC, UA 0-5 0 - 5 WBC/hpf   Bacteria, UA RARE (A) NONE SEEN   Squamous Epithelial / LPF 6-10 0 - 5   Mucus PRESENT   CBC     Status: Abnormal   Collection Time: 07/03/21 12:52 PM  Result Value Ref Range   WBC 5.9 4.0 - 10.5 K/uL   RBC 3.90 3.87 - 5.11 MIL/uL   Hemoglobin 11.2 (L) 12.0 - 15.0 g/dL   HCT 11.9 (L) 41.7 - 40.8 %   MCV 86.4 80.0 - 100.0 fL   MCH 28.7 26.0 - 34.0 pg   MCHC 33.2 30.0 - 36.0 g/dL   RDW 14.4 (H) 81.8 - 56.3 %   Platelets 174 150 - 400 K/uL   nRBC 0.0 0.0 - 0.2 %  Wet prep, genital     Status: Abnormal   Collection Time: 07/03/21  1:00 PM   Specimen: PATH Cytology Cervicovaginal Ancillary Only  Result Value Ref Range   Yeast Wet Prep HPF POC NONE SEEN NONE SEEN   Trich, Wet Prep NONE SEEN NONE SEEN   Clue Cells Wet Prep HPF POC NONE SEEN NONE SEEN   WBC, Wet Prep HPF POC MANY (A) NONE SEEN   Sperm NONE SEEN    [place] MFM OB Limited  Result Date: 07/03/2021 ----------------------------------------------------------------------  OBSTETRICS REPORT                       (Signed Final 07/03/2021 05:04 pm) ---------------------------------------------------------------------- Patient Info  ID #:       [id number]                          D.O.B.:  [birth year] (24 yrs)  Name:       Veronica Moore                   Visit Date: 07/03/2021 01:11 pm ---------------------------------------------------------------------- Performed By  Attending:         Ma Rings MD         Ref. Address:     [address]                                                             [place], [place]  [address]  Performed By:     Reinaldo Raddle            [facility]  Referred By:      Raelyn Mora CNM ---------------------------------------------------------------------- Orders  #  Description                           Code        Ordered By  1  [place] MFM OB LIMITED                     74128.78    Clayton Bibles ----------------------------------------------------------------------  #  Order #                     Accession #                Episode #  1  [id number]                   [id number]                 [id number] ---------------------------------------------------------------------- Indications  Vaginal bleeding in pregnancy, third trimester O46.93  Poor obstetric history: Prior fetal            O09.299  macrosomia, antepartum(Brachial Plexus  Injury)  Low Risk NIPS(Negative Horizon 4/4)  [redacted] weeks gestation of pregnancy                Z3A.35 ---------------------------------------------------------------------- Vital Signs                                                 Height:        5' ---------------------------------------------------------------------- Fetal Evaluation  Num Of Fetuses:         1  Fetal Heart Rate(bpm):  129  Cardiac Activity:       Observed  Presentation:           Cephalic  Placenta:               Anterior  P. Cord Insertion:      Visualized, central  Amniotic Fluid  AFI FV:      Within normal limits  AFI Sum(cm)     %Tile       Largest Pocket(cm)  12.5            40          7.1  RUQ(cm)       RLQ(cm)       LUQ(cm)  LLQ(cm)  7.1           3.6           0               1.8  Comment:    No placental abruption identified. ---------------------------------------------------------------------- Biometry  LV:        4.5  mm ---------------------------------------------------------------------- OB History  Gravidity:    4         Term:   1         SAB:   1  Ectopic:      1 ---------------------------------------------------------------------- Gestational Age  LMP:           35w 3d        Date:  10/28/20                 EDD:   08/04/21  Best:          Consuello Closs35w 3d     Det. By:  LMP  (10/28/20)          EDD:   08/04/21 ---------------------------------------------------------------------- Anatomy  Ventricles:            Appears normal         Kidneys:                Appear normal  Diaphragm:             Appears normal         Bladder:                Appears normal  Stomach:               Appears normal, left                         sided ---------------------------------------------------------------------- Cervix Uterus Adnexa  Cervix  Not visualized (advanced GA >24wks) ---------------------------------------------------------------------- Comments  This patient presented to the MAU due to vaginal bleeding.  A limited ultrasound performed today shows that the fetus is  in the vertex presentation.  There was normal amniotic fluid noted.  A normal-appearing anterior placenta is noted today.  There  were no signs of placenta previa noted. ----------------------------------------------------------------------                   Ma RingsVictor Fang, MD Electronically Signed Final Report   07/03/2021 05:04 pm ----------------------------------------------------------------------   Assessment and Plan  --25 y.o. G4P1021 at 6382w3d  --Reactive tracing, closed cervix --Braxton Hicks contractions --Recurrent vaginal spotting --Hgb 11.2, blood type B POS --Pelvic rest advised --Patient given work restrictions letter --Discharge home in stable condition  Calvert CantorSamantha C Oni Dietzman, CNM 07/03/2021, 7:40 PM

## 2021-07-03 NOTE — MAU Note (Signed)
Presents c/o VB and ctxs.  Reports ctxs began last night and have been consistent.  States VB also began last night, but not heavy (not wearing sanitary napkin/panty liner).  Endorses +FM.  Denies LOF.

## 2021-07-04 LAB — GC/CHLAMYDIA PROBE AMP (~~LOC~~) NOT AT ARMC
Chlamydia: NEGATIVE
Comment: NEGATIVE
Comment: NORMAL
Neisseria Gonorrhea: NEGATIVE

## 2021-07-10 ENCOUNTER — Ambulatory Visit (INDEPENDENT_AMBULATORY_CARE_PROVIDER_SITE_OTHER): Payer: Medicaid Other | Admitting: Obstetrics and Gynecology

## 2021-07-10 ENCOUNTER — Other Ambulatory Visit (HOSPITAL_COMMUNITY)
Admission: RE | Admit: 2021-07-10 | Discharge: 2021-07-10 | Disposition: A | Discharge status: Home / Self Care | Payer: Medicaid Other | Source: Ambulatory Visit | Attending: Obstetrics and Gynecology | Admitting: Obstetrics and Gynecology

## 2021-07-10 ENCOUNTER — Other Ambulatory Visit: Payer: Self-pay

## 2021-07-10 VITALS — BP 129/81 | HR 98 | Temp 98.0°F | Wt 159.2 lb

## 2021-07-10 DIAGNOSIS — O0990 Supervision of high risk pregnancy, unspecified, unspecified trimester: Secondary | ICD-10-CM | POA: Diagnosis not present

## 2021-07-10 DIAGNOSIS — Z3A36 36 weeks gestation of pregnancy: Secondary | ICD-10-CM

## 2021-07-10 DIAGNOSIS — B354 Tinea corporis: Secondary | ICD-10-CM

## 2021-07-10 MED ORDER — TERBINAFINE HCL 1 % EX CREA: 1.0000 "application " | TOPICAL_CREAM | Freq: Two times a day (BID) | CUTANEOUS | 0 refills | Status: DC

## 2021-07-10 NOTE — Progress Notes (Addendum)
   LOW-RISK PREGNANCY OFFICE VISIT Patient name: Veronica Moore MRN [id number]  Date of birth: 1996-09-26 Chief Complaint:   Routine Prenatal Visit  History of Present Illness:   Veronica Moore is a 25 y.o. G10P1021 female at [redacted]w[redacted]d with an Estimated Date of Delivery: 08/04/21 being seen today for ongoing management of a low-risk pregnancy.  Today she reports  ?ringworm on RT inner thigh . Contractions: Irregular. Vag. Bleeding: None.  Movement: Present. denies leaking of fluid. Review of Systems:   Pertinent items are noted in HPI Denies abnormal vaginal discharge w/ itching/odor/irritation, headaches, visual changes, shortness of breath, chest pain, abdominal pain, severe nausea/vomiting, or problems with urination or bowel movements unless otherwise stated above. Pertinent History Reviewed:  Reviewed past medical,surgical, social, obstetrical and family history.  Reviewed problem list, medications and allergies. Physical Assessment:   Vitals:   07/10/21 1516  BP: 129/81  Pulse: 98  Temp: 98 F (36.7 C)  Weight: 159 lb 3.2 oz (72.2 kg)  Body mass index is 31.09 kg/m.        Physical Examination:   General appearance: Well appearing, and in no distress  Mental status: Alert, oriented to person, place, and time  Skin: Warm & dry - 1cm x 1cm round, erythematous, raised, scaly ring with central clearing   Cardiovascular: Normal heart rate noted  Respiratory: Normal respiratory effort, no distress  Abdomen: Soft, gravid, nontender  Pelvic: Cervical exam deferred         Extremities: Edema: None      Fetal Status: Fetal Heart Rate (bpm): 140 Fundal Height: 41 cm Movement: Present Presentation: Vertex  No results found for this or any previous visit (from the past 24 hour(s)).  Assessment & Plan:  1) Low-risk pregnancy G4P1021 at [redacted]w[redacted]d with an Estimated Date of Delivery: 08/04/21   2) Supervision of high risk pregnancy, antepartum  - Cervicovaginal ancillary only( [place]),  -  Culture, beta strep (group b only)  3) Ringworm of body - inner RT thigh  - Rx for terbinafine (LAMISIL) 1 % cream BID - Advised to keep open to air to ensure healing of the lesion  4) [redacted] weeks gestation of pregnancy   Meds:  Meds ordered this encounter  Medications   terbinafine (LAMISIL) 1 % cream    Sig: Apply 1 application topically 2 (two) times daily.    Dispense:  30 g    Refill:  0    Order Specific Question:   Supervising Provider    Answer:   Reva Bores [[id number]]   Labs/procedures today: GBS, GC/CT  Plan:  Continue routine obstetrical care   Reviewed: Preterm labor symptoms and general obstetric precautions including but not limited to vaginal bleeding, contractions, leaking of fluid and fetal movement were reviewed in detail with the patient.  All questions were answered. Has home bp cuff. Check bp weekly, let us know if >140/90.   Follow-up: Return in about 1 week (around 07/17/2021) for Return OB visit.  Orders Placed This Encounter  Procedures   Culture, beta strep (group b only)   Raelyn Mora MSN, CNM 07/10/2021 4:05 PM

## 2021-07-11 ENCOUNTER — Encounter: Payer: Self-pay | Admitting: *Deleted

## 2021-07-11 ENCOUNTER — Telehealth: Payer: Self-pay | Admitting: *Deleted

## 2021-07-11 LAB — CERVICOVAGINAL ANCILLARY ONLY
Bacterial Vaginitis (gardnerella): NEGATIVE
Candida Glabrata: NEGATIVE
Candida Vaginitis: POSITIVE — AB
Chlamydia: NEGATIVE
Comment: NEGATIVE
Comment: NEGATIVE
Comment: NEGATIVE
Comment: NEGATIVE
Comment: NEGATIVE
Comment: NORMAL
Neisseria Gonorrhea: NEGATIVE
Trichomonas: NEGATIVE

## 2021-07-11 NOTE — Telephone Encounter (Signed)
Call to patient.Left message procedure scheduled for 07-28-21 at 1pm, arrive 11am. Left message will receive call with additional instructions and letter in mail/MyChart.  Encounter closed.

## 2021-07-12 ENCOUNTER — Other Ambulatory Visit (INDEPENDENT_AMBULATORY_CARE_PROVIDER_SITE_OTHER): Payer: Medicaid Other | Admitting: Obstetrics and Gynecology

## 2021-07-12 DIAGNOSIS — B373 Candidiasis of vulva and vagina: Secondary | ICD-10-CM

## 2021-07-12 DIAGNOSIS — B3731 Acute candidiasis of vulva and vagina: Secondary | ICD-10-CM

## 2021-07-12 MED ORDER — TERCONAZOLE 0.4 % VA CREA
1.0000 | TOPICAL_CREAM | Freq: Every day | VAGINAL | 0 refills | Status: DC
Start: 2021-07-12 — End: 2021-07-17

## 2021-07-14 ENCOUNTER — Ambulatory Visit: Payer: Medicaid Other | Attending: Obstetrics

## 2021-07-14 ENCOUNTER — Other Ambulatory Visit: Payer: Self-pay

## 2021-07-14 ENCOUNTER — Ambulatory Visit: Payer: Medicaid Other | Admitting: *Deleted

## 2021-07-14 ENCOUNTER — Encounter: Payer: Self-pay | Admitting: Obstetrics and Gynecology

## 2021-07-14 VITALS — BP 122/78 | HR 79

## 2021-07-14 DIAGNOSIS — Z3A37 37 weeks gestation of pregnancy: Secondary | ICD-10-CM

## 2021-07-14 DIAGNOSIS — O0990 Supervision of high risk pregnancy, unspecified, unspecified trimester: Secondary | ICD-10-CM

## 2021-07-14 DIAGNOSIS — O4693 Antepartum hemorrhage, unspecified, third trimester: Secondary | ICD-10-CM | POA: Diagnosis not present

## 2021-07-14 DIAGNOSIS — O09293 Supervision of pregnancy with other poor reproductive or obstetric history, third trimester: Secondary | ICD-10-CM

## 2021-07-14 DIAGNOSIS — O09299 Supervision of pregnancy with other poor reproductive or obstetric history, unspecified trimester: Secondary | ICD-10-CM | POA: Diagnosis not present

## 2021-07-14 LAB — CULTURE, BETA STREP (GROUP B ONLY): Strep Gp B Culture: NEGATIVE

## 2021-07-15 NOTE — Patient Instructions (Signed)
Veronica Moore  07/15/2021   Your procedure is scheduled on:  07/28/2021  Arrive at 1100 at Entrance C on [address] at [facility]  and [employer]. You are invited to use the FREE valet parking or use the Visitor's parking deck.  Pick up the phone at the desk and dial [phone].  Call this number if you have problems the morning of surgery: [phone]  Remember:   Do not eat food:(After Midnight) Desps de medianoche.  Do not drink clear liquids: (After Midnight) Desps de medianoche.  Take these medicines the morning of surgery with A SIP OF WATER:  none   Do not wear jewelry, make-up or nail polish.  Do not wear lotions, powders, or perfumes. Do not wear deodorant.  Do not shave 48 hours prior to surgery.  Do not bring valuables to the hospital.  [facility] is not   responsible for any belongings or valuables brought to the hospital.  Contacts, dentures or bridgework may not be worn into surgery.  Leave suitcase in the car. After surgery it may be brought to your room.  For patients admitted to the hospital, checkout time is 11:00 AM the day of              discharge.      Please read over the following fact sheets that you were given:     Preparing for Surgery

## 2021-07-16 ENCOUNTER — Telehealth (HOSPITAL_COMMUNITY): Payer: Self-pay | Admitting: *Deleted

## 2021-07-16 NOTE — Telephone Encounter (Signed)
Preadmission screen  

## 2021-07-17 ENCOUNTER — Encounter: Payer: Medicaid Other | Admitting: Obstetrics and Gynecology

## 2021-07-17 ENCOUNTER — Inpatient Hospital Stay (HOSPITAL_COMMUNITY)
Admission: AD | Admit: 2021-07-17 | Discharge: 2021-07-17 | Disposition: A | Discharge status: Home / Self Care | Payer: Medicaid Other | Attending: Obstetrics and Gynecology | Admitting: Obstetrics and Gynecology

## 2021-07-17 ENCOUNTER — Other Ambulatory Visit: Payer: Self-pay

## 2021-07-17 ENCOUNTER — Telehealth (HOSPITAL_COMMUNITY): Payer: Self-pay | Admitting: *Deleted

## 2021-07-17 DIAGNOSIS — O9A213 Injury, poisoning and certain other consequences of external causes complicating pregnancy, third trimester: Secondary | ICD-10-CM

## 2021-07-17 DIAGNOSIS — S61259A Open bite of unspecified finger without damage to nail, initial encounter: Secondary | ICD-10-CM

## 2021-07-17 DIAGNOSIS — Y92009 Unspecified place in unspecified non-institutional (private) residence as the place of occurrence of the external cause: Secondary | ICD-10-CM | POA: Diagnosis not present

## 2021-07-17 DIAGNOSIS — O0990 Supervision of high risk pregnancy, unspecified, unspecified trimester: Secondary | ICD-10-CM

## 2021-07-17 DIAGNOSIS — O26893 Other specified pregnancy related conditions, third trimester: Secondary | ICD-10-CM | POA: Diagnosis not present

## 2021-07-17 DIAGNOSIS — Z3A37 37 weeks gestation of pregnancy: Secondary | ICD-10-CM | POA: Insufficient documentation

## 2021-07-17 DIAGNOSIS — W5501XA Bitten by cat, initial encounter: Secondary | ICD-10-CM | POA: Insufficient documentation

## 2021-07-17 DIAGNOSIS — Z87891 Personal history of nicotine dependence: Secondary | ICD-10-CM | POA: Insufficient documentation

## 2021-07-17 MED ORDER — AMOXICILLIN-POT CLAVULANATE 875-125 MG PO TABS: 1.0000 | ORAL_TABLET | Freq: Two times a day (BID) | ORAL | 0 refills | Status: AC

## 2021-07-17 NOTE — MAU Note (Signed)
Presents c/o of an animal bite on both hands.  Reports cat bit both hands today while @ work.  States skin punctured on left thumb and right middle finger. Denies pregnancy related issues.

## 2021-07-17 NOTE — MAU Provider Note (Signed)
History     CSN: [id number]  Arrival date and time: 07/17/21 1410   Event Date/Time   First Provider Initiated Contact with Patient 07/17/21 1446      Chief Complaint  Patient presents with   Animal Bite   Veronica Moore is a 25 year old G4P1021 at [redacted]w[redacted]d presenting for evaluation of cat bites.  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  Around 11:30 AM today, the Cat bit into her left thumb and middle right finger while she was trimming it's nails.  Already thoroughly clean the areas with soap and water and placed a Band-Aid on the areas with antibiotic ointment.  They have had this cat since 35 weeks of age and has been doing well. The kitten lives indoors strictly and stays in a large cage all day as to stay away from the remainder of their animals as the cat currently has a ringworm/stool infection.  The kitten is too young to receive its rabies vaccine yet.  She is up-to-date on her Tdap, received in 04/2021.  She otherwise feels well. She currently denies any abdominal pain, vaginal bleeding, leakage of fluid, or decreased fetal movement.  Scheduled for C-section at 39 weeks.   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)    Bipolar 1 disorder, depressed (HCC)    PTSD (post-traumatic stress disorder)    Suicide threat or attempt    history of SI and history of attempt 2015    Past Surgical History:  Procedure Laterality Date   NO PAST SURGERIES      Family History  Problem Relation Age of Onset   ADD / ADHD Mother    Alcohol abuse Mother    Anxiety disorder Mother    Bipolar disorder Mother    Drug abuse Mother    Seizures Mother    Sexual abuse Mother    ADD / ADHD Father    Alcohol abuse Father    Anxiety disorder Father    Bipolar disorder Father    Depression  Father    Drug abuse Father    ADD / ADHD Sister    ADD / ADHD Brother    Dementia Maternal Grandmother    Seizures Cousin    Schizophrenia Cousin    ADD / ADHD Sister     Social History   Tobacco Use   Smoking status: Former    Packs/day: 1.00    Years: 8.00    Pack years: 8.00    Types: Cigarettes    Quit date: 04/24/2019    Years since quitting: 2.2   Smokeless tobacco: Never   Tobacco comments:    Now smoking 1-2 a day.  Vaping Use   Vaping Use: Never used  Substance Use Topics   Alcohol use: Not Currently    Comment: ocassionally   Drug use: Not Currently    Types: Marijuana    Comment: none with pregnancy    Allergies:  Allergies  Allergen Reactions   Mangifera Indica Anaphylaxis    Mouth bleeding   Papaya Anaphylaxis   Pineapple Swelling, Other (See Comments) and Anaphylaxis    Reaction:  Mouth/lip swelling  Mouth bleeding   Red Dye Anaphylaxis, Other (See Comments) and Diarrhea    Pt is allergic to red dye 40.  Latex Itching   Orange Oil Hives    Medications Prior to Admission  Medication Sig Dispense Refill Last Dose   cyclobenzaprine (FLEXERIL) 10 MG tablet Take 1 tablet (10 mg total) by mouth 2 (two) times daily as needed for muscle spasms. 20 tablet 0    NIFEdipine (PROCARDIA) 10 MG capsule Take 1 capsule (10 mg total) by mouth 3 (three) times daily. (Patient not taking: Reported on 07/14/2021) 20 capsule 0    potassium chloride SA (KLOR-CON) 20 MEQ tablet Take 1 tablet (20 mEq total) by mouth 2 (two) times daily for 7 days. 14 tablet 0    Prenatal MV & Min w/FA-DHA (PRENATAL GUMMIES PO) Take by mouth.      terbinafine (LAMISIL) 1 % cream Apply 1 application topically 2 (two) times daily. 30 g 0    terconazole (TERAZOL 7) 0.4 % vaginal cream Place 1 applicator vaginally at bedtime for 7 days. (Patient not taking: Reported on 07/14/2021) 45 g 0     Review of Systems  Constitutional:  Negative for fatigue and fever.  HENT:  Negative for congestion.    Gastrointestinal:  Negative for abdominal pain.  Genitourinary:  Negative for dysuria and vaginal bleeding.  Musculoskeletal:  Negative for joint swelling.  Skin:  Positive for wound.  Neurological:  Negative for dizziness and light-headedness.  Psychiatric/Behavioral:  Negative for behavioral problems and confusion.   Physical Exam   Blood pressure 120/75, pulse (!) 103, temperature 97.9 F (36.6 C), temperature source Oral, resp. rate 20, last menstrual period 10/28/2020, SpO2 98 %.  Physical Exam Constitutional:      General: She is not in acute distress.    Appearance: Normal appearance. She is not ill-appearing or toxic-appearing.  HENT:     Head: Normocephalic and atraumatic.  Eyes:     Extraocular Movements: Extraocular movements intact.  Pulmonary:     Effort: Pulmonary effort is normal.  Abdominal:     Comments: Gravid uterus   Musculoskeletal:     Cervical back: Neck supple.  Skin:    General: Skin is warm and dry.     Capillary Refill: Capillary refill takes less than 2 seconds.     Findings: No rash.     Comments: See pictures below.  Very small puncture wounds present on left thumb and right middle finger (2 on middle finger, 1 on left thumb).  No active bleeding.  No surrounding erythema, swelling, or puslike drainage.  Neurological:     General: No focal deficit present.     Mental Status: She is alert.  Psychiatric:        Mood and Affect: Mood normal.        Behavior: Behavior normal.   FHT: 145 bpm       Assessment and Plan   25 yo female, at [redacted]w[redacted]d, presenting with acute cat puncture bites of 2 fingers.  No evidence of infection or active bleeding, small regions pictured above.  UTD on Tdap.  The kitten is still too young to have received the rabies vaccine, however has been in close observation (indoors only) for several weeks without suspicious behavior, with today's incident considered provoked.  Low risk for rabies, cat available to quarantine for  the next 10 days.    Augmentin x5 days sent in for prophylaxis given location and puncture wound.  If any suspicious animal behavior, return immediately for evaluation.  Educated on s/sx of skin infection to monitor for, keep area clean and dry.  Discharged home in  stable condition.  Allayne Stack 07/17/2021, 3:14 PM

## 2021-07-17 NOTE — Telephone Encounter (Signed)
Preadmission screen  

## 2021-07-17 NOTE — Discharge Instructions (Addendum)
It was wonderful to see you today.  Please monitor the cat for the next 10 days.  If any concern for rabies, please be reevaluated immediately.  Monitor your fingers for any signs of infection.  If you have any fever (>100.57F), warmth to touch increasing on the skin, redness spreading around the area, puslike drainage--please be evaluated. Take Augmentin for the next 5 days. May cause some loose bowel movements.   Keep your fingers clean and dry.  You can continue to bandage them as you have been.

## 2021-07-18 ENCOUNTER — Telehealth (HOSPITAL_COMMUNITY): Payer: Self-pay | Admitting: *Deleted

## 2021-07-18 NOTE — Telephone Encounter (Signed)
Preadmission screen  

## 2021-07-22 ENCOUNTER — Encounter (HOSPITAL_COMMUNITY): Payer: Self-pay

## 2021-07-22 ENCOUNTER — Telehealth (INDEPENDENT_AMBULATORY_CARE_PROVIDER_SITE_OTHER): Payer: Medicaid Other | Admitting: Family Medicine

## 2021-07-22 DIAGNOSIS — Z8759 Personal history of other complications of pregnancy, childbirth and the puerperium: Secondary | ICD-10-CM | POA: Insufficient documentation

## 2021-07-22 DIAGNOSIS — O0990 Supervision of high risk pregnancy, unspecified, unspecified trimester: Secondary | ICD-10-CM

## 2021-07-22 DIAGNOSIS — O9A313 Physical abuse complicating pregnancy, third trimester: Secondary | ICD-10-CM

## 2021-07-22 DIAGNOSIS — T7491XD Unspecified adult maltreatment, confirmed, subsequent encounter: Secondary | ICD-10-CM

## 2021-07-22 DIAGNOSIS — Z3A38 38 weeks gestation of pregnancy: Secondary | ICD-10-CM
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
## 2021-07-23 ENCOUNTER — Encounter (HOSPITAL_COMMUNITY): Payer: Self-pay

## 2021-07-24 ENCOUNTER — Encounter: Payer: Medicaid Other | Admitting: Obstetrics and Gynecology

## 2021-07-25 ENCOUNTER — Encounter (HOSPITAL_COMMUNITY)
Admission: RE | Admit: 2021-07-25 | Discharge: 2021-07-25 | Disposition: A | Discharge status: Home / Self Care | Payer: Medicaid Other | Source: Ambulatory Visit | Attending: Obstetrics and Gynecology | Admitting: Obstetrics and Gynecology

## 2021-07-25 ENCOUNTER — Other Ambulatory Visit: Payer: Self-pay | Admitting: Family Medicine

## 2021-07-25 ENCOUNTER — Other Ambulatory Visit (HOSPITAL_COMMUNITY)
Admission: RE | Admit: 2021-07-25 | Discharge: 2021-07-25 | Disposition: A | Discharge status: Home / Self Care | Payer: Medicaid Other | Source: Ambulatory Visit | Attending: Obstetrics and Gynecology | Admitting: Obstetrics and Gynecology

## 2021-07-25 ENCOUNTER — Other Ambulatory Visit: Payer: Self-pay

## 2021-07-25 HISTORY — DX: Supervision of pregnancy with other poor reproductive or obstetric history, unspecified trimester: O09.299

## 2021-07-25 LAB — TYPE AND SCREEN
ABO/RH(D): B POS
Antibody Screen: NEGATIVE

## 2021-07-26 LAB — SARS CORONAVIRUS 2 (TAT 6-24 HRS): SARS Coronavirus 2: NEGATIVE
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
## 2021-07-28 ENCOUNTER — Inpatient Hospital Stay (HOSPITAL_COMMUNITY): Payer: Medicaid Other | Admitting: Anesthesiology

## 2021-07-28 ENCOUNTER — Encounter (HOSPITAL_COMMUNITY): Payer: Self-pay | Admitting: Family Medicine

## 2021-07-28 ENCOUNTER — Inpatient Hospital Stay (HOSPITAL_COMMUNITY)
Admission: RE | Admit: 2021-07-28 | Discharge: 2021-07-31 | DRG: 788 | Disposition: A | Discharge status: Home / Self Care | Payer: Medicaid Other | Attending: Family Medicine | Admitting: Family Medicine

## 2021-07-28 ENCOUNTER — Encounter (HOSPITAL_COMMUNITY)
Admission: RE | Disposition: A | Discharge status: Home / Self Care | Payer: Self-pay | Source: Home / Self Care | Attending: Family Medicine

## 2021-07-28 ENCOUNTER — Other Ambulatory Visit: Payer: Self-pay

## 2021-07-28 DIAGNOSIS — F4310 Post-traumatic stress disorder, unspecified: Secondary | ICD-10-CM | POA: Diagnosis present

## 2021-07-28 DIAGNOSIS — O34211 Maternal care for low transverse scar from previous cesarean delivery: Secondary | ICD-10-CM | POA: Diagnosis not present

## 2021-07-28 DIAGNOSIS — Z30430 Encounter for insertion of intrauterine contraceptive device: Secondary | ICD-10-CM

## 2021-07-28 DIAGNOSIS — T7491XA Unspecified adult maltreatment, confirmed, initial encounter: Secondary | ICD-10-CM | POA: Diagnosis present

## 2021-07-28 DIAGNOSIS — Z3A38 38 weeks gestation of pregnancy: Secondary | ICD-10-CM

## 2021-07-28 DIAGNOSIS — Z975 Presence of (intrauterine) contraceptive device: Secondary | ICD-10-CM

## 2021-07-28 DIAGNOSIS — A6000 Herpesviral infection of urogenital system, unspecified: Secondary | ICD-10-CM | POA: Diagnosis present

## 2021-07-28 DIAGNOSIS — O99214 Obesity complicating childbirth: Secondary | ICD-10-CM | POA: Diagnosis present

## 2021-07-28 DIAGNOSIS — F332 Major depressive disorder, recurrent severe without psychotic features: Secondary | ICD-10-CM | POA: Diagnosis present

## 2021-07-28 DIAGNOSIS — Z8759 Personal history of other complications of pregnancy, childbirth and the puerperium: Secondary | ICD-10-CM

## 2021-07-28 DIAGNOSIS — O26893 Other specified pregnancy related conditions, third trimester: Secondary | ICD-10-CM | POA: Diagnosis present

## 2021-07-28 DIAGNOSIS — Z3A39 39 weeks gestation of pregnancy: Secondary | ICD-10-CM | POA: Diagnosis not present

## 2021-07-28 DIAGNOSIS — Z87891 Personal history of nicotine dependence: Secondary | ICD-10-CM

## 2021-07-28 LAB — CBC
HCT: 35.2 % — ABNORMAL LOW (ref 36.0–46.0)
Hemoglobin: 11.4 g/dL — ABNORMAL LOW (ref 12.0–15.0)
MCH: 27.5 pg (ref 26.0–34.0)
MCHC: 32.4 g/dL (ref 30.0–36.0)
MCV: 85 fL (ref 80.0–100.0)
Platelets: 183 10*3/uL (ref 150–400)
RBC: 4.14 MIL/uL (ref 3.87–5.11)
RDW: 15.3 % (ref 11.5–15.5)
WBC: 6.6 10*3/uL (ref 4.0–10.5)
nRBC: 0 % (ref 0.0–0.2)

## 2021-07-28 SURGERY — Surgical Case
Anesthesia: Spinal

## 2021-07-28 MED ORDER — LEVONORGESTREL 20.1 MCG/DAY IU IUD
INTRAUTERINE_SYSTEM | INTRAUTERINE | Status: AC
  Filled 2021-07-28: qty 1

## 2021-07-28 MED ORDER — LACTATED RINGERS IV SOLN: INTRAVENOUS | Status: DC

## 2021-07-28 MED ORDER — POVIDONE-IODINE 10 % EX SWAB: 2.0000 "application " | Freq: Once | CUTANEOUS | Status: DC

## 2021-07-28 MED ORDER — SIMETHICONE 80 MG PO CHEW: 80.0000 mg | CHEWABLE_TABLET | ORAL | Status: DC | PRN

## 2021-07-28 MED ORDER — DEXAMETHASONE SODIUM PHOSPHATE 10 MG/ML IJ SOLN
INTRAMUSCULAR | Status: AC
  Filled 2021-07-28: qty 1

## 2021-07-28 MED ORDER — ACETAMINOPHEN 500 MG PO TABS
1000.0000 mg | ORAL_TABLET | Freq: Four times a day (QID) | ORAL | Status: DC
  Administered 2021-07-28 – 2021-07-31 (×12): 1000 mg via ORAL
  Filled 2021-07-28 (×12): qty 2

## 2021-07-28 MED ORDER — MEDROXYPROGESTERONE ACETATE 150 MG/ML IM SUSP: 150.0000 mg | INTRAMUSCULAR | Status: DC | PRN

## 2021-07-28 MED ORDER — DIPHENHYDRAMINE HCL 50 MG/ML IJ SOLN: 12.5000 mg | INTRAMUSCULAR | Status: DC | PRN

## 2021-07-28 MED ORDER — SOD CITRATE-CITRIC ACID 500-334 MG/5ML PO SOLN
ORAL | Status: AC
  Filled 2021-07-28: qty 30

## 2021-07-28 MED ORDER — IBUPROFEN 600 MG PO TABS
600.0000 mg | ORAL_TABLET | Freq: Four times a day (QID) | ORAL | Status: DC
  Administered 2021-07-29 – 2021-07-30 (×3): 600 mg via ORAL
  Filled 2021-07-28 (×4): qty 1

## 2021-07-28 MED ORDER — FENTANYL CITRATE (PF) 100 MCG/2ML IJ SOLN: 25.0000 ug | INTRAMUSCULAR | Status: DC | PRN

## 2021-07-28 MED ORDER — ONDANSETRON HCL 4 MG/2ML IJ SOLN: 4.0000 mg | Freq: Three times a day (TID) | INTRAMUSCULAR | Status: DC | PRN

## 2021-07-28 MED ORDER — SIMETHICONE 80 MG PO CHEW
80.0000 mg | CHEWABLE_TABLET | Freq: Three times a day (TID) | ORAL | Status: DC
  Administered 2021-07-28 – 2021-07-31 (×8): 80 mg via ORAL
  Filled 2021-07-28 (×9): qty 1

## 2021-07-28 MED ORDER — PROMETHAZINE HCL 25 MG/ML IJ SOLN: 6.2500 mg | INTRAMUSCULAR | Status: DC | PRN

## 2021-07-28 MED ORDER — LEVONORGESTREL 20.1 MCG/DAY IU IUD
1.0000 | INTRAUTERINE_SYSTEM | Freq: Once | INTRAUTERINE | Status: AC
  Administered 2021-07-28: 1 via INTRAUTERINE

## 2021-07-28 MED ORDER — OXYTOCIN-SODIUM CHLORIDE 30-0.9 UT/500ML-% IV SOLN: 2.5000 [IU]/h | INTRAVENOUS | Status: AC

## 2021-07-28 MED ORDER — ACETAMINOPHEN 10 MG/ML IV SOLN
INTRAVENOUS | Status: DC | PRN
  Administered 2021-07-28: 1000 mg via INTRAVENOUS

## 2021-07-28 MED ORDER — MENTHOL 3 MG MT LOZG: 1.0000 | LOZENGE | OROMUCOSAL | Status: DC | PRN

## 2021-07-28 MED ORDER — COCONUT OIL OIL
1.0000 "application " | TOPICAL_OIL | Status: DC | PRN
  Administered 2021-07-30: 1 via TOPICAL

## 2021-07-28 MED ORDER — KETOROLAC TROMETHAMINE 30 MG/ML IJ SOLN
30.0000 mg | Freq: Four times a day (QID) | INTRAMUSCULAR | Status: AC | PRN
  Administered 2021-07-28 – 2021-07-29 (×2): 30 mg via INTRAVENOUS

## 2021-07-28 MED ORDER — SENNA 8.6 MG PO TABS
1.0000 | ORAL_TABLET | Freq: Every day | ORAL | Status: DC | PRN
  Administered 2021-07-31: 8.6 mg via ORAL
  Filled 2021-07-28 (×3): qty 1

## 2021-07-28 MED ORDER — MORPHINE SULFATE (PF) 0.5 MG/ML IJ SOLN
INTRAMUSCULAR | Status: AC
  Filled 2021-07-28: qty 10

## 2021-07-28 MED ORDER — NALOXONE HCL 4 MG/10ML IJ SOLN
1.0000 ug/kg/h | INTRAVENOUS | Status: DC | PRN
  Filled 2021-07-28: qty 5

## 2021-07-28 MED ORDER — CEFAZOLIN SODIUM-DEXTROSE 2-4 GM/100ML-% IV SOLN
2.0000 g | INTRAVENOUS | Status: AC
  Administered 2021-07-28: 2 g via INTRAVENOUS

## 2021-07-28 MED ORDER — FENTANYL CITRATE (PF) 100 MCG/2ML IJ SOLN
INTRAMUSCULAR | Status: DC | PRN
  Administered 2021-07-28: 15 ug via INTRATHECAL

## 2021-07-28 MED ORDER — NALBUPHINE HCL 10 MG/ML IJ SOLN: 5.0000 mg | Freq: Once | INTRAMUSCULAR | Status: DC | PRN

## 2021-07-28 MED ORDER — KETOROLAC TROMETHAMINE 30 MG/ML IJ SOLN: 30.0000 mg | Freq: Four times a day (QID) | INTRAMUSCULAR | Status: AC | PRN

## 2021-07-28 MED ORDER — NALBUPHINE HCL 10 MG/ML IJ SOLN: 5.0000 mg | INTRAMUSCULAR | Status: DC | PRN

## 2021-07-28 MED ORDER — DEXAMETHASONE SODIUM PHOSPHATE 4 MG/ML IJ SOLN
INTRAMUSCULAR | Status: DC | PRN
  Administered 2021-07-28: 10 mg via INTRAVENOUS

## 2021-07-28 MED ORDER — MORPHINE SULFATE (PF) 0.5 MG/ML IJ SOLN
INTRAMUSCULAR | Status: DC | PRN
  Administered 2021-07-28: .15 mg via INTRATHECAL

## 2021-07-28 MED ORDER — MEPERIDINE HCL 25 MG/ML IJ SOLN: 6.2500 mg | INTRAMUSCULAR | Status: DC | PRN

## 2021-07-28 MED ORDER — STERILE WATER FOR IRRIGATION IR SOLN
Status: DC | PRN
  Administered 2021-07-28: 1000 mL

## 2021-07-28 MED ORDER — NALBUPHINE HCL 10 MG/ML IJ SOLN
5.0000 mg | INTRAMUSCULAR | Status: DC | PRN
Start: 2021-07-28 — End: 2021-07-31

## 2021-07-28 MED ORDER — SOD CITRATE-CITRIC ACID 500-334 MG/5ML PO SOLN
30.0000 mL | ORAL | Status: AC
  Administered 2021-07-28: 30 mL via ORAL

## 2021-07-28 MED ORDER — TETANUS-DIPHTH-ACELL PERTUSSIS 5-2.5-18.5 LF-MCG/0.5 IM SUSY
0.5000 mL | PREFILLED_SYRINGE | Freq: Once | INTRAMUSCULAR | Status: DC
Start: 2021-07-29 — End: 2021-07-31

## 2021-07-28 MED ORDER — WITCH HAZEL-GLYCERIN EX PADS: 1.0000 "application " | MEDICATED_PAD | CUTANEOUS | Status: DC | PRN

## 2021-07-28 MED ORDER — BUPIVACAINE IN DEXTROSE 0.75-8.25 % IT SOLN
INTRATHECAL | Status: DC | PRN
  Administered 2021-07-28: 1.6 mL via INTRATHECAL

## 2021-07-28 MED ORDER — SCOPOLAMINE 1 MG/3DAYS TD PT72
1.0000 | MEDICATED_PATCH | Freq: Once | TRANSDERMAL | Status: DC
  Administered 2021-07-28: 1.5 mg via TRANSDERMAL

## 2021-07-28 MED ORDER — DIBUCAINE (PERIANAL) 1 % EX OINT: 1.0000 "application " | TOPICAL_OINTMENT | CUTANEOUS | Status: DC | PRN

## 2021-07-28 MED ORDER — PHENYLEPHRINE HCL-NACL 20-0.9 MG/250ML-% IV SOLN
INTRAVENOUS | Status: DC | PRN
  Administered 2021-07-28: 60 ug/min via INTRAVENOUS

## 2021-07-28 MED ORDER — ONDANSETRON HCL 4 MG/2ML IJ SOLN
INTRAMUSCULAR | Status: AC
  Filled 2021-07-28: qty 2

## 2021-07-28 MED ORDER — KETOROLAC TROMETHAMINE 30 MG/ML IJ SOLN
INTRAMUSCULAR | Status: AC
  Filled 2021-07-28: qty 1

## 2021-07-28 MED ORDER — CEFAZOLIN SODIUM-DEXTROSE 2-4 GM/100ML-% IV SOLN
INTRAVENOUS | Status: AC
  Filled 2021-07-28: qty 100

## 2021-07-28 MED ORDER — FENTANYL CITRATE (PF) 100 MCG/2ML IJ SOLN
INTRAMUSCULAR | Status: AC
  Filled 2021-07-28: qty 2

## 2021-07-28 MED ORDER — ACETAMINOPHEN 10 MG/ML IV SOLN: 1000.0000 mg | Freq: Once | INTRAVENOUS | Status: DC | PRN

## 2021-07-28 MED ORDER — SOD CITRATE-CITRIC ACID 500-334 MG/5ML PO SOLN
30.0000 mL | Freq: Once | ORAL | Status: AC
  Administered 2021-07-28: 30 mL via ORAL

## 2021-07-28 MED ORDER — KETOROLAC TROMETHAMINE 30 MG/ML IJ SOLN
30.0000 mg | Freq: Four times a day (QID) | INTRAMUSCULAR | Status: AC
  Administered 2021-07-28 – 2021-07-29 (×3): 30 mg via INTRAVENOUS
  Filled 2021-07-28 (×4): qty 1

## 2021-07-28 MED ORDER — OXYTOCIN-SODIUM CHLORIDE 30-0.9 UT/500ML-% IV SOLN
INTRAVENOUS | Status: DC | PRN
  Administered 2021-07-28 (×2): 30 [IU] via INTRAVENOUS

## 2021-07-28 MED ORDER — NALOXONE HCL 0.4 MG/ML IJ SOLN: 0.4000 mg | INTRAMUSCULAR | Status: DC | PRN

## 2021-07-28 MED ORDER — ONDANSETRON HCL 4 MG/2ML IJ SOLN
INTRAMUSCULAR | Status: DC | PRN
  Administered 2021-07-28: 4 mg via INTRAVENOUS

## 2021-07-28 MED ORDER — SODIUM CHLORIDE 0.9% FLUSH: 3.0000 mL | INTRAVENOUS | Status: DC | PRN

## 2021-07-28 MED ORDER — SCOPOLAMINE 1 MG/3DAYS TD PT72
MEDICATED_PATCH | TRANSDERMAL | Status: AC
  Filled 2021-07-28: qty 1

## 2021-07-28 SURGICAL SUPPLY — 36 items
BENZOIN TINCTURE PRP APPL 2/3 (GAUZE/BANDAGES/DRESSINGS) ×2 IMPLANT
CHLORAPREP W/TINT 26ML (MISCELLANEOUS) ×2 IMPLANT
CLAMP CORD UMBIL (MISCELLANEOUS) IMPLANT
CLOTH BEACON ORANGE TIMEOUT ST (SAFETY) ×2 IMPLANT
DRSG OPSITE POSTOP 4X10 (GAUZE/BANDAGES/DRESSINGS) ×2 IMPLANT
ELECT REM PT RETURN 9FT ADLT (ELECTROSURGICAL) ×2
ELECTRODE REM PT RTRN 9FT ADLT (ELECTROSURGICAL) ×1 IMPLANT
EXTRACTOR VACUUM BELL STYLE (SUCTIONS) IMPLANT
GAUZE SPONGE 4X4 8PLY STR LF (GAUZE/BANDAGES/DRESSINGS) ×4 IMPLANT
GLOVE BIOGEL PI IND STRL 7.0 (GLOVE) ×2 IMPLANT
GLOVE BIOGEL PI INDICATOR 7.0 (GLOVE) ×2
GLOVE ECLIPSE 7.0 STRL STRAW (GLOVE) ×2 IMPLANT
GOWN STRL REUS W/TWL LRG LVL3 (GOWN DISPOSABLE) ×4 IMPLANT
KIT ABG SYR 3ML LUER SLIP (SYRINGE) ×2 IMPLANT
NEEDLE HYPO 25X5/8 SAFETYGLIDE (NEEDLE) ×2 IMPLANT
NS IRRIG 1000ML POUR BTL (IV SOLUTION) ×2 IMPLANT
PACK C SECTION WH (CUSTOM PROCEDURE TRAY) ×2 IMPLANT
PAD ABD 8X10 STRL (GAUZE/BANDAGES/DRESSINGS) ×2 IMPLANT
PAD OB MATERNITY 4.3X12.25 (PERSONAL CARE ITEMS) ×2 IMPLANT
PENCIL SMOKE EVAC W/HOLSTER (ELECTROSURGICAL) ×2 IMPLANT
RTRCTR C-SECT PINK 25CM LRG (MISCELLANEOUS) ×2 IMPLANT
STRIP CLOSURE SKIN 1/2X4 (GAUZE/BANDAGES/DRESSINGS) ×2 IMPLANT
SUT MNCRL 0 VIOLET CTX 36 (SUTURE) ×2 IMPLANT
SUT MONOCRYL 0 CTX 36 (SUTURE) ×4
SUT PLAIN 0 NONE (SUTURE) IMPLANT
SUT PLAIN 2 0 (SUTURE)
SUT PLAIN 2 0 XLH (SUTURE) IMPLANT
SUT PLAIN ABS 2-0 CT1 27XMFL (SUTURE) IMPLANT
SUT VIC AB 0 CTX 36 (SUTURE) ×2
SUT VIC AB 0 CTX36XBRD ANBCTRL (SUTURE) ×1 IMPLANT
SUT VIC AB 2-0 CT1 27 (SUTURE) ×2
SUT VIC AB 2-0 CT1 TAPERPNT 27 (SUTURE) ×1 IMPLANT
SUT VIC AB 4-0 KS 27 (SUTURE) ×2 IMPLANT
TOWEL OR 17X24 6PK STRL BLUE (TOWEL DISPOSABLE) ×2 IMPLANT
TRAY FOLEY W/BAG SLVR 14FR LF (SET/KITS/TRAYS/PACK) IMPLANT
WATER STERILE IRR 1000ML POUR (IV SOLUTION) ×2 IMPLANT

## 2021-07-28 NOTE — Anesthesia Preprocedure Evaluation (Addendum)
Anesthesia Evaluation  Patient identified by MRN, date of birth, ID band Patient awake    Reviewed: Allergy & Precautions, H&P , NPO status , Patient's Chart, lab work & pertinent test results  Airway Mallampati: I  TM Distance: >3 FB Neck ROM: full    Dental no notable dental hx.    Pulmonary former smoker,    Pulmonary exam normal        Cardiovascular Exercise Tolerance: Good negative cardio ROS Normal cardiovascular exam Rhythm:regular Rate:Normal     Neuro/Psych PSYCHIATRIC DISORDERS Anxiety Depression negative neurological ROS     GI/Hepatic negative GI ROS, (+)     substance abuse (h/o benzo and alcohol abuse in the past)  ,   Endo/Other  obesity  Renal/GU negative Renal ROS  negative genitourinary   Musculoskeletal negative musculoskeletal ROS (+)   Abdominal Normal abdominal exam  (+)   Peds  Hematology  (+) anemia ,   Anesthesia Other Findings   Reproductive/Obstetrics (+) Pregnancy                            Anesthesia Physical  Anesthesia Plan  ASA: 2  Anesthesia Plan: Spinal   Post-op Pain Management:    Induction:   PONV Risk Score and Plan: 2 and Treatment may vary due to age or medical condition  Airway Management Planned: Natural Airway  Additional Equipment: None  Intra-op Plan:   Post-operative Plan:   Informed Consent: I have reviewed the patients History and Physical, chart, labs and discussed the procedure including the risks, benefits and alternatives for the proposed anesthesia with the patient or authorized representative who has indicated his/her understanding and acceptance.     Dental advisory given  Plan Discussed with: Anesthesiologist and CRNA  Anesthesia Plan Comments: (Spinal. GETA as backup. Tanna Furry, MD  )       Anesthesia Quick Evaluation
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
## 2021-07-28 NOTE — Op Note (Signed)
Operative Note   Patient: Veronica Moore  Date of Procedure: 07/28/2021  Procedure: Primary Low Transverse Cesarean and post-placental IUD insertion  Indications:  Elective; hx of shoulder dystocia with brachial plexus injury  Pre-operative Diagnosis: Elective CS   Post-operative Diagnosis: Same  TOLAC Candidate: Yes   Surgeon: Surgeon(s) and Role:    * Venora Maples, MD - Primary    * Worthy Rancher, MD - Assisting  An experienced assistant was required given the standard of surgical care given the complexity of the case.  This assistant was needed for exposure, dissection, suctioning, retraction, instrument exchange, assisting with delivery with administration of fundal pressure, and for overall help during the procedure.   Anesthesia: Spinal  Anesthesiologist: Tanna Furry, MD   Antibiotics: Cefazolin 2g   Estimated Blood Loss: 572 ml   Total IV Fluids: 2500 ml  Urine Output:  150 cc OF clear urine  Specimens: None    Complications: no complications   Indications: Veronica Moore is a 25 y.o. [id number] with an IUP [redacted]w[redacted]d presenting for scheduled cesarean secondary to the indications listed above.  The risks of surgery were discussed with the patient including but were not limited to: bleeding which may require transfusion or reoperation; infection which may require antibiotics; injury to bowel, bladder, ureters or other surrounding organs; injury to the fetus; need for additional procedures including hysterectomy in the event of a life-threatening hemorrhage; formation of adhesions; placental abnormalities wth subsequent pregnancies; incisional problems; thromboembolic phenomenon and other postoperative/anesthesia complications.   For the postplacental IUD placement, discussed risks of postpartum expulsion, irregular bleeding, cramping, infection, malpositioning or misplacement of the IUD which may require further procedures. Also discussed >99% contraception efficacy,  increased risk of ectopic pregnancy with failure of method.   The patient concurred with the proposed plan, giving informed written consent for the procedures.  Patient has been NPO since last night she will remain NPO for procedure.  Anesthesia and OR aware. Preoperative prophylactic antibiotics and SCDs ordered on call to the OR.   Findings: Viable infant in cephalic presentation, no nuchal cord present. Apgars 9 , 9 , . Weight 4040 g . Clear amniotic fluid. Normal placenta, three vessel cord. Normal uterus, Normal bilateral fallopian tubes, Normal bilateral ovaries.  Procedure Details:  A Time Out was held and the above information confirmed. The patient received intravenous antibiotics and had sequential compression devices applied to her lower extremities preoperatively. The patient was taken back to the operative suite where spinal anesthesia was administered. After induction of anesthesia, the patient was draped and prepped in the usual sterile manner and placed in a dorsal supine position with a leftward tilt. A low transverse skin incision was made with scalpel and carried down through the subcutaneous tissue to the fascia. Fascial incision was made and extended transversely bluntly. The fascia was separated from the underlying rectus tissue superiorly and inferiorly. The rectus muscles were separated in the midline bluntly and the peritoneum was entered bluntly. An Alexis retractor was placed to aid in visualization of the uterus. A bladder flap was not developed. A low transverse uterine incision was made. The infant was successfully delivered from cephalic presentation, the umbilical cord was clamped after 1 minute. Cord ph was not sent, and cord blood was obtained for evaluation. The placenta was removed Intact and appeared normal.   A Liletta IUD was then removed from it's packaging in a sterile manner and the strings were trimmed to approximately 10 cm. The IUD was placed manually  at the  uterine fundus and the strings were passed through the cervical os with a Kelly clamp.   The uterine incision was closed with running locked sutures of 0-Monocryl, and then a second imbricating layer was also placed with 0-Monocryl. Overall, excellent hemostasis was noted. The abdomen and the pelvis were cleared of all clot and debris and the Jon Gills was removed. Hemostasis was confirmed on all surfaces.  The peritoneum was reapproximated using 2-0 vicryl . The fascia was then closed using 0 Vicryl in a running fashion. The subcutaneous layer was reapproximated with plain gut and the skin was closed with a 4-0 vicryl subcuticular stitch. The patient tolerated the procedure well. Sponge, lap, instrument and needle counts were correct x 2. She was taken to the recovery room in stable condition.  Disposition: PACU - hemodynamically stable.   Signed: Worthy Rancher, MD Center for [facility])  [place] LOT #: [phone]; EXP 04/30/2024
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
## 2021-07-28 NOTE — Anesthesia Postprocedure Evaluation (Signed)
Anesthesia Post Note  Patient: Veronica Moore  Procedure(s) Performed: CESAREAN SECTION     Patient location during evaluation: Mother Baby Anesthesia Type: Spinal Level of consciousness: oriented and awake and alert Pain management: pain level controlled Vital Signs Assessment: post-procedure vital signs reviewed and stable Respiratory status: spontaneous breathing and respiratory function stable Cardiovascular status: blood pressure returned to baseline and stable Postop Assessment: no headache, no backache, no apparent nausea or vomiting, able to ambulate and spinal receding Anesthetic complications: no   No notable events documented.  Last Vitals:  Vitals:   07/28/21 1430 07/28/21 1441  BP: 100/69 (!) 113/59  Pulse: 62 62  Resp: 20 18  Temp: 36.4 C 36.7 C  SpO2: 98% 98%    Last Pain:  Vitals:   07/28/21 1441  TempSrc: Oral  PainSc: 0-No pain   Pain Goal:                Epidural/Spinal Function Cutaneous sensation: Normal sensation (07/28/21 1441), Patient able to flex knees: Yes (07/28/21 1441), Patient able to lift hips off bed: Yes (07/28/21 1441), Back pain beyond tenderness at insertion site: No (07/28/21 1441), Progressively worsening motor and/or sensory loss: No (07/28/21 1441), Bowel and/or bladder incontinence post epidural: No (07/28/21 1441)  [name]

## 2021-07-28 NOTE — Lactation Note (Signed)
This note was copied from a baby's chart. Lactation Consultation Note  Patient Name: Veronica Moore [id number] Date: 07/28/2021 Reason for consult: Initial assessment;Term Age:27 hours  LC in to room for initial consult. Mother states infant latched well after delivery, no pain or discomfort. Mother requested DEBP and set up by RN. Mother collected ~18 mL. Assisted with spoon-feeding because "Sharlet [place]ina" has been very sleepy when attempting to breastfeed. Reviewed normal newborn behavior during first 24h, expected output and feeding frequency. Talked about tummy size. Discussed pumping frequency and milk storage.  Plan: 1-Skin to skin, aim for a deep, comfortable latch and breastfeed on demand or 8-12 times in 24h period. 2-Encouraged maternal rest, hydration and food intake.  3-Contact LC as needed for feeds/support/concerns/questions   All questions answered at this time. Provided Lactation services brochure and promoted [employer] booklet information.    Maternal Data Has patient been taught Hand Expression?: No Does the patient have breastfeeding experience prior to this delivery?: No How long did the patient breastfeed?: Exclusive puming for 49months  Feeding Mother's Current Feeding Choice: Breast Milk  Lactation Tools Discussed/Used Tools: Pump;Flanges Flange Size: 24 Breast pump type: Double-Electric Breast Pump Pump Education: Setup, frequency, and cleaning;Milk Storage Reason for Pumping: Mother's request Pumping frequency: 8-12 times in 24h Pumped volume: 18 mL  Interventions Interventions: Breast feeding basics reviewed;Skin to skin;DEBP;Expressed milk;Support pillows;Education  Discharge Pump: Personal;DEBP WIC Program: Yes  Consult Status Consult Status: Follow-up Date: 07/29/21 Follow-up type: In-patient    Karol Skarzynski A Higuera Ancidey 07/28/2021, 7:43 PM

## 2021-07-28 NOTE — Anesthesia Postprocedure Evaluation (Deleted)
Anesthesia Post Note  Patient: Veronica Moore  Procedure(s) Performed: CESAREAN SECTION     Patient location during evaluation: Mother Baby Anesthesia Type: Spinal Level of consciousness: awake and alert Pain management: pain level controlled Vital Signs Assessment: post-procedure vital signs reviewed and stable Respiratory status: spontaneous breathing, nonlabored ventilation and respiratory function stable Cardiovascular status: stable Postop Assessment: no headache, no backache, epidural receding, no apparent nausea or vomiting, patient able to bend at knees, adequate PO intake and able to ambulate Anesthetic complications: no   No notable events documented.  Last Vitals:  Vitals:   07/28/21 1309 07/28/21 1315  BP: 119/77   Pulse: 94 73  Resp: 16 13  Temp: 36.5 C   SpO2: 98% 97%    Last Pain:  Vitals:   07/28/21 1309  TempSrc: Oral   Pain Goal:                Epidural/Spinal Function Cutaneous sensation: Tingles (07/28/21 1309), Patient able to flex knees: No (07/28/21 1309), Patient able to lift hips off bed: No (07/28/21 1309), Back pain beyond tenderness at insertion site: No (07/28/21 1309), Progressively worsening motor and/or sensory loss: No (07/28/21 1309), Bowel and/or bladder incontinence post epidural: No (07/28/21 1309)  Devarious Pavek Hristova

## 2021-07-28 NOTE — Transfer of Care (Signed)
Immediate Anesthesia Transfer of Care Note  Patient: Veronica Moore  Procedure(s) Performed: CESAREAN SECTION  Patient Location: PACU  Anesthesia Type:Spinal  Level of Consciousness: awake, alert  and oriented  Airway & Oxygen Therapy: Patient Spontanous Breathing  Post-op Assessment: Report given to RN and Post -op Vital signs reviewed and stable  Post vital signs: Reviewed and stable  Last Vitals:  Vitals Value Taken Time  BP 113/79 07/28/21 1315  Temp 36.5 C 07/28/21 1309  Pulse 64 07/28/21 1317  Resp 16 07/28/21 1317  SpO2 99 % 07/28/21 1317  Vitals shown include unvalidated device data.  Last Pain:  Vitals:   07/28/21 1309  TempSrc: Oral         Complications: No notable events documented.

## 2021-07-28 NOTE — Anesthesia Procedure Notes (Signed)
Spinal  Patient location during procedure: OR Start time: 07/28/2021 11:45 AM End time: 07/28/2021 11:50 AM Reason for block: surgical anesthesia Staffing Performed: anesthesiologist  Anesthesiologist: [name], MD Preanesthetic Checklist Completed: patient identified, IV checked, risks and benefits discussed, surgical consent, monitors and equipment checked, pre-op evaluation and timeout performed Spinal Block Patient position: sitting Prep: DuraPrep Patient monitoring: cardiac monitor, continuous pulse ox and blood pressure Approach: midline Injection technique: single-shot Needle Needle type: Pencan  Needle gauge: 24 G Needle length: 9 cm Assessment Sensory level: T4 Events: CSF return Additional Notes Functioning IV was confirmed and monitors were applied. Sterile prep and drape, including hand hygiene and sterile gloves were used. The patient was positioned and the spine was prepped. The skin was anesthetized with lidocaine.  Free flow of clear CSF was obtained prior to injecting local anesthetic into the CSF.  The spinal needle aspirated freely following injection.  The needle was carefully withdrawn.  The patient tolerated the procedure well.

## 2021-07-29 LAB — CBC
HCT: 28.9 % — ABNORMAL LOW (ref 36.0–46.0)
Hemoglobin: 9.3 g/dL — ABNORMAL LOW (ref 12.0–15.0)
MCH: 27.5 pg (ref 26.0–34.0)
MCHC: 32.2 g/dL (ref 30.0–36.0)
MCV: 85.5 fL (ref 80.0–100.0)
Platelets: 159 10*3/uL (ref 150–400)
RBC: 3.38 MIL/uL — ABNORMAL LOW (ref 3.87–5.11)
RDW: 15 % (ref 11.5–15.5)
WBC: 11.6 10*3/uL — ABNORMAL HIGH (ref 4.0–10.5)
nRBC: 0 % (ref 0.0–0.2)

## 2021-07-29 MED ORDER — GABAPENTIN 600 MG PO TABS
300.0000 mg | ORAL_TABLET | Freq: Three times a day (TID) | ORAL | Status: DC
  Administered 2021-07-29 (×3): 300 mg via ORAL
  Filled 2021-07-29 (×10): qty 0.5
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
## 2021-07-29 NOTE — Lactation Note (Signed)
This note was copied from a baby's chart. Lactation Consultation Note  Patient Name: Veronica Moore [id number] Date: 07/29/2021 Reason for consult: Follow-up assessment;Term Age:27 hours   P2 mother whose infant is now 25 hours old.  This is a term baby at 39+0 weeks.  Mother did not breast feed her first child (now 101 years old).  She pumped and bottle fed for 18 months.  Reviewed breast feeding basics with mother.  Mother able to hand express colostrum.  Mother is concerned that her nipples may be too large for latching.  Observed mother's breasts to be soft and non tender and nipples are large.  Discussed techniques that will help baby open wide for a deep latch; discussed using a chin tug as needed for a deeper latch.    Mother was interested in attempting to latch baby.  At this time, he was not showing feeding cues.  Asked her to remove clothing and feed STS.  Assisted to arouse baby and positioned him in the football hold.  Baby was not at all interested in latching.  Reassurance given.  Placed baby STS on mother's chest and he fell asleep. Informed mother that he may begin cluster feeding tonight.    Mother will feed 8-12 times/24 hours or sooner if baby shows cues.  She will call her RN/LC for latch assistance as needed.  No support person present at this time but mother's support person will be here later today.  Mother has a DEBP for home use.   Maternal Data Has patient been taught Hand Expression?: Yes Does the patient have breastfeeding experience prior to this delivery?: No (pumped and bottle fed)  Feeding Mother's Current Feeding Choice: Breast Milk  LATCH Score Latch: Too sleepy or reluctant, no latch achieved, no sucking elicited.  Audible Swallowing: None  Type of Nipple: Everted at rest and after stimulation  Comfort (Breast/Nipple): Soft / non-tender  Hold (Positioning): Assistance needed to correctly position infant at breast and maintain latch.  LATCH Score:  5   Lactation Tools Discussed/Used    Interventions Interventions: Breast feeding basics reviewed;Assisted with latch;Skin to skin;Hand express;Adjust position;Support pillows;Education;Position options  Discharge Pump: Personal  Consult Status Consult Status: Follow-up Date: 07/30/21 Follow-up type: In-patient    Dora Sims 07/29/2021, 3:31 PM

## 2021-07-29 NOTE — Lactation Note (Signed)
This note was copied from a baby's chart. Lactation Consultation Note  Patient Name: Veronica Moore [id number] Date: 07/29/2021 Reason for consult: Follow-up assessment;Mother's request;Difficult latch (Per mom, she feels infant has shallow latch and pain with latch.) Age:27 hours LC entered the room, mom had pumped 20 mls of colostrum. Infant was in bassinet cuing to breastfeed. Mom latched infant on her right breast using the football hold position, infant latched with depth, swallow heard, infant was still breastfeeding after 15 minutes when LC left the room. Mom plans to supplement infant with her EBM ( her choice) after infant finish breastfeeding.  LC gave mom supplement sheet, Day 2 ( 7-12 mls) per feeding. Mom know to call RN/LC if she has breastfeeding questions, concerns or needs further latch assistance.  Maternal Data    Feeding Mother's Current Feeding Choice: Breast Milk Nipple Type: Extra Slow Flow  LATCH Score Latch: Grasps breast easily, tongue down, lips flanged, rhythmical sucking.  Audible Swallowing: Spontaneous and intermittent  Type of Nipple: Everted at rest and after stimulation  Comfort (Breast/Nipple): Soft / non-tender  Hold (Positioning): Assistance needed to correctly position infant at breast and maintain latch.  LATCH Score: 9   Lactation Tools Discussed/Used Breast pump type: Double-Electric Breast Pump Pumping frequency: Mom is pumping every 3 hours for 15 minutes. Pumped volume: 20 mL  Interventions Interventions: Assisted with latch;Adjust position;Support pillows;Skin to skin;Breast massage;Position options;Hand express;Expressed milk;Education;DEBP;Breast compression;Breast feeding basics reviewed  Discharge    Consult Status Consult Status: Follow-up Date: 07/30/21 Follow-up type: In-patient    Danelle Earthly 07/29/2021, 10:34 PM

## 2021-07-29 NOTE — Progress Notes (Signed)
Post Partum Day/POD 1 Subjective: no complaints, up ad lib, voiding, tolerating PO, and + flatus  Objective: Blood pressure 109/74, pulse (!) 56, temperature 97.8 F (36.6 C), temperature source Oral, resp. rate 17, height 5' (1.524 m), weight 73 kg, last menstrual period 10/28/2020, SpO2 98 %, unknown if currently breastfeeding.  Physical Exam:  General: alert, cooperative, appears stated age, and no distress Lochia: appropriate Uterine Fundus: firm Incision: healing well, no significant drainage, no significant erythema, honeycomb dressing in place DVT Evaluation: No evidence of DVT seen on physical exam. Negative Homan's sign.  Recent Labs    07/28/21 1051 07/29/21 0600  HGB 11.4* 9.3*  HCT 35.2* 28.9*    Assessment/Plan: Plan for discharge tomorrow, Breastfeeding, Lactation consult, Circumcision prior to discharge, and Contraception postplacental IUD performed.  Patient's pain is well controlled on Ibuprofen and Tylenol, refusing oxycodone (has listed allergy due to severe vomiting side effect), is taking gabapentin.     LOS: 1 day   Jen Mow, DO 07/29/2021, 3:01 PM

## 2021-07-30 MED ORDER — ONDANSETRON HCL 4 MG PO TABS
4.0000 mg | ORAL_TABLET | Freq: Once | ORAL | Status: AC
  Administered 2021-07-30: 4 mg via ORAL
  Filled 2021-07-30: qty 1

## 2021-07-30 MED ORDER — IBUPROFEN 800 MG PO TABS
800.0000 mg | ORAL_TABLET | Freq: Three times a day (TID) | ORAL | Status: DC
Start: 2021-07-30 — End: 2021-07-31
  Administered 2021-07-30 – 2021-07-31 (×4): 800 mg via ORAL
  Filled 2021-07-30 (×4): qty 1

## 2021-07-30 MED ORDER — MORPHINE SULFATE 15 MG PO TABS
15.0000 mg | ORAL_TABLET | Freq: Once | ORAL | Status: AC
  Administered 2021-07-30: 15 mg via ORAL
  Filled 2021-07-30: qty 1

## 2021-07-30 NOTE — Progress Notes (Signed)
Post Partum Day/POD#2 Subjective: Veronica Moore  is a 25 y.o. [id number] s/p repeat C/S at [redacted]w[redacted]d.  She reports she is doing well. No acute events overnight. She denies any problems with ambulating, voiding or po intake. Denies nausea or vomiting.  Pain is well controlled on ibuprofen and gabapentin.  Lochia is moderate and improving. She reports that she woke up this morning to the baby crying and she found him on the floor. She denies having the baby in the bed with her while she was sleeping. She reports she "remembers placing him in the bassinet and leaving him unswaddled." She states she is unsure if she woke up in the middle of the night to take him and missed putting him in the bassinet and that is when he got on the floor. She does not know how long the baby was on the floor before she woke up to find him. She did not report the fall to her nightshift RN. She and her boyfriend, Harrold Donath, states they "checked him and he looks fine." She also reports that he has been nursing fine since the fall.  Objective: Blood pressure 119/83, pulse 80, temperature 98.4 F (36.9 C), temperature source Oral, resp. rate 18, height 5' (1.524 m), weight 73 kg, last menstrual period 10/28/2020, SpO2 100 %, currently breastfeeding.  Physical Exam:  General: alert, cooperative, and mild distress Lochia: appropriate Uterine Fundus: firm Incision: healing well, slight serosanguneous drainage present - borders marked with pen DVT Evaluation: No evidence of DVT seen on physical exam. Negative Homan's sign. No cords or calf tenderness. No significant calf/ankle edema.  Recent Labs    07/28/21 1051 07/29/21 0600  HGB 11.4* 9.3*  HCT 35.2* 28.9*    Assessment/Plan: Plan for discharge tomorrow, Circumcision prior to discharge, and Contraception ppIUD   LOS: 2 days   Raelyn Mora, CNM 07/30/2021, 6:17 AM

## 2021-07-30 NOTE — Lactation Note (Signed)
This note was copied from a baby's chart. Lactation Consultation Note  Patient Name: Veronica Moore [id number] Date: 07/30/2021 Reason for consult: Follow-up assessment Age:27 hours  LC in to room for follow up. Mother states infant still has a shallow latch. Mother is pumping and collecting ~60 mL per pumping session. Offer strategies to improve latch and encouraged to call LC to evaluate latch when "Veronica Moore" is ready to eat again.   Talked about weight loss, voids and stools as signs of good milk transfer. Encouraged to "top infant up" with expressed breast milk.  Contact LC as needed for feeds/support/concerns/questions. All questions answered at this time.   Feeding Mother's Current Feeding Choice: Breast Milk  Lactation Tools Discussed/Used Tools: Pump Breast pump type: Double-Electric Breast Pump Reason for Pumping: mother's preference Pumping frequency: every 3h Pumped volume: 60 mL  Interventions Interventions: Breast feeding basics reviewed;Education;DEBP;Expressed milk Consult Status Consult Status: Follow-up Date: 07/31/21 Follow-up type: In-patient    [name] 07/30/2021, 6:26 PM

## 2021-07-30 NOTE — Lactation Note (Signed)
This note was copied from a baby's chart. Lactation Consultation Note  Patient Name: Boy Chayce Rullo [id number] Date: 07/30/2021 Reason for consult: Mother's request Age:27 hours  Mother requests assistance with latch. Demonstrated alignment, breast support and nipple to nose with chin first. Latched infant immediately. Noted suckling and swallowing. Encouraged mother to lay back to maintain a deep latch. Mother denies discomfort or pain.   Educated mom about deep latch for an optimal breastfeeding session.  Contact LC as needed for feeds/support/concerns/questions   Feeding Mother's Current Feeding Choice: Breast Milk  LATCH Score Latch: Grasps breast easily, tongue down, lips flanged, rhythmical sucking.  Audible Swallowing: Spontaneous and intermittent  Type of Nipple: Everted at rest and after stimulation  Comfort (Breast/Nipple): Soft / non-tender  Hold (Positioning): Assistance needed to correctly position infant at breast and maintain latch.  LATCH Score: 9   Lactation Tools Discussed/Used Tools: Pump Breast pump type: Double-Electric Breast Pump Reason for Pumping: mother's preference Pumping frequency: every 3h Pumped volume: 60 mL  Interventions Interventions: Assisted with latch;Adjust position;Support pillows;Education  Consult Status Consult Status: Follow-up Date: 07/31/21 Follow-up type: In-patient    Sumaiya Arruda A Higuera Ancidey 07/30/2021, 7:06 PM

## 2021-07-30 NOTE — Progress Notes (Addendum)
Post Operative Day 2 Subjective: up ad lib, voiding, tolerating PO, + flatus, and lochia normal. Patient states she is feeling some pain on current pain regiment, but is manageable. She states that she is having cramping when feeding baby. Patient states she woke up to baby on the floor today this AM. She states that she was breastfeeding him and is unsure if she put him back in his basinet or if he fell off the bed when she was nursing him this AM. Patient states she was really drowsy and cant say for certain what occurred. She states she is being more careful now and will have someone help her when she starts to feel drowsy. Peds team has assessed baby and are doing 1hr neuro checks.  Objective: Blood pressure 119/83, pulse 80, temperature 98.4 F (36.9 C), temperature source Oral, resp. rate 18, height 5' (1.524 m), weight 73 kg, last menstrual period 10/28/2020, SpO2 100 %, unknown if currently breastfeeding.  Physical Exam:  General: alert, cooperative, and no distress Lochia: appropriate Uterine Fundus: firm Incision: healing well, no significant drainage DVT Evaluation: No evidence of DVT seen on physical exam. No cords or calf tenderness. No significant calf/ankle edema.  Recent Labs    07/28/21 1051 07/29/21 0600  HGB 11.4* 9.3*  HCT 35.2* 28.9*    Assessment/Plan: -Patient is meeting all milestones post-op. -Plan for discharge tomorrow, Breastfeeding, Circumcision prior to discharge, and Contraception ppIUD -Hgb drop from 11.4>9.3, patient is feeling dizzy when standing, start IV Venofer 500mg    LOS: 2 days   Veronica Moore J Rodriguez-Teodoro 07/30/2021, 8:29 AM

## 2021-07-31 ENCOUNTER — Encounter: Payer: Medicaid Other | Admitting: Obstetrics and Gynecology

## 2021-07-31 MED ORDER — IBUPROFEN 800 MG PO TABS: 800.0000 mg | ORAL_TABLET | Freq: Three times a day (TID) | ORAL | 0 refills | Status: DC

## 2021-07-31 MED ORDER — POLYETHYLENE GLYCOL 3350 17 G PO PACK: 17.0000 g | PACK | Freq: Every day | ORAL | 0 refills | Status: DC

## 2021-07-31 MED ORDER — SENNA 8.6 MG PO TABS: 1.0000 | ORAL_TABLET | Freq: Every day | ORAL | 0 refills | Status: DC | PRN

## 2021-07-31 MED ORDER — ACETAMINOPHEN 500 MG PO TABS: 1000.0000 mg | ORAL_TABLET | Freq: Four times a day (QID) | ORAL | 0 refills | Status: DC

## 2021-07-31 NOTE — Social Work (Signed)
CSW verbally consulted by RN and informed MOB expressed she is interested in PPD resources. Also informed MOB stated she feels "guilty about staying with her grandmother".  CSW met with MOB to assess. CSW observed MOB feeding infant and assessed her current feelings. MOB reported she is doing well emotionally, however trying to manage pain following the c-section. MOB denies experiencing any feelings of PPD at this time, but expressed she would like additional resources. CSW provided duplicate PPD resources and discussed Medicaid providers. MOB reported she no longer attends therapy, do to the busyness of life. MOB was receptive to referrals for [employer] [facility] [employer], [facility] and [facility] at [facility]. MOB denies any current feelings of SI or HI. MOB expressed no additional needs at this time.   Ninnie Fein, MSW, LCSWA Clinical Social Work [facility] [phone] 

## 2021-07-31 NOTE — Lactation Note (Signed)
This note was copied from a baby's chart. Lactation Consultation Note  Patient Name: Veronica Moore KGYJE'H Date: 07/31/2021   Age:27 hours  Mom's milk has come to volume; she is able to pump 30-60 mL from each breast (Mom has been pumping one breast while or after she has fed off the other). Mom's R nipple has some thin scabbing on the surface, but appears to be healing. Mom reports a "tiny scab" on the L breast.  Mom has been using size 30 flanges on both breasts. Her R breast appears that she could use a smaller flange size. Mom confirmed that areola is being pulled into flange on the R breast. I encouraged Mom to go down 1-2 flange sizes on her R breast. Mom will try a size 24 first & go up to a size 27, as needed. Mom does not see areola begin pulled in with the size 30 on the L breast and says the size 27 was too tight.  I'm concerned that Mom could be at risk for oversupply, since she is pumping in addition to successfully feeding infant at breast. I encouraged Mom to only pump if uncomfortable and to then, pump for comfort, but not to empty. Mom verbalized understanding. Infant does soften the breast when he latches.  Mom knows how to reach [place] for post-discharge questions.    Lurline Hare [facility] 07/31/2021, 1:27 PM

## 2021-08-05 ENCOUNTER — Ambulatory Visit: Payer: Medicaid Other

## 2021-08-07 ENCOUNTER — Encounter: Payer: Medicaid Other | Admitting: Obstetrics and Gynecology

## 2021-08-09 ENCOUNTER — Telehealth (HOSPITAL_COMMUNITY): Payer: Self-pay

## 2021-08-09 NOTE — Telephone Encounter (Signed)
No answer. Left message to return nurse call.  Marcelino Duster [facility] 08/09/2021,1410

## 2021-08-11 ENCOUNTER — Ambulatory Visit (INDEPENDENT_AMBULATORY_CARE_PROVIDER_SITE_OTHER): Payer: Medicaid Other | Admitting: *Deleted

## 2021-08-11 ENCOUNTER — Other Ambulatory Visit: Payer: Self-pay

## 2021-08-11 VITALS — BP 111/79 | HR 90 | Temp 98.4°F | Ht 60.0 in

## 2021-08-11 DIAGNOSIS — Z1332 Encounter for screening for maternal depression: Secondary | ICD-10-CM

## 2021-08-11 DIAGNOSIS — Z4889 Encounter for other specified surgical aftercare: Secondary | ICD-10-CM

## 2021-08-11 NOTE — Progress Notes (Signed)
   Subjective:     Veronica Moore is a 25 y.o. female who presents to the clinic 2 weeks status post  cesarean delivery  for  birth of newborn . Eating a regular diet without difficulty. Bowel movements are normal. Pain is controlled with current analgesics. Medications being used: acetaminophen and prescription NSAID's including ibuprofen (Motrin).  The following portions of the patient's history were reviewed and updated as appropriate: allergies, current medications, past family history, past medical history, past social history, past surgical history, and problem list.  Review of Systems Pertinent items are noted in HPI.    Objective:    BP 111/79 (BP Location: Left Arm, Patient Position: Sitting, Cuff Size: Normal)   Pulse 90   Temp 98.4 F (36.9 C) (Oral)   Ht 5' (1.524 m)   LMP 10/28/2020 (Within Days)   Breastfeeding Yes   BMI 31.44 kg/m  General:  alert, cooperative, and appears stated age  Abdomen: soft, non-tender  Incision:   healing well, no drainage, no erythema, no hernia, no seroma, no swelling, no dehiscence, incision well approximated     Assessment:    Doing well postoperatively.  Edinburgh Postnatal Depression scale: 2-negative.   Plan:    1. Continue any current medications. 2. Wound care discussed. 3. Activity restrictions: no lifting more than 15 pounds 4. Anticipated return to work:  to be determined at postpartum visit . 5. Follow up: 4 weeks for postpartum.   Clovis Pu, RN

## 2021-08-25 ENCOUNTER — Inpatient Hospital Stay (HOSPITAL_COMMUNITY)
Admission: AD | Admit: 2021-08-25 | Discharge: 2021-08-25 | Disposition: A | Discharge status: Home / Self Care | Payer: Medicaid Other | Attending: Family Medicine | Admitting: Family Medicine

## 2021-08-25 ENCOUNTER — Encounter (HOSPITAL_COMMUNITY): Payer: Self-pay | Admitting: Family Medicine

## 2021-08-25 DIAGNOSIS — Z98891 History of uterine scar from previous surgery: Secondary | ICD-10-CM

## 2021-08-25 DIAGNOSIS — O8601 Infection of obstetric surgical wound, superficial incisional site: Secondary | ICD-10-CM | POA: Diagnosis not present

## 2021-08-25 DIAGNOSIS — T8149XA Infection following a procedure, other surgical site, initial encounter: Secondary | ICD-10-CM | POA: Diagnosis not present

## 2021-08-25 MED ORDER — CEFADROXIL 1 G PO TABS: 1.0000 g | ORAL_TABLET | Freq: Every day | ORAL | 0 refills | Status: AC

## 2021-08-25 NOTE — MAU Provider Note (Signed)
History     CSN: [id number]  Arrival date and time: 08/25/21 1449   Chief Complaint  Patient presents with   Drainage from Incision   Veronica Moore is a 25 year old female G4 P2022, now 4 weeks postoperative from a repeat C/S on 8/29, presenting for evaluation of puslike discharge from her incision site.  She was seen for an incision check on 08/11/2021 with appropriate healing at that time.   She reports that last week she had a small area that was opened up superficially, however with keeping the area dry this healed up nicely.  However this morning she noted a small region centrally in the incision that was draining yellow pus with a little bit of redness of her skin around this area.  She otherwise has been doing well, with the exception of feeling fatigued postpartum.  She denies any fever, incision swelling, fluctuant mass underneath her skin, or abdominal pain. She is breastfeeding.    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)    Bipolar 1 disorder, depressed (HCC)    History of postpartum hemorrhage, currently pregnant    PTSD (post-traumatic stress disorder)    Suicide threat or attempt    history of SI and history of attempt 2015    Past Surgical History:  Procedure Laterality Date   CESAREAN SECTION N/A 07/28/2021   Procedure: CESAREAN SECTION;  Surgeon: Venora Maples, MD;  Location: [facility] LD [facility]S;  Service: Obstetrics;  Laterality: N/A;   NO PAST SURGERIES      Family History  Problem Relation Age of Onset   ADD / ADHD Mother    Alcohol abuse Mother    Anxiety disorder Mother    Bipolar disorder Mother    Drug abuse Mother    Seizures Mother    Sexual abuse Mother    ADD / ADHD Father    Alcohol abuse Father    Anxiety disorder Father    Bipolar disorder Father    Depression Father    Drug abuse Father    ADD / ADHD Sister    ADD /  ADHD Brother    Dementia Maternal Grandmother    Seizures Cousin    Schizophrenia Cousin    ADD / ADHD Sister     Social History   Tobacco Use   Smoking status: Former    Packs/day: 1.00    Years: 8.00    Pack years: 8.00    Types: Cigarettes    Quit date: 04/24/2019    Years since quitting: 2.3   Smokeless tobacco: Never   Tobacco comments:    Now smoking 1-2 a day.  Vaping Use   Vaping Use: Never used  Substance Use Topics   Alcohol use: Not Currently    Comment: ocassionally   Drug use: Not Currently    Types: Marijuana    Comment: none with pregnancy    Allergies:  Allergies  Allergen Reactions   Mangifera Indica Anaphylaxis    Mouth bleeding   Papaya Anaphylaxis   Pineapple Swelling, Other (See Comments) and Anaphylaxis    Reaction:  Mouth/lip swelling  Mouth bleeding   Red Dye Anaphylaxis, Other (See Comments) and Diarrhea    Pt is allergic to red dye 40.    Latex Itching   Orange Oil Hives   Oxycontin [Oxycodone] Nausea And Vomiting    No  medications prior to admission.    Review of Systems  Constitutional:  Positive for fatigue. Negative for chills, diaphoresis and fever.  Respiratory:  Negative for shortness of breath.   Cardiovascular:  Negative for chest pain.  Gastrointestinal:  Negative for abdominal distention, abdominal pain and nausea.  Genitourinary:  Negative for dysuria and vaginal discharge.  Neurological:  Negative for dizziness and light-headedness.  Psychiatric/Behavioral:  Negative for behavioral problems.   Physical Exam   Blood pressure 107/66, pulse 65, temperature 98 F (36.7 C), resp. rate 18, last menstrual period 10/28/2020, currently breastfeeding.  Physical Exam Constitutional:      General: She is not in acute distress.    Appearance: Normal appearance. She is not ill-appearing or diaphoretic.  HENT:     Head: Normocephalic and atraumatic.     Mouth/Throat:     Mouth: Mucous membranes are moist.  Eyes:      Extraocular Movements: Extraocular movements intact.  Cardiovascular:     Pulses: Normal pulses.  Pulmonary:     Effort: Pulmonary effort is normal.  Abdominal:     Palpations: Abdomen is soft.     Comments: Bilateral periphery of incision site appears to be healing well, intact and dry. Note a  Small few millimeter of open incision centrally with white/yellow puslike drainage expressed.  Appears superficial, difficult to probe due to small size however cannot see into the subcutaneous when drainage is removed with swab.  No fluctuant like mass or induration underneath.  Small amount of surrounding erythema.  Incision site tender to palpation.  Skin:    Capillary Refill: Capillary refill takes less than 2 seconds.  Neurological:     Mental Status: She is alert.  Psychiatric:        Behavior: Behavior normal.       Assessment and Plan   1. Wound infection after surgery 2. Status post C-section   Clinical exam consistent with incision infection without any systemic s/sx. No notable fluid collection/fluctuance to suggest concern for abscess formation at this time.  Rx'd Duricef 1 g daily X 7 days, compatible with breast-feeding. Instructed to keep area clean and dry.   Discharged home in stable condition.  MAU precautions discussed, see AVS.  Maintain postpartum visit next week return to clinic/MAU sooner if needed.  Allayne Stack 08/25/2021, 9:38 PM

## 2021-08-25 NOTE — Discharge Instructions (Signed)
If the area is not improving in the next several days or sooner if increased pus-like drainage, increased swelling or abscess like growth under the skin, spreading skin redness, or fever >100.4--please be seen either at the clinic or in the MAU.

## 2021-08-25 NOTE — MAU Note (Signed)
PP c-section 07/19/21.Pt reports her incision is opening in a small area and pus is coming out. Denies any fever or chills . Small amount of redness around area

## 2021-08-27 ENCOUNTER — Other Ambulatory Visit: Payer: Self-pay | Admitting: Certified Nurse Midwife

## 2021-08-27 ENCOUNTER — Ambulatory Visit: Payer: Medicaid Other | Admitting: Certified Nurse Midwife

## 2021-09-04 ENCOUNTER — Ambulatory Visit: Payer: Medicaid Other | Admitting: Obstetrics and Gynecology

## 2021-09-04 ENCOUNTER — Telehealth: Payer: Self-pay | Admitting: Obstetrics and Gynecology

## 2021-09-04 ENCOUNTER — Telehealth: Payer: Self-pay | Admitting: *Deleted

## 2021-09-04 NOTE — Telephone Encounter (Signed)
Received a call from the patient stating she had been throwing up all night. She stated her string needed to be cut, and her incision needed to be looked at. Tamika RN, informed me to let the patient know she could go to urgent care incase she needed to get IV fluids. Her appointment was rescheduled to next week.

## 2021-09-04 NOTE — Telephone Encounter (Signed)
Patient called reporting vomiting, constipation and possible infection of cesarean incision. Advised patient she would need to go urgent care or emergency room for further evaluation. Postpartum visit can be rescheduled for later date.  Clovis Pu, RN

## 2021-09-11 ENCOUNTER — Ambulatory Visit (INDEPENDENT_AMBULATORY_CARE_PROVIDER_SITE_OTHER): Payer: Medicaid Other | Admitting: Obstetrics and Gynecology

## 2021-09-11 ENCOUNTER — Other Ambulatory Visit: Payer: Self-pay

## 2021-09-11 DIAGNOSIS — Z4889 Encounter for other specified surgical aftercare: Secondary | ICD-10-CM | POA: Diagnosis not present

## 2021-09-11 DIAGNOSIS — Z3040 Encounter for surveillance of contraceptives, unspecified: Secondary | ICD-10-CM | POA: Diagnosis not present

## 2021-09-11 NOTE — Progress Notes (Signed)
Post Partum Visit Note  Veronica Moore is a 25 y.o. [phone] female who presents for a postpartum visit. She is 6 week postpartum following a primary cesarean section.  I have fully reviewed the prenatal and intrapartum course. The delivery was at 39 gestational weeks.  Anesthesia: spinal. Postpartum course has been complicated by wound/incisional infection. Baby is doing well. Baby is feeding by breast. Bleeding no bleeding. Bowel function is normal. Bladder function is normal. Patient is sexually active. She returned to work from home after 4 weeks postpartum. Contraception method is IUD. Postpartum depression screening: positive.   The pregnancy intention screening data noted above was reviewed. Potential methods of contraception were discussed. The patient elected to proceed with No data recorded.   Edinburgh Postnatal Depression Scale - 09/11/21 1358       Edinburgh Postnatal Depression Scale:  In the Past 7 Days   I have been able to laugh and see the funny side of things. 0    I have looked forward with enjoyment to things. 0    I have blamed myself unnecessarily when things went wrong. 0    I have been anxious or worried for no good reason. 3    I have felt scared or panicky for no good reason. 2    Things have been getting on top of me. 3    I have been so unhappy that I have had difficulty sleeping. 3    I have felt sad or miserable. 0    I have been so unhappy that I have been crying. 2    The thought of harming myself has occurred to me. 0    Edinburgh Postnatal Depression Scale Total 13             Health Maintenance Due  Topic Date Due   HPV VACCINES (3 - 2-dose series) 12/09/2007   COVID-19 Vaccine (3 - Booster for Pfizer series) 06/19/2021   INFLUENZA VACCINE  06/30/2021    The following portions of the patient's history were reviewed and updated as appropriate: allergies, current medications, past family history, past medical history, past social history, past  surgical history, and problem list.  Review of Systems Constitutional: negative Eyes: negative Ears, nose, mouth, throat, and face: negative Respiratory: negative Cardiovascular: negative Gastrointestinal: negative Genitourinary:negative Integument/breast: negative Hematologic/lymphatic: negative Musculoskeletal:negative Neurological: negative Behavioral/Psych: positive for "intrusive" thoughts daily (can play out an entire traumatic scenario in her mind while also not being aware of her surroundings during the intrusive thoughts) Endocrine: negative Allergic/Immunologic: negative  Objective:  BP 116/72 (BP Location: Left Arm, Patient Position: Sitting, Cuff Size: Normal)   Pulse 73   Temp 97.9 F (36.6 C) (Oral)   Wt 134 lb 9.6 oz (61.1 kg)   LMP 09/11/2021 (Within Days)   Breastfeeding Yes   BMI 26.29 kg/m    General:  alert, cooperative, appears stated age, and no distress   Breasts:  normal  Lungs: clear to auscultation bilaterally  Heart:  regular rate and rhythm, S1, S2 normal, no murmur, click, rub or gallop  Abdomen: soft, non-tender; bowel sounds normal; no masses,  no organomegaly   Wound well approximated incision, healing well now after abx therapy  GU exam:  normal       Assessment:   Encounter for postpartum care of lactating mother - Normal postpartum exam.   Encounter for post surgical wound check  Encounter for surveillance of contraceptive device  - IUD strings trimmed to 3 cm without difficulty  Plan:   Essential components of care per ACOG recommendations:  1.  Mood and well being: Patient with positive depression screening today. Reviewed local resources for support. >>evaluation by Gwyndolyn Saxon, LCSW today - Patient tobacco use? No.   - hx of drug use? No.    2. Infant care and feeding:  -Patient currently breastmilk feeding? Yes. Discussed returning to work and pumping. Reviewed importance of draining breast regularly to support  lactation.  -Social determinants of health (SDOH) reviewed in EPIC. No concerns  3. Sexuality, contraception and birth spacing - Patient does not want a pregnancy in the next year.  Desired family size is 2 children.  - Reviewed forms of contraception in tiered fashion. Patient desired IUD today.   - Discussed birth spacing of 18 months  4. Sleep and fatigue -Encouraged family/partner/community support of 4 hrs of uninterrupted sleep to help with mood and fatigue  5. Physical Recovery  - Discussed patients delivery and complications. She describes her labor as good. - Patient had a C-section repeat; no problems after deliver. Perineal healing reviewed. Patient expressed understanding - Patient has urinary incontinence? No. - Patient is safe to resume physical and sexual activity  6.  Health Maintenance - HM due items addressed Yes - Last pap smear  Diagnosis  Date Value Ref Range Status  01/02/2021 (A)  Final   - Atypical squamous cells of undetermined significance (ASC-US)   Pap smear not done at today's visit.  -Breast Cancer screening indicated? No.   7. Chronic Disease/Pregnancy Condition follow up:  anxiety/depression - Has a therapist in [place], [place]; will schedule with her soon  - PCP follow up  Raelyn Mora, CNM [facility] [employer], [facility]

## 2021-09-15 ENCOUNTER — Encounter: Payer: Self-pay | Admitting: Obstetrics and Gynecology

## 2021-09-15 NOTE — Progress Notes (Signed)
GYNECOLOGY OFFICE VISIT NOTE  History:  25 y.o. [id number] here today for IUD string check. Liletta IUD was placed immediate post placental during a RCS. She denies any abnormal vaginal discharge, pelvic pain or other concerns. Having some spotting/bleeding. No dyspareunia.   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)    Bipolar 1 disorder, depressed (HCC)    History of postpartum hemorrhage, currently pregnant    PTSD (post-traumatic stress disorder)    Suicide threat or attempt    history of SI and history of attempt 2015    Past Surgical History:  Procedure Laterality Date   CESAREAN SECTION N/A 07/28/2021   Procedure: CESAREAN SECTION;  Surgeon: Venora Maples, MD;  Location: [facility] LD ORS;  Service: Obstetrics;  Laterality: N/A;   NO PAST SURGERIES      The following portions of the patient's history were reviewed and updated as appropriate: allergies, current medications, past family history, past medical history, past social history, past surgical history and problem list.   Review of Systems:  Pertinent items noted in HPI and remainder of comprehensive ROS otherwise negative.  Objective:  Physical Exam BP 116/72 (BP Location: Left Arm, Patient Position: Sitting, Cuff Size: Normal)   Pulse 73   Temp 97.9 F (36.6 C) (Oral)   Wt 134 lb 9.6 oz (61.1 kg)   LMP 09/11/2021 (Within Days)   Breastfeeding Yes   BMI 26.29 kg/m  CONSTITUTIONAL: Well-developed, well-nourished female in no acute distress.  HENT:  Normocephalic, atraumatic.  SKIN: Skin is warm and dry. No rash noted. Not diaphoretic. No erythema. No pallor. NEUROLOGIC: Alert and oriented to person, place, and time.  PSYCHIATRIC: Normal mood and affect. Normal behavior. Normal judgment and thought content. CARDIOVASCULAR: Normal heart rate noted RESPIRATORY: Effort and rate  normal ABDOMEN: Soft, no distention noted.   PELVIC: Normal appearing external genitalia; normal appearing vaginal mucosa and cervix. IUD string trimmed to 3 cm. No abnormal discharge noted.    Assessment & Plan:  1. Encounter for surveillance of contraceptive device   Please refer to After Visit Summary for other counseling recommendations.   Return in about 1 year (around 09/11/2022) for Annual Exam.   Raelyn Mora, CNM 09/11/2021

## 2022-05-05 ENCOUNTER — Encounter: Payer: Self-pay | Admitting: *Deleted

## 2022-08-17 ENCOUNTER — Inpatient Hospital Stay (HOSPITAL_COMMUNITY)
Admission: AD | Admit: 2022-08-17 | Discharge: 2022-08-17 | Disposition: A | Discharge status: Home / Self Care | Payer: Medicaid Other | Attending: Obstetrics & Gynecology | Admitting: Obstetrics & Gynecology

## 2022-08-17 ENCOUNTER — Other Ambulatory Visit: Payer: Self-pay

## 2022-08-17 ENCOUNTER — Encounter (HOSPITAL_COMMUNITY): Payer: Self-pay | Admitting: Obstetrics & Gynecology

## 2022-08-17 ENCOUNTER — Telehealth: Payer: Self-pay | Admitting: Obstetrics and Gynecology

## 2022-08-17 DIAGNOSIS — N920 Excessive and frequent menstruation with regular cycle: Secondary | ICD-10-CM | POA: Diagnosis not present

## 2022-08-17 DIAGNOSIS — Z3202 Encounter for pregnancy test, result negative: Secondary | ICD-10-CM | POA: Diagnosis present

## 2022-08-17 DIAGNOSIS — Z3200 Encounter for pregnancy test, result unknown: Secondary | ICD-10-CM

## 2022-08-17 LAB — HCG, QUANTITATIVE, PREGNANCY: hCG, Beta Chain, Quant, S: 1 m[IU]/mL (ref <5)

## 2022-08-17 LAB — POCT PREGNANCY, URINE
Preg Test, Ur: NEGATIVE
Preg Test, Ur: NEGATIVE

## 2022-08-17 NOTE — MAU Provider Note (Signed)
Event Date/Time   First Provider Initiated Contact with Patient 08/17/22 1623      S Ms. Veronica Moore is a 26 y.o. [phone] patient who presents to MAU today with complaint of spotting that started this AM 1 positive and 1 negative HPT. She has an IUD that was placed post-placentally during RCS in 06/2021. She has not had any VB since insertion. She reports the spotting since yesterday morning. She states the spotting is lighter today; only with wiping. She is concerned that she might be pregnant, because she and her babies' father have been "off again on again" for 6 months. She has a h/o ectopic pregnancy, so she is in fear of that happening this time with an IUD in place. She also reports she "may desire" to have one more baby, so she would like her IUD removed in the near future.   O BP 122/89 (BP Location: Right Arm)   Pulse 76   Temp 98.9 F (37.2 C) (Oral)   Resp 20   SpO2 100% Comment: room air Physical Exam Vitals and nursing note reviewed.  Constitutional:      Appearance: Normal appearance. She is normal weight.  Cardiovascular:     Rate and Rhythm: Normal rate.  Pulmonary:     Effort: Pulmonary effort is normal.  Genitourinary:    Comments: Not indicated Skin:    General: Skin is warm and dry.  Neurological:     Mental Status: She is alert and oriented to person, place, and time.  Psychiatric:        Mood and Affect: Mood normal.        Behavior: Behavior normal.        Thought Content: Thought content normal.        Judgment: Judgment normal.    A Medical screening exam complete Possible pregnancy - Plan: Discharge patient  Spotting   P Discharge from MAU in stable condition Will call with HCG results List of options for follow-up given  Advised to call [facility] to schedule IUD removal with Laury Deep, CNM Warning signs for worsening condition that would warrant emergency follow-up discussed Patient may return to MAU as needed for pregnancy related  complaints Patient verbalized an understanding of the plan of care and agrees.   Laury Deep, CNM 08/17/2022 4:23 PM

## 2022-08-17 NOTE — MAU Note (Addendum)
Veronica Moore is a 26 y.o. here in MAU reporting: had IUD placed during c/s on 06/2021. Started spotting yesterday morning and today it is lighter and only when she wipes. States she has not had any bleeding with her IUD until now. Mild abdominal cramping but states this is not a new problem. 1 positive and 1 negative UPT at home  LMP: none  Onset of complaint: yesterday  Pain score: 0/10  Vitals:   08/17/22 1613  BP: 122/89  Pulse: 76  Resp: 20  Temp: 98.9 F (37.2 C)  SpO2: 100%     Lab orders placed from triage: upt, ua

## 2022-08-17 NOTE — Telephone Encounter (Signed)
TC to notify of negative HCG results. Reassurance given that the (+) HPT was a false positive result. Call [facility] for appointments prn. Patient verbalized an understanding of the plan of care and agrees.   Laury Deep, CNM

## 2023-02-11 HISTORY — PX: ABDOMINOPLASTY: SUR9

## 2023-11-05 ENCOUNTER — Encounter: Payer: Self-pay | Admitting: Obstetrics and Gynecology

## 2023-11-05 ENCOUNTER — Ambulatory Visit (INDEPENDENT_AMBULATORY_CARE_PROVIDER_SITE_OTHER): Payer: Medicaid Other | Admitting: Primary Care

## 2023-11-05 ENCOUNTER — Encounter (INDEPENDENT_AMBULATORY_CARE_PROVIDER_SITE_OTHER): Payer: Self-pay | Admitting: Primary Care

## 2023-11-05 VITALS — BP 117/76 | HR 86 | Resp 99 | Ht 60.0 in | Wt 110.6 lb

## 2023-11-05 DIAGNOSIS — R21 Rash and other nonspecific skin eruption: Secondary | ICD-10-CM

## 2023-11-05 DIAGNOSIS — Z30432 Encounter for removal of intrauterine contraceptive device: Secondary | ICD-10-CM

## 2023-11-05 DIAGNOSIS — Z2821 Immunization not carried out because of patient refusal: Secondary | ICD-10-CM | POA: Diagnosis not present

## 2023-11-05 DIAGNOSIS — N921 Excessive and frequent menstruation with irregular cycle: Secondary | ICD-10-CM | POA: Diagnosis not present

## 2023-11-05 DIAGNOSIS — Z7689 Persons encountering health services in other specified circumstances: Secondary | ICD-10-CM | POA: Diagnosis not present

## 2023-11-06 LAB — CBC WITH DIFFERENTIAL/PLATELET
Basophils Absolute: 0 10*3/uL (ref 0.0–0.2)
Basos: 1 %
EOS (ABSOLUTE): 0.1 10*3/uL (ref 0.0–0.4)
Eos: 2 %
Hematocrit: 42.1 % (ref 34.0–46.6)
Hemoglobin: 13.8 g/dL (ref 11.1–15.9)
Immature Grans (Abs): 0 10*3/uL (ref 0.0–0.1)
Immature Granulocytes: 0 %
Lymphocytes Absolute: 1.2 10*3/uL (ref 0.7–3.1)
Lymphs: 23 %
MCH: 30.4 pg (ref 26.6–33.0)
MCHC: 32.8 g/dL (ref 31.5–35.7)
MCV: 93 fL (ref 79–97)
Monocytes Absolute: 0.4 10*3/uL (ref 0.1–0.9)
Monocytes: 8 %
Neutrophils Absolute: 3.4 10*3/uL (ref 1.4–7.0)
Neutrophils: 66 %
Platelets: 270 10*3/uL (ref 150–450)
RBC: 4.54 x10E6/uL (ref 3.77–5.28)
RDW: 11.9 % (ref 11.7–15.4)
WBC: 5.2 10*3/uL (ref 3.4–10.8)

## 2023-11-06 LAB — CMP14+EGFR
ALT: 14 [IU]/L (ref 0–32)
AST: 13 [IU]/L (ref 0–40)
Albumin: 5 g/dL (ref 4.0–5.0)
Alkaline Phosphatase: 63 [IU]/L (ref 44–121)
BUN/Creatinine Ratio: 18 (ref 9–23)
BUN: 13 mg/dL (ref 6–20)
Bilirubin Total: 0.3 mg/dL (ref 0.0–1.2)
CO2: 24 mmol/L (ref 20–29)
Calcium: 10.1 mg/dL (ref 8.7–10.2)
Chloride: 104 mmol/L (ref 96–106)
Creatinine, Ser: 0.73 mg/dL (ref 0.57–1.00)
Globulin, Total: 2.9 g/dL (ref 1.5–4.5)
Glucose: 83 mg/dL (ref 70–99)
Potassium: 4.6 mmol/L (ref 3.5–5.2)
Sodium: 142 mmol/L (ref 134–144)
Total Protein: 7.9 g/dL (ref 6.0–8.5)
eGFR: 114 mL/min/{1.73_m2} (ref >59)

## 2023-12-22 ENCOUNTER — Ambulatory Visit: Payer: Medicaid Other | Admitting: Obstetrics and Gynecology
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
## 2024-01-05 ENCOUNTER — Other Ambulatory Visit (HOSPITAL_COMMUNITY)
Admission: RE | Admit: 2024-01-05 | Discharge: 2024-01-05 | Disposition: A | Discharge status: Home / Self Care | Payer: Medicaid Other | Source: Ambulatory Visit | Attending: Physician Assistant | Admitting: Physician Assistant

## 2024-01-05 ENCOUNTER — Ambulatory Visit: Payer: Medicaid Other | Admitting: Physician Assistant

## 2024-01-05 ENCOUNTER — Encounter: Payer: Self-pay | Admitting: Physician Assistant

## 2024-01-05 VITALS — BP 109/76 | HR 63 | Wt 109.6 lb

## 2024-01-05 DIAGNOSIS — N898 Other specified noninflammatory disorders of vagina: Secondary | ICD-10-CM

## 2024-01-05 DIAGNOSIS — A6000 Herpesviral infection of urogenital system, unspecified: Secondary | ICD-10-CM

## 2024-01-05 DIAGNOSIS — Z3202 Encounter for pregnancy test, result negative: Secondary | ICD-10-CM

## 2024-01-05 DIAGNOSIS — Z124 Encounter for screening for malignant neoplasm of cervix: Secondary | ICD-10-CM

## 2024-01-05 DIAGNOSIS — Z3049 Encounter for surveillance of other contraceptives: Secondary | ICD-10-CM | POA: Diagnosis not present

## 2024-01-05 LAB — POCT URINE PREGNANCY: Preg Test, Ur: NEGATIVE

## 2024-01-05 NOTE — Progress Notes (Signed)
 Pt presents for IUD check. Says she has had 3 days of brown tissue like discharge that stops for 3 days then comes back. Had IUD placed with last delivery of child.

## 2024-01-06 LAB — CERVICOVAGINAL ANCILLARY ONLY
Bacterial Vaginitis (gardnerella): NEGATIVE
Candida Glabrata: NEGATIVE
Candida Vaginitis: NEGATIVE
Chlamydia: NEGATIVE
Comment: NEGATIVE
Comment: NEGATIVE
Comment: NEGATIVE
Comment: NEGATIVE
Comment: NEGATIVE
Comment: NORMAL
Neisseria Gonorrhea: NEGATIVE
Trichomonas: NEGATIVE

## 2024-01-07 ENCOUNTER — Encounter: Payer: Self-pay | Admitting: Physician Assistant

## 2024-01-07 ENCOUNTER — Other Ambulatory Visit: Payer: Self-pay | Admitting: Physician Assistant

## 2024-01-07 DIAGNOSIS — N898 Other specified noninflammatory disorders of vagina: Secondary | ICD-10-CM

## 2024-01-12 ENCOUNTER — Encounter: Payer: Self-pay | Admitting: Physician Assistant

## 2024-01-12 LAB — CYTOLOGY - PAP: Diagnosis: NEGATIVE

## 2024-02-04 ENCOUNTER — Ambulatory Visit (HOSPITAL_BASED_OUTPATIENT_CLINIC_OR_DEPARTMENT_OTHER): Payer: Medicaid Other

## 2024-03-22 ENCOUNTER — Ambulatory Visit (INDEPENDENT_AMBULATORY_CARE_PROVIDER_SITE_OTHER): Admitting: Primary Care

## 2024-05-10 ENCOUNTER — Ambulatory Visit (INDEPENDENT_AMBULATORY_CARE_PROVIDER_SITE_OTHER): Payer: Medicaid Other | Admitting: Primary Care

## 2024-05-26 ENCOUNTER — Ambulatory Visit: Payer: Self-pay

## 2024-05-26 NOTE — Telephone Encounter (Signed)
 FYI Only or Action Required?: FYI only for provider.  Patient was last seen in primary care on 11/05/2023 by Celestia Rosaline SQUIBB, NP. Called Nurse Triage reporting Migraine. Symptoms began several months ago. Interventions attempted: OTC medications: See note . Symptoms are: stable.  Triage Disposition: See PCP Within 2 Weeks- Scheduled   Patient/caregiver understands and will follow disposition?: Yes       Copied from CRM [id number]. Topic: Clinical - Red Word Triage >> May 26, 2024  9:42 AM Tobias CROME wrote: Red Word that prompted transfer to Nurse Triage: persistent migraines, seen at [facility] still having migraines Answer Assessment - Initial Assessment Questions 1. LOCATION: Where does it hurt?      ------- Anterior and posterior of her head.    2. ONSET: When did the headache start? (Minutes, hours or days)      --- 2 days ago ( Light headache.. not migraine). Dissipated, but lingering pressure     3. PATTERN: Does the pain come and go, or has it been constant since it started?     ---    4. SEVERITY: How bad is the pain? and What does it keep you from doing?  (e.g., Scale 1-10; mild, moderate, or severe)   - MILD (1-3): doesn't interfere with normal activities    - MODERATE (4-7): interferes with normal activities or awakens from sleep    - SEVERE (8-10): excruciating pain, unable to do any normal activities        ------------- Denies     5. RECURRENT SYMPTOM: Have you ever had headaches before? If Yes, ask: When was the last time? and What happened that time?      --------------- Yes    6. CAUSE: What do you think is causing the headache?     ---------------   7. MIGRAINE: Have you been diagnosed with migraine headaches? If Yes, ask: Is this headache similar?      ------------------- Yes, migraines w/ auras       9. OTHER SYMPTOMS: Do you have any other symptoms? (fever, stiff neck, eye pain, sore throat, cold symptoms) ---Nausea  --------    10. PREGNANCY: Is there any chance you are pregnant? When was your last menstrual period?       -------------------- Denies    Additional Infor: Feb: ER: DX w/ Migraines March: UC: Migraines: Prescribed Ubrelvy? - Unable d/t cost Attempts: Excedrin, Acetaminophen - No relief Acetaminophen + Ibuprofen = Some relief   Auras: Blurred vision and peripheral dark areas in the corner of eyes  Protocols used: Headache-A-AH  Reason for Disposition  Headache is a chronic symptom (recurrent or ongoing AND present > 4 weeks)  Answer Assessment - Initial Assessment Questions 1. LOCATION: Where does it hurt?      ------- Anterior and posterior of her head.    2. ONSET: When did the headache start? (Minutes, hours or days)      --- 2 days ago ( Light headache.. not migraine). Dissipated, but lingering pressure     3. PATTERN: Does the pain come and go, or has it been constant since it started?     ---    4. SEVERITY: How bad is the pain? and What does it keep you from doing?  (e.g., Scale 1-10; mild, moderate, or severe)   - MILD (1-3): doesn't interfere with normal activities    - MODERATE (4-7): interferes with normal activities or awakens from sleep    - SEVERE (8-10): excruciating pain, unable to do any normal activities        -------------  Denies     5. RECURRENT SYMPTOM: Have you ever had headaches before? If Yes, ask: When was the last time? and What happened that time?      --------------- Yes    6. CAUSE: What do you think is causing the headache?     ---------------   7. MIGRAINE: Have you been diagnosed with migraine headaches? If Yes, ask: Is this headache similar?      ------------------- Yes, migraines w/ auras       9. OTHER SYMPTOMS: Do you have any other symptoms? (fever, stiff neck, eye pain, sore throat, cold symptoms) ---Nausea  --------   10. PREGNANCY: Is there any chance you are pregnant? When was your last menstrual  period?       -------------------- Denies    Additional Infor: Feb: ER: DX w/ Migraines March: UC: Migraines: Prescribed Ubrelvy? - Unable d/t cost Attempts: Excedrin, Acetaminophen - No relief Acetaminophen + Ibuprofen = Some relief   Auras: Blurred vision and peripheral dark areas in the corner of eyes  Protocols used: [facility]

## 2024-05-26 NOTE — Telephone Encounter (Signed)
 Advised on MU options M-Th.  Advised UC/ ED if sxs progress or fail to improve.  Patient verbalized understanding and stated that  appointment given was good.

## 2024-06-12 ENCOUNTER — Ambulatory Visit (INDEPENDENT_AMBULATORY_CARE_PROVIDER_SITE_OTHER): Payer: Self-pay | Admitting: Primary Care

## 2024-06-12 VITALS — BP 125/83 | HR 81 | Resp 16 | Ht 60.0 in | Wt 111.0 lb

## 2024-06-12 DIAGNOSIS — G43109 Migraine with aura, not intractable, without status migrainosus: Secondary | ICD-10-CM

## 2024-06-12 MED ORDER — SUMATRIPTAN SUCCINATE 25 MG PO TABS: 25.0000 mg | ORAL_TABLET | ORAL | 1 refills | Status: DC | PRN

## 2024-06-12 NOTE — Progress Notes (Signed)
 [facility]  Veronica Moore, is a 28 y.o. female  RDW:[id number]  FMW:[id number]  DOB - 05-Apr-1996  Headaches  3000      Subjective:   Veronica Moore is a 28 y.o. female here today for  an acute visit.  Recently she has been experiencing more frontal and occipital pain that causes patient changes, feeling as though she is going to pass out, she has actually had episodes where she did pass out and nausea.  The onset is slow but happens quickly to the point she is not able to get out of bed.  Migraine  This is a recurrent problem. The current episode started more than 1 month ago. The problem occurs intermittently. The problem has been gradually worsening. The pain is located in the Occipital and frontal region. Radiates to: feels like she has the flu. The pain quality is similar to prior headaches. The quality of the pain is described as pulsating, aching and throbbing. The pain is at a severity of 10/10. The pain is severe. Associated symptoms include abdominal pain, blurred vision, dizziness, ear pain, insomnia, nausea, photophobia and scalp tenderness. Exacerbated by: radom. She has tried darkened room and NSAIDs for the symptoms. The treatment provided no relief. Her past medical history is significant for migraine headaches and migraines in the family.    No problems updated.  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   No current facility-administered medications on file prior to  visit.   Health Maintenance  Topic Date Due   Hepatitis B Vaccine (1 of 3 - 19+ 3-dose series) Never done   COVID-19 Vaccine (4 - 2024-25 season) 08/01/2023   HPV Vaccine (3 - 2-dose series) 11/04/2024*   Flu Shot  06/30/2024   Pap Smear  01/04/2027   DTaP/Tdap/Td vaccine (4 - Td or Tdap) 05/17/2031   Hepatitis C Screening  Completed   HIV Screening  Completed   Meningitis B Vaccine  Aged Out  *Topic was postponed. The date shown is not the original due date.    BP 125/83   Pulse 81   Resp 16   Ht 5' (1.524 m)   Wt 111 lb (50.3 kg)   SpO2 100%   BMI 21.68 kg/m    Physical Exam Vitals reviewed.  Constitutional:      Appearance: Normal appearance. She is normal weight.  HENT:     Head: Normocephalic.     Right Ear: Tympanic membrane, ear canal and external ear normal.     Left Ear: Tympanic membrane, ear canal and external ear normal.     Nose: Nose normal.     Mouth/Throat:     Mouth: Mucous membranes are moist.  Eyes:     Extraocular Movements: Extraocular movements intact.     Pupils: Pupils are equal, round, and reactive to light.  Cardiovascular:     Rate and Rhythm: Normal rate.  Pulmonary:     Effort: Pulmonary effort is  normal.     Breath sounds: Normal breath sounds.  Abdominal:     General: Bowel sounds are normal.     Palpations: Abdomen is soft.  Musculoskeletal:        General: Normal range of motion.     Cervical back: Normal range of motion.  Skin:    General: Skin is warm and dry.  Neurological:     Mental Status: She is alert and oriented to person, place, and time.  Psychiatric:        Mood and Affect: Mood normal.        Behavior: Behavior normal.        Thought Content: Thought content normal.      Assessment & Plan  Veronica Moore was seen today for migraine.  Diagnoses and all orders for this visit:  Migraine with aura and without status migrainosus, not intractable -     Ambulatory referral to Neurology SUMAtriptan  (IMITREX ) 25 MG  tablet       Patient have been counseled extensively about nutrition and exercise. Other issues discussed during this visit include: low cholesterol diet, weight control and daily exercise, foot care, annual eye examinations at Ophthalmology, importance of adherence with medications and regular follow-up. We also discussed long term complications of uncontrolled diabetes and hypertension.   Return in about 6 weeks (around 07/24/2024) for migraine tx.  The patient was given clear instructions to go to ER or return to medical center if symptoms don't improve, worsen or new problems develop. The patient verbalized understanding. The patient was told to call to get lab results if they haven't heard anything in the next week.   This note has been created with Education officer, environmental. Any transcriptional errors are unintentional.   Veronica SHAUNNA Bohr, NP 06/12/2024, 3:12 PM

## 2024-06-30 ENCOUNTER — Encounter: Payer: Self-pay | Admitting: Neurology
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
## 2024-09-15 ENCOUNTER — Ambulatory Visit: Admitting: Neurology

## 2024-09-15 ENCOUNTER — Other Ambulatory Visit

## 2024-09-15 ENCOUNTER — Encounter: Payer: Self-pay | Admitting: Neurology

## 2024-09-15 VITALS — BP 110/73 | HR 86 | Ht 60.0 in | Wt 112.0 lb

## 2024-09-15 DIAGNOSIS — Z72820 Sleep deprivation: Secondary | ICD-10-CM | POA: Diagnosis not present

## 2024-09-15 DIAGNOSIS — R5383 Other fatigue: Secondary | ICD-10-CM

## 2024-09-15 DIAGNOSIS — G43109 Migraine with aura, not intractable, without status migrainosus: Secondary | ICD-10-CM | POA: Diagnosis not present

## 2024-09-15 MED ORDER — SUMATRIPTAN SUCCINATE 100 MG PO TABS: 100.0000 mg | ORAL_TABLET | Freq: Once | ORAL | 5 refills | Status: AC | PRN

## 2024-09-15 MED ORDER — TOPIRAMATE 25 MG PO TABS: 25.0000 mg | ORAL_TABLET | Freq: Every day | ORAL | 5 refills | Status: AC
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
## 2024-09-20 ENCOUNTER — Ambulatory Visit: Payer: Self-pay | Admitting: Neurology

## 2024-09-20 LAB — VITAMIN B1: Vitamin B1 (Thiamine): 14 nmol/L (ref 8–30)

## 2024-09-20 LAB — TSH: TSH: 1.09 m[IU]/L

## 2024-09-20 LAB — VITAMIN D 25 HYDROXY (VIT D DEFICIENCY, FRACTURES): Vit D, 25-Hydroxy: 38 ng/mL (ref 30–100)

## 2024-10-04 ENCOUNTER — Encounter: Payer: Self-pay | Admitting: Neurology

## 2024-10-18 ENCOUNTER — Ambulatory Visit
Admission: RE | Admit: 2024-10-18 | Discharge: 2024-10-18 | Disposition: A | Source: Ambulatory Visit | Attending: Neurology

## 2024-10-18 DIAGNOSIS — G43109 Migraine with aura, not intractable, without status migrainosus: Secondary | ICD-10-CM

## 2024-10-18 MED ORDER — GADOPICLENOL 0.5 MMOL/ML IV SOLN
5.0000 mL | Freq: Once | INTRAVENOUS | Status: AC | PRN
  Administered 2024-10-18: 5 mL via INTRAVENOUS

## 2024-10-23 ENCOUNTER — Encounter: Payer: Self-pay | Admitting: Neurology

## 2024-12-29 ENCOUNTER — Ambulatory Visit: Admitting: Neurology

## 2024-12-29 ENCOUNTER — Encounter: Payer: Self-pay | Admitting: Neurology

## 2024-12-29 VITALS — BP 119/77 | HR 80 | Ht 60.0 in | Wt 106.0 lb

## 2024-12-29 DIAGNOSIS — Z72820 Sleep deprivation: Secondary | ICD-10-CM

## 2024-12-29 DIAGNOSIS — G43109 Migraine with aura, not intractable, without status migrainosus: Secondary | ICD-10-CM

## 2024-12-29 NOTE — Patient Instructions (Addendum)
 Migraine prevention:  Continue Topamax  25 mg daily Migraine rescue:  Continue Sumatriptan  100 mg as needed at headache onset, can repeat in 2 hours if needed. Zofran  as needed for nausea Limit use of pain relievers to no more than 2 days out of week to prevent risk of rebound or medication-overuse headache. Keep headache diary   Return to clinic in 6 months  Please let me know if you have any questions or concerns in the meantime.  The physicians and staff at [facility] are committed to providing excellent care. You may receive a survey requesting feedback about your experience at our office. We strive to receive very good responses to the survey questions. If you feel that your experience would prevent you from giving the office a very good  response, please contact our office to try to remedy the situation. We may be reached at [phone]. Thank you for taking the time out of your busy day to complete the survey.  Venetia Potters, MD [facility]

## 2025-07-25 ENCOUNTER — Ambulatory Visit: Payer: Self-pay | Admitting: Neurology
